# Patient Record
Sex: Female | Born: 1935 | Race: White | Hispanic: No | Marital: Married | Smoking: Never smoker
Health system: Southern US, Community
[De-identification: ages and names within clinical notes are randomized; demographics above are authoritative.]

## PROBLEM LIST (undated history)

## (undated) DIAGNOSIS — G8929 Other chronic pain: Secondary | ICD-10-CM

## (undated) DIAGNOSIS — M810 Age-related osteoporosis without current pathological fracture: Secondary | ICD-10-CM

## (undated) DIAGNOSIS — E785 Hyperlipidemia, unspecified: Secondary | ICD-10-CM

## (undated) DIAGNOSIS — E109 Type 1 diabetes mellitus without complications: Secondary | ICD-10-CM

## (undated) DIAGNOSIS — I6529 Occlusion and stenosis of unspecified carotid artery: Secondary | ICD-10-CM

## (undated) DIAGNOSIS — N183 Chronic kidney disease, stage 3 unspecified: Secondary | ICD-10-CM

## (undated) DIAGNOSIS — I2510 Atherosclerotic heart disease of native coronary artery without angina pectoris: Secondary | ICD-10-CM

## (undated) DIAGNOSIS — E039 Hypothyroidism, unspecified: Secondary | ICD-10-CM

## (undated) DIAGNOSIS — I10 Essential (primary) hypertension: Secondary | ICD-10-CM

## (undated) HISTORY — PX: TRIGGER FINGER RELEASE: SHX641

## (undated) HISTORY — PX: CORONARY ARTERY BYPASS GRAFT: SHX141

---

## 2017-09-16 ENCOUNTER — Ambulatory Visit (INDEPENDENT_AMBULATORY_CARE_PROVIDER_SITE_OTHER): Payer: Medicare Other | Admitting: Cardiovascular Disease

## 2017-09-16 ENCOUNTER — Encounter: Payer: Self-pay | Admitting: Cardiovascular Disease

## 2017-09-16 VITALS — BP 132/54 | HR 54 | Ht 62.5 in | Wt 121.0 lb

## 2017-09-16 DIAGNOSIS — E119 Type 2 diabetes mellitus without complications: Secondary | ICD-10-CM | POA: Diagnosis not present

## 2017-09-16 DIAGNOSIS — E7800 Pure hypercholesterolemia, unspecified: Secondary | ICD-10-CM

## 2017-09-16 DIAGNOSIS — Z794 Long term (current) use of insulin: Secondary | ICD-10-CM

## 2017-09-16 DIAGNOSIS — I2510 Atherosclerotic heart disease of native coronary artery without angina pectoris: Secondary | ICD-10-CM

## 2017-09-16 DIAGNOSIS — E785 Hyperlipidemia, unspecified: Secondary | ICD-10-CM | POA: Diagnosis not present

## 2017-09-16 DIAGNOSIS — I10 Essential (primary) hypertension: Secondary | ICD-10-CM | POA: Diagnosis not present

## 2017-09-16 DIAGNOSIS — E109 Type 1 diabetes mellitus without complications: Secondary | ICD-10-CM | POA: Insufficient documentation

## 2017-09-16 DIAGNOSIS — IMO0001 Reserved for inherently not codable concepts without codable children: Secondary | ICD-10-CM

## 2017-09-16 NOTE — Assessment & Plan Note (Signed)
She has CAD status post bypass grafting x325 years ago in [place] without problems since.  She denies chest pain or shortness of breath.

## 2017-09-16 NOTE — Assessment & Plan Note (Signed)
History of hyperlipidemia on statin therapy. We will recheck a lipid and liver profile 

## 2017-09-16 NOTE — Assessment & Plan Note (Signed)
History of essential hypertension blood pressure measured today 132/54.  Is on losartan, metoprolol and nifedipine.  Continue current meds at current dosing

## 2017-09-16 NOTE — Progress Notes (Signed)
09/16/2017 [name]   07-10-1929  [id number]  Primary Physician Robyne Peers, MD Primary Cardiologist: Lorretta Harp MD Lupe Carney, [place]  HPI:  Nancy Mcguire is a 84 y.o. mildly overweight married Caucasian female mother of 3 daughters, grandmother of 30 grandchildren is accompanied by her husband Nancy Mcguire.  She is self-referred to be established in my practice because of known CAD.  She has a history of treated hypertension, hyperlipidemia and diabetes on insulin pump.  She had bypass grafting x3 in [place] 25 years ago and has had no issues with that since.  She moved from [place] to [place] 5 years ago and now is here to be established.  She is completely asymptomatic.   Current Meds  Medication Sig  . acetaminophen (TYLENOL) 500 MG tablet Take 500 mg by mouth every 6 (six) hours as needed.  . calcium carbonate (CALCIUM 600) 600 MG TABS tablet Take 600 mg by mouth daily with breakfast.  . Cholecalciferol (VITAMIN D3 PO) Take 50 mcg by mouth daily.  . Cyanocobalamin (B-12) 1000 MCG/ML KIT Inject 1,000 Units as directed daily.  Marland Kitchen denosumab (PROLIA) 60 MG/ML SOSY injection Inject 60 mg into the skin every 6 (six) months.  . folic acid (FOLVITE) 644 MCG tablet Take 400 mcg by mouth daily.  Marland Kitchen gabapentin (NEURONTIN) 100 MG capsule Take 100 mg by mouth 2 (two) times daily.  Marland Kitchen levothyroxine (SYNTHROID, LEVOTHROID) 50 MCG tablet Take 50 mcg by mouth daily before breakfast.  . losartan (COZAAR) 100 MG tablet Take 100 mg by mouth daily.  . metoprolol tartrate (LOPRESSOR) 50 MG tablet Take 50 mg by mouth 2 (two) times daily.  Marland Kitchen NIFEdipine (PROCARDIA-XL/ADALAT CC) 60 MG 24 hr tablet Take 60 mg by mouth daily.  . simvastatin (ZOCOR) 20 MG tablet Take 20 mg by mouth daily.  . vitamin E 400 UNIT capsule Take 400 Units by mouth daily.     Allergies  Allergen Reactions  . Neomycin-Bacitracin Zn-Polymyx Other (See Comments)    Eye irritation Eye irritation   .  Hydrocodone-Acetaminophen Nausea And Vomiting    Social History   Socioeconomic History  . Marital status: Married    Spouse name: Not on file  . Number of children: Not on file  . Years of education: Not on file  . Highest education level: Not on file  Occupational History  . Not on file  Social Needs  . Financial resource strain: Not on file  . Food insecurity:    Worry: Not on file    Inability: Not on file  . Transportation needs:    Medical: Not on file    Non-medical: Not on file  Tobacco Use  . Smoking status: Not on file  Substance and Sexual Activity  . Alcohol use: Not on file  . Drug use: Not on file  . Sexual activity: Not on file  Lifestyle  . Physical activity:    Days per week: Not on file    Minutes per session: Not on file  . Stress: Not on file  Relationships  . Social connections:    Talks on phone: Not on file    Gets together: Not on file    Attends religious service: Not on file    Active member of club or organization: Not on file    Attends meetings of clubs or organizations: Not on file    Relationship status: Not on file  . Intimate partner violence:    Fear of  current or ex partner: Not on file    Emotionally abused: Not on file    Physically abused: Not on file    Forced sexual activity: Not on file  Other Topics Concern  . Not on file  Social History Narrative  . Not on file     Review of Systems: General: negative for chills, fever, night sweats or weight changes.  Cardiovascular: negative for chest pain, dyspnea on exertion, edema, orthopnea, palpitations, paroxysmal nocturnal dyspnea or shortness of breath Dermatological: negative for rash Respiratory: negative for cough or wheezing Urologic: negative for hematuria Abdominal: negative for nausea, vomiting, diarrhea, bright red blood per rectum, melena, or hematemesis Neurologic: negative for visual changes, syncope, or dizziness All other systems reviewed and are otherwise  negative except as noted above.    Blood pressure (!) 132/54, pulse (!) 54, height 5' 2.5" (1.588 m), weight 121 lb (54.9 kg).  General appearance: alert and no distress Neck: no adenopathy, no carotid bruit, no JVD, supple, symmetrical, trachea midline and thyroid not enlarged, symmetric, no tenderness/mass/nodules Lungs: clear to auscultation bilaterally Heart: regular rate and rhythm, S1, S2 normal, no murmur, click, rub or gallop Extremities: extremities normal, atraumatic, no cyanosis or edema Pulses: 2+ and symmetric Skin: Skin color, texture, turgor normal. No rashes or lesions Neurologic: Alert and oriented X 3, normal strength and tone. Normal symmetric reflexes. Normal coordination and gait  EKG sinus bradycardia 54 poor R wave progression.  I personally reviewed this EKG.  ASSESSMENT AND PLAN:   Hyperlipidemia History of hyperlipidemia on statin therapy.  We will recheck a lipid and liver profile  Essential hypertension History of essential hypertension blood pressure measured today 132/54.  Is on losartan, metoprolol and nifedipine.  Continue current meds at current dosing  Coronary artery disease She has CAD status post bypass grafting x325 years ago in [place] without problems since.  She denies chest pain or shortness of breath.      Lorretta Harp MD FACP,FACC,FAHA, [facility] 09/16/2017 12:55 PM

## 2017-09-16 NOTE — Patient Instructions (Signed)

## 2017-09-22 ENCOUNTER — Encounter: Payer: Self-pay | Admitting: *Deleted

## 2017-09-22 LAB — HEPATIC FUNCTION PANEL
ALT: 11 IU/L (ref 0–32)
AST: 15 IU/L (ref 0–40)
Albumin: 4.3 g/dL (ref 3.5–4.7)
Alkaline Phosphatase: 75 IU/L (ref 39–117)
Bilirubin Total: 0.4 mg/dL (ref 0.0–1.2)
Bilirubin, Direct: 0.14 mg/dL (ref 0.00–0.40)
Total Protein: 6.5 g/dL (ref 6.0–8.5)

## 2017-09-22 LAB — LIPID PANEL
CHOL/HDL RATIO: 2.1 ratio (ref 0.0–4.4)
CHOLESTEROL TOTAL: 151 mg/dL (ref 100–199)
HDL: 73 mg/dL (ref >39)
LDL Calculated: 67 mg/dL (ref 0–99)
TRIGLYCERIDES: 53 mg/dL (ref 0–149)
VLDL Cholesterol Cal: 11 mg/dL (ref 5–40)

## 2018-08-30 ENCOUNTER — Telehealth: Payer: Self-pay | Admitting: *Deleted

## 2018-08-30 NOTE — Telephone Encounter (Signed)
Mrs. Nancy Mcguire, sheerin call back later.

## 2018-12-06 ENCOUNTER — Telehealth: Payer: Self-pay | Admitting: Cardiovascular Disease

## 2018-12-06 NOTE — Telephone Encounter (Signed)
New Message   Patient's husband Juanda Crumble) calling in to get approval to accompany patient to the appointment on 12/08/18 with Dr Gwenlyn Found. Please give patient a call back to confirm.

## 2018-12-08 ENCOUNTER — Ambulatory Visit: Payer: Medicare Other | Admitting: Cardiovascular Disease

## 2019-01-04 ENCOUNTER — Other Ambulatory Visit: Payer: Self-pay

## 2019-01-04 ENCOUNTER — Encounter: Payer: Self-pay | Admitting: Cardiovascular Disease

## 2019-01-04 ENCOUNTER — Ambulatory Visit (INDEPENDENT_AMBULATORY_CARE_PROVIDER_SITE_OTHER): Payer: Medicare Other | Admitting: Cardiovascular Disease

## 2019-01-04 DIAGNOSIS — I2510 Atherosclerotic heart disease of native coronary artery without angina pectoris: Secondary | ICD-10-CM | POA: Diagnosis not present

## 2019-01-04 DIAGNOSIS — R0989 Other specified symptoms and signs involving the circulatory and respiratory systems: Secondary | ICD-10-CM | POA: Insufficient documentation

## 2019-01-04 DIAGNOSIS — R011 Cardiac murmur, unspecified: Secondary | ICD-10-CM | POA: Insufficient documentation

## 2019-01-04 DIAGNOSIS — I10 Essential (primary) hypertension: Secondary | ICD-10-CM

## 2019-01-04 DIAGNOSIS — E782 Mixed hyperlipidemia: Secondary | ICD-10-CM | POA: Diagnosis not present

## 2019-01-04 NOTE — Patient Instructions (Addendum)
Medication Instructions:  Your physician recommends that you continue on your current medications as directed. Please refer to the Current Medication list given to you today.  If you need a refill on your cardiac medications before your next appointment, please call your pharmacy.   Lab work: Your physician recommends that you return for lab work WITHIN 1 WEEK: FASTING LAB WORK  If you have labs (blood work) drawn today and your tests are completely normal, you will receive your results only by: Marland Kitchen MyChart Message (if you have MyChart) OR . A paper copy in the mail If you have any lab test that is abnormal or we need to change your treatment, we will call you to review the results.  Testing/Procedures: Your physician has requested that you have an echocardiogram. Echocardiography is a painless test that uses sound waves to create images of your heart. It provides your doctor with information about the size and shape of your heart and how well your heart's chambers and valves are working. This procedure takes approximately one hour. There are no restrictions for this procedure. LOCATION: [facility]: [place], [place], [place] [address] TO BE SCHEDULED   Follow-Up: At [facility], you and your health needs are our priority.  As part of our continuing mission to provide you with exceptional heart care, we have created designated Provider Care Teams.  These Care Teams include your primary Cardiologist (physician) and Advanced Practice Providers (APPs -  Physician Assistants and Nurse Practitioners) who all work together to provide you with the care you need, when you need it. You will need a follow up appointment in 12 months with Dr. Quay Burow.  Please call our office 2 months in advance to schedule this/each appointment.

## 2019-01-04 NOTE — Assessment & Plan Note (Signed)
History of CAD status post coronary artery bypass grafting over 25 years ago in [place].  She remains asymptomatic.

## 2019-01-04 NOTE — Assessment & Plan Note (Signed)
Loud outflow tract murmur.  Will check 2D echocardiogram

## 2019-01-04 NOTE — Progress Notes (Signed)
01/04/2019 Alaa Mullally   05/21/1929  [id number]  Primary Physician Robyne Peers, MD Primary Cardiologist: Lorretta Harp MD Lupe Carney, [place]  HPI:  Trenesha Alcaide is a 85 y.o.   mildly overweight married Caucasian female mother of 3 daughters, grandmother of 33 grandchildren is accompanied by her husband Iona Beard who is also a patient of mine. She was self-referred to be established in my practice because of known CAD.  I last saw her in the office 09/16/2017. She has a history of treated hypertension, hyperlipidemia and diabetes on insulin pump.  She had bypass grafting x3 in [place] 25 years ago and has had no issues with that since.  She moved from [place] to [place] 5 years ago and now is here to be established.  She is completely asymptomatic.  Since I saw her a year ago she is remained asymptomatic.  She gets her carotid Dopplers performed at [facility] which have been essentially normal last checked 09/21/2018.  She denies chest pain or shortness of breath.  Better   Current Meds  Medication Sig  . acetaminophen (TYLENOL) 500 MG tablet Take 500 mg by mouth every 6 (six) hours as needed.  . calcium carbonate (CALCIUM 600) 600 MG TABS tablet Take 600 mg by mouth daily with breakfast.  . Cholecalciferol (VITAMIN D3 PO) Take 50 mcg by mouth daily.  . cyanocobalamin (,VITAMIN B-12,) 1000 MCG/ML injection INJECT MONTHLY  . denosumab (PROLIA) 60 MG/ML SOSY injection Inject 60 mg into the skin every 6 (six) months.  . folic acid (FOLVITE) 409 MCG tablet Take 400 mcg by mouth daily.  Marland Kitchen gabapentin (NEURONTIN) 100 MG capsule Take 100 mg by mouth 2 (two) times daily.  . insulin lispro (HUMALOG) 100 UNIT/ML injection USE AS DIRECTED IN INSULIN PUMP. MDD=50 UNITS  . levothyroxine (SYNTHROID, LEVOTHROID) 50 MCG tablet Take 50 mcg by mouth daily before breakfast.  . losartan (COZAAR) 100 MG tablet Take 100 mg by mouth daily.  . metoprolol tartrate (LOPRESSOR) 50  MG tablet Take 50 mg by mouth 2 (two) times daily.  Marland Kitchen NIFEdipine (PROCARDIA-XL/ADALAT CC) 60 MG 24 hr tablet Take 60 mg by mouth daily.  . simvastatin (ZOCOR) 20 MG tablet Take 20 mg by mouth daily.  . vitamin E 400 UNIT capsule Take 400 Units by mouth daily.     Allergies  Allergen Reactions  . Neomycin-Bacitracin Zn-Polymyx Other (See Comments)    Eye irritation Eye irritation   . Hydrocodone-Acetaminophen Nausea And Vomiting    Social History   Socioeconomic History  . Marital status: Married    Spouse name: Not on file  . Number of children: Not on file  . Years of education: Not on file  . Highest education level: Not on file  Occupational History  . Not on file  Social Needs  . Financial resource strain: Not on file  . Food insecurity    Worry: Not on file    Inability: Not on file  . Transportation needs    Medical: Not on file    Non-medical: Not on file  Tobacco Use  . Smoking status: Never Smoker  . Smokeless tobacco: Never Used  Substance and Sexual Activity  . Alcohol use: Yes  . Drug use: Not on file  . Sexual activity: Not on file  Lifestyle  . Physical activity    Days per week: Not on file    Minutes per session: Not on file  . Stress: Not on  file  Relationships  . Social Musician on phone: Not on file    Gets together: Not on file    Attends religious service: Not on file    Active member of club or organization: Not on file    Attends meetings of clubs or organizations: Not on file    Relationship status: Not on file  . Intimate partner violence    Fear of current or ex partner: Not on file    Emotionally abused: Not on file    Physically abused: Not on file    Forced sexual activity: Not on file  Other Topics Concern  . Not on file  Social History Narrative  . Not on file     Review of Systems: General: negative for chills, fever, night sweats or weight changes.  Cardiovascular: negative for chest pain, dyspnea on  exertion, edema, orthopnea, palpitations, paroxysmal nocturnal dyspnea or shortness of breath Dermatological: negative for rash Respiratory: negative for cough or wheezing Urologic: negative for hematuria Abdominal: negative for nausea, vomiting, diarrhea, bright red blood per rectum, melena, or hematemesis Neurologic: negative for visual changes, syncope, or dizziness All other systems reviewed and are otherwise negative except as noted above.    Blood pressure (!) 166/75, pulse 63, height 5' 2.5" (1.588 m), weight 120 lb 9.6 oz (54.7 kg), SpO2 98 %.  General appearance: alert and no distress Neck: no adenopathy, no JVD, supple, symmetrical, trachea midline, thyroid not enlarged, symmetric, no tenderness/mass/nodules and Soft bilateral carotid bruits Lungs: clear to auscultation bilaterally Heart: 3/6 systolic ejection murmur at the base consistent with aortic stenosis and/or sclerosis. Extremities: extremities normal, atraumatic, no cyanosis or edema Pulses: 2+ and symmetric Skin: Skin color, texture, turgor normal. No rashes or lesions Neurologic: Alert and oriented X 3, normal strength and tone. Normal symmetric reflexes. Normal coordination and gait  EKG sinus bradycardia 59 with nonspecific ST and T wave changes.  I personally reviewed this EKG.  ASSESSMENT AND PLAN:   Hyperlipidemia History of hyperlipidemia on statin therapy.  We will recheck a lipid liver profile.  Essential hypertension History of essential hypertension with blood pressure measured today at 166/75 but she says this is "whitecoat hypertension since usually her blood pressure is much better than this.  Repeat readings was 140/68.Marland Kitchen  She is on losartan and metoprolol.  Coronary artery disease History of CAD status post coronary artery bypass grafting over 25 years ago in [place].  She remains asymptomatic.  Cardiac murmur Loud outflow tract murmur.  Will check 2D echocardiogram  Carotid bruit Bilateral  carotid bruits with recent carotid Dopplers performed 09/21/2018 that were essentially normal      Runell Gess MD [facility], [facility] 01/04/2019 11:41 AM

## 2019-01-04 NOTE — Assessment & Plan Note (Signed)
History of hyperlipidemia on statin therapy.  We will recheck a lipid liver profile 

## 2019-01-04 NOTE — Assessment & Plan Note (Addendum)
History of essential hypertension with blood pressure measured today at 166/75 but she says this is "whitecoat hypertension since usually her blood pressure is much better than this.  Repeat readings was 140/68.Marland Kitchen  She is on losartan and metoprolol.

## 2019-01-04 NOTE — Assessment & Plan Note (Signed)
Bilateral carotid bruits with recent carotid Dopplers performed 09/21/2018 that were essentially normal

## 2019-01-06 LAB — HEPATIC FUNCTION PANEL
ALT: 19 IU/L (ref 0–32)
AST: 24 IU/L (ref 0–40)
Albumin: 4.3 g/dL (ref 3.6–4.6)
Alkaline Phosphatase: 110 IU/L (ref 39–117)
Bilirubin Total: 0.5 mg/dL (ref 0.0–1.2)
Bilirubin, Direct: 0.19 mg/dL (ref 0.00–0.40)
Total Protein: 6.8 g/dL (ref 6.0–8.5)

## 2019-01-06 LAB — LIPID PANEL
Chol/HDL Ratio: 1.9 ratio (ref 0.0–4.4)
Cholesterol, Total: 139 mg/dL (ref 100–199)
HDL: 72 mg/dL (ref >39)
LDL Chol Calc (NIH): 54 mg/dL (ref 0–99)
Triglycerides: 62 mg/dL (ref 0–149)
VLDL Cholesterol Cal: 13 mg/dL (ref 5–40)

## 2019-01-07 ENCOUNTER — Encounter: Payer: Self-pay | Admitting: *Deleted

## 2019-01-13 ENCOUNTER — Ambulatory Visit (HOSPITAL_COMMUNITY): Payer: Medicare Other | Attending: Cardiology

## 2019-01-13 ENCOUNTER — Other Ambulatory Visit: Payer: Self-pay

## 2019-01-13 DIAGNOSIS — R011 Cardiac murmur, unspecified: Secondary | ICD-10-CM | POA: Insufficient documentation

## 2019-08-08 ENCOUNTER — Telehealth: Payer: Self-pay | Admitting: Cardiovascular Disease

## 2019-08-08 NOTE — Telephone Encounter (Signed)
Pt c/o Shortness Of Breath: STAT if SOB developed within the last 24 hours or pt is noticeably SOB on the phone  1. Are you currently SOB (can you hear that pt is SOB on the phone)? no  2. How long have you been experiencing SOB? Since Saturday Morning   3. Are you SOB when sitting or when up moving around? Both, but not constant SOB  4. Are you currently experiencing any other symptoms? Irregular HR. Fluctuated between 40-50. Husband did not keep an exact record  Husband called and wanted to know if the patient could be seen Today. Made the wife an appointment for tomorrow

## 2019-08-08 NOTE — Telephone Encounter (Signed)
Spoke with husband and started Saturday morning Shortness of breath off and on, denies chest pain  Advised to take it easy today and keep visit tomorrow, if worse go to ED

## 2019-08-08 NOTE — Progress Notes (Addendum)
Cardiology Clinic Note   Patient Name: Nancy Mcguire Date of Encounter: 08/09/2019  Primary Care Provider:  Robyne Peers, MD Primary Cardiologist:  Quay Burow, MD  Patient Profile    Nancy Mcguire 86 year old female presents today for follow-up evaluation of her essential hypertension, coronary artery disease, diabetes, hyperlipidemia, and cardiac murmur.  Past Medical History    History reviewed. No pertinent past medical history. History reviewed. No pertinent surgical history.  Allergies  Allergies  Allergen Reactions  . Neomycin-Bacitracin Zn-Polymyx Other (See Comments)    Eye irritation Eye irritation   . Hydrocodone-Acetaminophen Nausea And Vomiting    History of Present Illness    Nancy Mcguire has a past medical history of coronary artery disease, HTN, HLD, and diabetes on insulin pump.  She underwent CABG x3 in [place] 26 years ago and has not had recurrent issues since.  She and her husband moved from [place] to [place] 6 years ago.  She was last seen by Dr. Gwenlyn Found on 01/04/2019 and was completely asymptomatic at that time.  She underwent carotid Dopplers not [facility] on 09/21/2018 which were normal.  She denied chest pain and shortness of breath.  Her husband contacted the nurse triage line on 08/08/2019 and indicated that she had episodes of shortness of breath.  She had noticed the intermittent periods since Saturday.  She presents the clinic today with her husband states she has noticed intermittent periods of chest pressure and shortness of breath.  Notices pressure at rest and is relieved by rest.   Started on Saturday morning.  She uses a Rollator for walking and chest pain is reproduced with deep inspiration and palpation.  This seems to be related to chest wall pain.  She denies acute injury or sick contacts.  States she does have a intermittent cough.  I have instructed her to rest the area, take Tylenol for pain, and follow-up with her  PCP if chest wall pain does not improve in the next 7 to 10 days.  Follow-up with Dr. Gwenlyn Found in September.  Today she denies increased shortness of breath, lower extremity edema, fatigue, palpitations, melena, hematuria, hemoptysis, diaphoresis, weakness, presyncope, syncope, orthopnea, and PND.   Home Medications    Prior to Admission medications   Medication Sig Start Date End Date Taking? Authorizing Provider  acetaminophen (TYLENOL) 500 MG tablet Take 500 mg by mouth every 6 (six) hours as needed.    [provider]  calcium carbonate (CALCIUM 600) 600 MG TABS tablet Take 600 mg by mouth daily with breakfast.    [provider]  Cholecalciferol (VITAMIN D3 PO) Take 50 mcg by mouth daily.    [provider]  cyanocobalamin (,VITAMIN B-12,) 1000 MCG/ML injection INJECT MONTHLY 09/30/18   [provider]  denosumab (PROLIA) 60 MG/ML SOSY injection Inject 60 mg into the skin every 6 (six) months.    [provider]  folic acid (FOLVITE) 009 MCG tablet Take 400 mcg by mouth daily.    [provider]  gabapentin (NEURONTIN) 100 MG capsule Take 100 mg by mouth 2 (two) times daily.    [provider]  insulin lispro (HUMALOG) 100 UNIT/ML injection USE AS DIRECTED IN INSULIN PUMP. MDD=50 UNITS 05/19/18   [provider]  levothyroxine (SYNTHROID, LEVOTHROID) 50 MCG tablet Take 50 mcg by mouth daily before breakfast.    [provider]  losartan (COZAAR) 100 MG tablet Take 100 mg by mouth daily.    [provider]  metoprolol  tartrate (LOPRESSOR) 50 MG tablet Take 50 mg by mouth 2 (two) times daily.    [provider]  NIFEdipine (PROCARDIA-XL/ADALAT CC) 60 MG 24 hr tablet Take 60 mg by mouth daily.    [provider]  simvastatin (ZOCOR) 20 MG tablet Take 20 mg by mouth daily.    [provider]  vitamin E 400 UNIT capsule Take 400 Units by mouth daily.    [provider]     Family History    History reviewed. No pertinent family history. has no family status information on file.   Social History    Social History   Socioeconomic History  . Marital status: Married    Spouse name: Not on file  . Number of children: Not on file  . Years of education: Not on file  . Highest education level: Not on file  Occupational History  . Not on file  Tobacco Use  . Smoking status: Never Smoker  . Smokeless tobacco: Never Used  Substance and Sexual Activity  . Alcohol use: Yes  . Drug use: Not on file  . Sexual activity: Not on file  Other Topics Concern  . Not on file  Social History Narrative  . Not on file   Social Determinants of Health   Financial Resource Strain:   . Difficulty of Paying Living Expenses:   Food Insecurity:   . Worried About Programme researcher, broadcasting/film/video in the Last Year:   . Barista in the Last Year:   Transportation Needs:   . Freight forwarder (Medical):   Marland Kitchen Lack of Transportation (Non-Medical):   Physical Activity:   . Days of Exercise per Week:   . Minutes of Exercise per Session:   Stress:   . Feeling of Stress :   Social Connections:   . Frequency of Communication with Friends and Family:   . Frequency of Social Gatherings with Friends and Family:   . Attends Religious Services:   . Active Member of Clubs or Organizations:   . Attends Banker Meetings:   Marland Kitchen Marital Status:   Intimate Partner Violence:   . Fear of Current or Ex-Partner:   . Emotionally Abused:   Marland Kitchen Physically Abused:   . Sexually Abused:      Review of Systems    General:  No chills, fever, night sweats or weight changes.  Cardiovascular:  No chest pain, dyspnea on exertion, edema, orthopnea, palpitations, paroxysmal nocturnal dyspnea. Dermatological: No rash, lesions/masses Respiratory: No cough, dyspnea Urologic: No hematuria, dysuria Abdominal:   No nausea, vomiting, diarrhea, bright red blood per rectum, melena, or  hematemesis Neurologic:  No visual changes, wkns, changes in mental status. All other systems reviewed and are otherwise negative except as noted above.  Physical Exam    VS:  BP (!) 150/66 (BP Location: Left Arm, Patient Position: Sitting, Cuff Size: Normal)   Pulse (!) 55   Ht 5\' 2"  (1.575 m)   Wt 120 lb 12.8 oz (54.8 kg)   SpO2 97%   BMI 22.09 kg/m  , BMI Body mass index is 22.09 kg/m. GEN: Well nourished, well developed, in no acute distress. HEENT: normal. Neck: Supple, no JVD, carotid bruits, or masses. Cardiac: RRR, no murmurs, rubs, or gallops. No clubbing, cyanosis, edema.  Radials/DP/PT 2+ and equal bilaterally.  Respiratory:  Respirations regular and unlabored, clear to auscultation bilaterally. GI: Soft, nontender, nondistended, BS + x 4. MS: no deformity or atrophy.  Tenderness with deep  palpation over left pectoral/axillary region. Skin: warm and dry, no rash. Neuro:  Strength and sensation are intact. Psych: Normal affect.  Accessory Clinical Findings    ECG personally reviewed by me today-sinus bradycardia  inferior infarct undetermined age, anterior infarct undetermined age 67 bpm- No acute changes  EKG 01/04/2019 Sinus bradycardia 59 bpm nonspecific ST and T wave changes  Echocardiogram 01/13/2019  IMPRESSIONS    1. Left ventricular ejection fraction, by visual estimation, is 60 to  65%. The left ventricle has normal function. Normal left ventricular size.  There is no left ventricular hypertrophy.  2. Elevated left atrial and left ventricular end-diastolic pressures.  3. Left ventricular diastolic Doppler parameters are consistent with  pseudonormalization pattern of LV diastolic filling.  4. Global right ventricle has normal systolic function.The right  ventricular size is normal. No increase in right ventricular wall  thickness.  5. Left atrial size was mild-moderately dilated.  6. Right atrial size was severely dilated.  7. Moderate mitral  annular calcification.  8. Moderate thickening of the mitral valve leaflet(s).  9. The mitral valve is degenerative. Mild mitral valve regurgitation. No  evidence of mitral stenosis.  10. The tricuspid valve is normal in structure. Tricuspid valve  regurgitation mild-moderate.  11. The aortic valve is tricuspid Aortic valve regurgitation is trivial by  color flow Doppler. Severe aortic valve stenosis.  12. The pulmonic valve was normal in structure. Pulmonic valve  regurgitation is trivial by color flow Doppler.  13. Moderately to severlyelevated pulmonary artery systolic pressure.  14. The inferior vena cava is dilated in size with >50% respiratory  variability, suggesting right atrial pressure of 8 mmHg.  Assessment & Plan   1.  Chest wall pain-pain reproduced with deep inspiration and deep palpation.  Pain along the left chest.  Shortness of breath-increased work of breathing started intermittently on Saturday.  Was relieved by rest.  Echocardiogram 01/2019 showed normal LV function, biatrial enlargement, and moderate pulmonary hypertension.  Denies cough, fever, sick contacts.  This is secondary to chest wall discomfort. Instructed to rest the area Cannot walk with Rollator on uneven surfaces Continue to monitor Follow-up with PCP if not better in 7 to 10 days.  Essential hypertension-BP today 150/66. White coat syndrome. Well controlled at home.  Continue losartan 100 mg daily Continue metoprolol tartrate 50 mg twice daily Continue nifedipine 60 mg daily Heart healthy low-sodium diet-salty 6 given Increase physical activity as tolerated Blood pressure log given  Hyperlipidemia-LDL 54 01/06/2019 Continue statin Heart healthy low-sodium high-fiber diet Increase physical activity as tolerated  Cardiac murmur-systolic murmur heard along left sternal border.  Echocardiogram 02/01/2019 showed mild mitral valve regurgitation, trivial aortic valve regurgitation Continue to  monitor  Disposition: Follow-up with Dr. Allyson Sabal in September.  Thomasene Ripple. Marvelle Span NP-C    08/09/2019, 11:30 AM [facility] [address] Office [phone] Fax [phone]

## 2019-08-09 ENCOUNTER — Ambulatory Visit (INDEPENDENT_AMBULATORY_CARE_PROVIDER_SITE_OTHER): Payer: Medicare Other | Admitting: General Practice

## 2019-08-09 ENCOUNTER — Encounter: Payer: Self-pay | Admitting: General Practice

## 2019-08-09 ENCOUNTER — Other Ambulatory Visit: Payer: Self-pay

## 2019-08-09 VITALS — BP 150/66 | HR 55 | Ht 62.0 in | Wt 120.8 lb

## 2019-08-09 DIAGNOSIS — E782 Mixed hyperlipidemia: Secondary | ICD-10-CM

## 2019-08-09 DIAGNOSIS — E785 Hyperlipidemia, unspecified: Secondary | ICD-10-CM | POA: Diagnosis not present

## 2019-08-09 DIAGNOSIS — R0789 Other chest pain: Secondary | ICD-10-CM

## 2019-08-09 DIAGNOSIS — I10 Essential (primary) hypertension: Secondary | ICD-10-CM

## 2019-08-09 DIAGNOSIS — R011 Cardiac murmur, unspecified: Secondary | ICD-10-CM | POA: Diagnosis not present

## 2019-08-09 DIAGNOSIS — R0602 Shortness of breath: Secondary | ICD-10-CM

## 2019-08-09 NOTE — Patient Instructions (Addendum)
Medication Instructions:  The current medical regimen is effective;  continue present plan and medications as directed. Please refer to the Current Medication list given to you today. *If you need a refill on your cardiac medications before your next appointment, please call your pharmacy*   Special Instructions TAKE AND LOG YOUR BLOOD PRESSURE DAILY-AT DIFFERENT TIMES AT LEAST 1 HOUR AFTER TAKING MEDICATIONS  PLEASE READ AND FOLLOW SALTY 6-ATTACHED  YOU MAY TAKE TYLENOL FOR YOUR PAIN IN YOUR CHEST-REST THE AREA FOR 7-10 DAYS. IF YOU ARE STILL HAVE THIS PAIN IN YOUR CHEST IN 2 WEEKS CALL YOUR PRIMARY DOCTOR TO DISCUSS.  Follow-Up: Your next appointment:  5 month(s) Please call our office 2 months in advance(JULY) to schedule this(SEPTEMBER) appointment In Person with Nanetta Batty, MD  At [facility], you and your health needs are our priority.  As part of our continuing mission to provide you with exceptional heart care, we have created designated Provider Care Teams.  These Care Teams include your primary Cardiologist (physician) and Advanced Practice Providers (APPs -  Physician Assistants and Nurse Practitioners) who all work together to provide you with the care you need, when you need it.  We recommend signing up for the patient portal called "MyChart".  Sign up information is provided on this After Visit Summary.  MyChart is used to connect with patients for Virtual Visits (Telemedicine).  Patients are able to view lab/test results, encounter notes, upcoming appointments, etc.  Non-urgent messages can be sent to your provider as well.   To learn more about what you can do with MyChart, go to [email or web address].

## 2019-08-29 ENCOUNTER — Encounter (HOSPITAL_COMMUNITY): Payer: Self-pay

## 2019-08-29 ENCOUNTER — Other Ambulatory Visit: Payer: Self-pay

## 2019-08-29 ENCOUNTER — Emergency Department (HOSPITAL_COMMUNITY): Payer: Medicare Other

## 2019-08-29 ENCOUNTER — Inpatient Hospital Stay (HOSPITAL_COMMUNITY)
Admission: EM | Admit: 2019-08-29 | Discharge: 2019-10-13 | DRG: 319 | Disposition: E | Payer: Medicare Other | Attending: Internal Medicine | Admitting: Internal Medicine

## 2019-08-29 DIAGNOSIS — E039 Hypothyroidism, unspecified: Secondary | ICD-10-CM | POA: Diagnosis present

## 2019-08-29 DIAGNOSIS — R0600 Dyspnea, unspecified: Secondary | ICD-10-CM | POA: Diagnosis present

## 2019-08-29 DIAGNOSIS — K5900 Constipation, unspecified: Secondary | ICD-10-CM | POA: Diagnosis present

## 2019-08-29 DIAGNOSIS — I700 Atherosclerosis of aorta: Secondary | ICD-10-CM | POA: Diagnosis present

## 2019-08-29 DIAGNOSIS — E871 Hypo-osmolality and hyponatremia: Secondary | ICD-10-CM | POA: Diagnosis not present

## 2019-08-29 DIAGNOSIS — E10649 Type 1 diabetes mellitus with hypoglycemia without coma: Secondary | ICD-10-CM | POA: Diagnosis not present

## 2019-08-29 DIAGNOSIS — E782 Mixed hyperlipidemia: Secondary | ICD-10-CM | POA: Diagnosis not present

## 2019-08-29 DIAGNOSIS — E1065 Type 1 diabetes mellitus with hyperglycemia: Secondary | ICD-10-CM | POA: Diagnosis present

## 2019-08-29 DIAGNOSIS — D631 Anemia in chronic kidney disease: Secondary | ICD-10-CM | POA: Diagnosis present

## 2019-08-29 DIAGNOSIS — I5033 Acute on chronic diastolic (congestive) heart failure: Secondary | ICD-10-CM | POA: Diagnosis not present

## 2019-08-29 DIAGNOSIS — E872 Acidosis: Secondary | ICD-10-CM | POA: Diagnosis not present

## 2019-08-29 DIAGNOSIS — R0603 Acute respiratory distress: Secondary | ICD-10-CM

## 2019-08-29 DIAGNOSIS — E785 Hyperlipidemia, unspecified: Secondary | ICD-10-CM | POA: Diagnosis present

## 2019-08-29 DIAGNOSIS — I10 Essential (primary) hypertension: Secondary | ICD-10-CM | POA: Diagnosis present

## 2019-08-29 DIAGNOSIS — Z20822 Contact with and (suspected) exposure to covid-19: Secondary | ICD-10-CM | POA: Diagnosis present

## 2019-08-29 DIAGNOSIS — J810 Acute pulmonary edema: Secondary | ICD-10-CM | POA: Diagnosis not present

## 2019-08-29 DIAGNOSIS — E8779 Other fluid overload: Secondary | ICD-10-CM

## 2019-08-29 DIAGNOSIS — I25119 Atherosclerotic heart disease of native coronary artery with unspecified angina pectoris: Secondary | ICD-10-CM | POA: Diagnosis present

## 2019-08-29 DIAGNOSIS — J9621 Acute and chronic respiratory failure with hypoxia: Secondary | ICD-10-CM | POA: Diagnosis not present

## 2019-08-29 DIAGNOSIS — I130 Hypertensive heart and chronic kidney disease with heart failure and stage 1 through stage 4 chronic kidney disease, or unspecified chronic kidney disease: Secondary | ICD-10-CM | POA: Diagnosis not present

## 2019-08-29 DIAGNOSIS — R011 Cardiac murmur, unspecified: Secondary | ICD-10-CM | POA: Diagnosis not present

## 2019-08-29 DIAGNOSIS — E876 Hypokalemia: Secondary | ICD-10-CM | POA: Diagnosis present

## 2019-08-29 DIAGNOSIS — J9601 Acute respiratory failure with hypoxia: Secondary | ICD-10-CM | POA: Diagnosis not present

## 2019-08-29 DIAGNOSIS — E1022 Type 1 diabetes mellitus with diabetic chronic kidney disease: Secondary | ICD-10-CM | POA: Diagnosis present

## 2019-08-29 DIAGNOSIS — Z883 Allergy status to other anti-infective agents status: Secondary | ICD-10-CM

## 2019-08-29 DIAGNOSIS — B9620 Unspecified Escherichia coli [E. coli] as the cause of diseases classified elsewhere: Secondary | ICD-10-CM | POA: Diagnosis present

## 2019-08-29 DIAGNOSIS — J9811 Atelectasis: Secondary | ICD-10-CM | POA: Diagnosis not present

## 2019-08-29 DIAGNOSIS — Z01810 Encounter for preprocedural cardiovascular examination: Secondary | ICD-10-CM | POA: Diagnosis not present

## 2019-08-29 DIAGNOSIS — N183 Chronic kidney disease, stage 3 unspecified: Secondary | ICD-10-CM

## 2019-08-29 DIAGNOSIS — R000 Tachycardia, unspecified: Secondary | ICD-10-CM | POA: Diagnosis not present

## 2019-08-29 DIAGNOSIS — M810 Age-related osteoporosis without current pathological fracture: Secondary | ICD-10-CM | POA: Diagnosis present

## 2019-08-29 DIAGNOSIS — R0609 Other forms of dyspnea: Secondary | ICD-10-CM | POA: Insufficient documentation

## 2019-08-29 DIAGNOSIS — Z79890 Hormone replacement therapy (postmenopausal): Secondary | ICD-10-CM

## 2019-08-29 DIAGNOSIS — N1831 Chronic kidney disease, stage 3a: Secondary | ICD-10-CM | POA: Diagnosis present

## 2019-08-29 DIAGNOSIS — Z951 Presence of aortocoronary bypass graft: Secondary | ICD-10-CM

## 2019-08-29 DIAGNOSIS — R079 Chest pain, unspecified: Secondary | ICD-10-CM | POA: Diagnosis not present

## 2019-08-29 DIAGNOSIS — Z66 Do not resuscitate: Secondary | ICD-10-CM | POA: Diagnosis present

## 2019-08-29 DIAGNOSIS — Z006 Encounter for examination for normal comparison and control in clinical research program: Secondary | ICD-10-CM | POA: Diagnosis not present

## 2019-08-29 DIAGNOSIS — I272 Pulmonary hypertension, unspecified: Secondary | ICD-10-CM | POA: Diagnosis not present

## 2019-08-29 DIAGNOSIS — I350 Nonrheumatic aortic (valve) stenosis: Secondary | ICD-10-CM | POA: Diagnosis not present

## 2019-08-29 DIAGNOSIS — R001 Bradycardia, unspecified: Secondary | ICD-10-CM | POA: Diagnosis present

## 2019-08-29 DIAGNOSIS — N390 Urinary tract infection, site not specified: Secondary | ICD-10-CM | POA: Diagnosis not present

## 2019-08-29 DIAGNOSIS — R54 Age-related physical debility: Secondary | ICD-10-CM | POA: Diagnosis present

## 2019-08-29 DIAGNOSIS — R7989 Other specified abnormal findings of blood chemistry: Secondary | ICD-10-CM | POA: Diagnosis present

## 2019-08-29 DIAGNOSIS — N179 Acute kidney failure, unspecified: Secondary | ICD-10-CM | POA: Diagnosis not present

## 2019-08-29 DIAGNOSIS — R0902 Hypoxemia: Secondary | ICD-10-CM

## 2019-08-29 DIAGNOSIS — I25118 Atherosclerotic heart disease of native coronary artery with other forms of angina pectoris: Secondary | ICD-10-CM | POA: Diagnosis not present

## 2019-08-29 DIAGNOSIS — Z952 Presence of prosthetic heart valve: Secondary | ICD-10-CM | POA: Diagnosis not present

## 2019-08-29 DIAGNOSIS — I959 Hypotension, unspecified: Secondary | ICD-10-CM | POA: Diagnosis not present

## 2019-08-29 DIAGNOSIS — T508X5A Adverse effect of diagnostic agents, initial encounter: Secondary | ICD-10-CM | POA: Diagnosis not present

## 2019-08-29 DIAGNOSIS — Z79899 Other long term (current) drug therapy: Secondary | ICD-10-CM

## 2019-08-29 DIAGNOSIS — E8770 Fluid overload, unspecified: Secondary | ICD-10-CM | POA: Diagnosis present

## 2019-08-29 DIAGNOSIS — Z794 Long term (current) use of insulin: Secondary | ICD-10-CM

## 2019-08-29 DIAGNOSIS — Z7289 Other problems related to lifestyle: Secondary | ICD-10-CM

## 2019-08-29 DIAGNOSIS — I25810 Atherosclerosis of coronary artery bypass graft(s) without angina pectoris: Secondary | ICD-10-CM | POA: Diagnosis not present

## 2019-08-29 DIAGNOSIS — I5031 Acute diastolic (congestive) heart failure: Secondary | ICD-10-CM | POA: Diagnosis present

## 2019-08-29 DIAGNOSIS — Z01818 Encounter for other preprocedural examination: Secondary | ICD-10-CM

## 2019-08-29 DIAGNOSIS — G8929 Other chronic pain: Secondary | ICD-10-CM | POA: Diagnosis present

## 2019-08-29 DIAGNOSIS — R34 Anuria and oliguria: Secondary | ICD-10-CM | POA: Diagnosis not present

## 2019-08-29 DIAGNOSIS — Z885 Allergy status to narcotic agent status: Secondary | ICD-10-CM

## 2019-08-29 DIAGNOSIS — Z9641 Presence of insulin pump (external) (internal): Secondary | ICD-10-CM | POA: Diagnosis present

## 2019-08-29 DIAGNOSIS — I340 Nonrheumatic mitral (valve) insufficiency: Secondary | ICD-10-CM | POA: Diagnosis not present

## 2019-08-29 DIAGNOSIS — I2729 Other secondary pulmonary hypertension: Secondary | ICD-10-CM | POA: Diagnosis present

## 2019-08-29 DIAGNOSIS — E109 Type 1 diabetes mellitus without complications: Secondary | ICD-10-CM | POA: Diagnosis present

## 2019-08-29 DIAGNOSIS — N141 Nephropathy induced by other drugs, medicaments and biological substances: Secondary | ICD-10-CM | POA: Diagnosis not present

## 2019-08-29 DIAGNOSIS — I2510 Atherosclerotic heart disease of native coronary artery without angina pectoris: Secondary | ICD-10-CM | POA: Diagnosis present

## 2019-08-29 HISTORY — DX: Essential (primary) hypertension: I10

## 2019-08-29 HISTORY — DX: Other chronic pain: G89.29

## 2019-08-29 HISTORY — DX: Chronic kidney disease, stage 3 unspecified: N18.30

## 2019-08-29 HISTORY — DX: Type 1 diabetes mellitus without complications: E10.9

## 2019-08-29 HISTORY — DX: Age-related osteoporosis without current pathological fracture: M81.0

## 2019-08-29 HISTORY — DX: Hyperlipidemia, unspecified: E78.5

## 2019-08-29 HISTORY — DX: Atherosclerotic heart disease of native coronary artery without angina pectoris: I25.10

## 2019-08-29 HISTORY — DX: Hypothyroidism, unspecified: E03.9

## 2019-08-29 HISTORY — DX: Occlusion and stenosis of unspecified carotid artery: I65.29

## 2019-08-29 LAB — SARS CORONAVIRUS 2 BY RT PCR (HOSPITAL ORDER, PERFORMED IN ~~LOC~~ HOSPITAL LAB): SARS Coronavirus 2: NEGATIVE

## 2019-08-29 LAB — CBC
HCT: 35.4 % — ABNORMAL LOW (ref 36.0–46.0)
Hemoglobin: 10.9 g/dL — ABNORMAL LOW (ref 12.0–15.0)
MCH: 29.9 pg (ref 26.0–34.0)
MCHC: 30.8 g/dL (ref 30.0–36.0)
MCV: 97 fL (ref 80.0–100.0)
Platelets: 219 10*3/uL (ref 150–400)
RBC: 3.65 MIL/uL — ABNORMAL LOW (ref 3.87–5.11)
RDW: 16.4 % — ABNORMAL HIGH (ref 11.5–15.5)
WBC: 5.1 10*3/uL (ref 4.0–10.5)
nRBC: 0 % (ref 0.0–0.2)

## 2019-08-29 LAB — BASIC METABOLIC PANEL
Anion gap: 9 (ref 5–15)
BUN: 25 mg/dL — ABNORMAL HIGH (ref 8–23)
CO2: 22 mmol/L (ref 22–32)
Calcium: 8.5 mg/dL — ABNORMAL LOW (ref 8.9–10.3)
Chloride: 104 mmol/L (ref 98–111)
Creatinine, Ser: 0.99 mg/dL (ref 0.44–1.00)
GFR calc Af Amer: 59 mL/min — ABNORMAL LOW (ref >60)
GFR calc non Af Amer: 51 mL/min — ABNORMAL LOW (ref >60)
Glucose, Bld: 262 mg/dL — ABNORMAL HIGH (ref 70–99)
Potassium: 4.9 mmol/L (ref 3.5–5.1)
Sodium: 135 mmol/L (ref 135–145)

## 2019-08-29 LAB — TROPONIN I (HIGH SENSITIVITY)
Troponin I (High Sensitivity): 21 ng/L — ABNORMAL HIGH (ref <18)
Troponin I (High Sensitivity): 26 ng/L — ABNORMAL HIGH (ref <18)

## 2019-08-29 LAB — BRAIN NATRIURETIC PEPTIDE: B Natriuretic Peptide: 1024 pg/mL — ABNORMAL HIGH (ref 0.0–100.0)

## 2019-08-29 MED ORDER — LEVOTHYROXINE SODIUM 50 MCG PO TABS
50.0000 ug | ORAL_TABLET | Freq: Every day | ORAL | Status: DC
  Administered 2019-08-30 – 2019-09-12 (×14): 50 ug via ORAL
  Filled 2019-08-29 (×14): qty 1

## 2019-08-29 MED ORDER — LOSARTAN POTASSIUM 50 MG PO TABS
100.0000 mg | ORAL_TABLET | Freq: Every day | ORAL | Status: DC
  Administered 2019-08-30 – 2019-09-02 (×4): 100 mg via ORAL
  Filled 2019-08-29 (×5): qty 2

## 2019-08-29 MED ORDER — METOPROLOL TARTRATE 50 MG PO TABS
50.0000 mg | ORAL_TABLET | Freq: Two times a day (BID) | ORAL | Status: DC
  Filled 2019-08-29: qty 2

## 2019-08-29 MED ORDER — CALCIUM CARBONATE 1250 (500 CA) MG PO TABS
1250.0000 mg | ORAL_TABLET | Freq: Every day | ORAL | Status: DC
  Administered 2019-08-30 – 2019-08-31 (×2): 1250 mg via ORAL
  Filled 2019-08-29 (×2): qty 1

## 2019-08-29 MED ORDER — FUROSEMIDE 10 MG/ML IJ SOLN
20.0000 mg | Freq: Once | INTRAMUSCULAR | Status: AC
  Administered 2019-08-29: 20 mg via INTRAVENOUS
  Filled 2019-08-29: qty 2

## 2019-08-29 MED ORDER — SIMVASTATIN 20 MG PO TABS
20.0000 mg | ORAL_TABLET | Freq: Every day | ORAL | Status: DC
  Filled 2019-08-29: qty 1

## 2019-08-29 MED ORDER — FOLIC ACID 1 MG PO TABS
500.0000 ug | ORAL_TABLET | Freq: Every day | ORAL | Status: DC
  Administered 2019-08-30 – 2019-09-01 (×3): 0.5 mg via ORAL
  Filled 2019-08-29 (×3): qty 1

## 2019-08-29 MED ORDER — GABAPENTIN 100 MG PO CAPS: 100.0000 mg | ORAL_CAPSULE | Freq: Two times a day (BID) | ORAL | Status: DC

## 2019-08-29 MED ORDER — IOHEXOL 350 MG/ML SOLN
80.0000 mL | Freq: Once | INTRAVENOUS | Status: AC | PRN
  Administered 2019-08-29: 80 mL via INTRAVENOUS

## 2019-08-29 MED ORDER — ACETAMINOPHEN 500 MG PO TABS
500.0000 mg | ORAL_TABLET | Freq: Four times a day (QID) | ORAL | Status: DC | PRN
  Administered 2019-08-31: 500 mg via ORAL
  Filled 2019-08-29: qty 1

## 2019-08-29 MED ORDER — NIFEDIPINE ER OSMOTIC RELEASE 60 MG PO TB24
60.0000 mg | ORAL_TABLET | Freq: Every day | ORAL | Status: DC
  Administered 2019-08-30 – 2019-09-04 (×6): 60 mg via ORAL
  Filled 2019-08-29 (×6): qty 1

## 2019-08-29 MED ORDER — SODIUM CHLORIDE 0.9% FLUSH
3.0000 mL | Freq: Two times a day (BID) | INTRAVENOUS | Status: DC
  Administered 2019-08-30 – 2019-08-31 (×4): 3 mL via INTRAVENOUS

## 2019-08-29 MED ORDER — VITAMIN E 180 MG (400 UNIT) PO CAPS
400.0000 [IU] | ORAL_CAPSULE | Freq: Every day | ORAL | Status: DC
  Administered 2019-08-30: 400 [IU] via ORAL
  Filled 2019-08-29: qty 1

## 2019-08-29 MED ORDER — INSULIN PUMP
Freq: Three times a day (TID) | SUBCUTANEOUS | Status: DC
  Administered 2019-08-30: 0.2 via SUBCUTANEOUS
  Administered 2019-08-30: 0.5 via SUBCUTANEOUS
  Administered 2019-08-31: 0.2 via SUBCUTANEOUS
  Administered 2019-09-02: 0.5 via SUBCUTANEOUS
  Administered 2019-09-03: 1.8 via SUBCUTANEOUS
  Administered 2019-09-03: 1.5 via SUBCUTANEOUS
  Administered 2019-09-04: 5 via SUBCUTANEOUS
  Administered 2019-09-04: 5.5 via SUBCUTANEOUS
  Filled 2019-08-29: qty 1

## 2019-08-29 MED ORDER — ENOXAPARIN SODIUM 40 MG/0.4ML ~~LOC~~ SOLN: 40.0000 mg | SUBCUTANEOUS | Status: DC

## 2019-08-29 MED ORDER — VITAMIN D 25 MCG (1000 UNIT) PO TABS
2000.0000 [IU] | ORAL_TABLET | Freq: Every day | ORAL | Status: DC
  Administered 2019-08-30 – 2019-09-01 (×3): 2000 [IU] via ORAL
  Filled 2019-08-29 (×3): qty 2

## 2019-08-29 NOTE — ED Provider Notes (Signed)
86 yo F with a chief complaints of shortness of breath and chest discomfort on exertion.  She had had this balance a month or so ago and it had resolved.  She started having symptoms again this past week.  Gotten to the point where she is only taking a few stepps and would have to stop and take a break.  She has noticed that her legs have gotten a little bit more swollen than normal.  No orthopnea or PND.  I see the patient in signout from Dr. Charm Barges, plan was for a CT angiogram of the chest and discussion with cardiology.  Her troponins are positive but just barely so.  No significant change from the first and second.  BNP is elevated at 2000.  CT angiogram of the chest returned with no pulmonary embolism the patient does have bilateral pleural effusions.  We will give a dose of Lasix.  I discussed with Dr. Anne Fu, he felt this was appropriate for medical admission.  Continue diuresis.  He felt the patient likely would improve significantly with some diuresis.   Melene Plan, DO 09/03/2019 [id number]

## 2019-08-29 NOTE — ED Notes (Signed)
At shift changed report PT was found without nasal canula Her O2 sat reads 77% on room air- Pt is placed on 2 lit [place]

## 2019-08-29 NOTE — ED Triage Notes (Signed)
Pt from home with ems for continued chest pain for the past month, pt seen here when it started but cannot get in to see her PCP until the end of June. Pt still having pain on exertion. Denies pain at this time, 324 ASA given en route. VSS, 20G LAC.

## 2019-08-29 NOTE — H&P (Signed)
History and Physical    Nancy Mcguire UYQ:[id number] DOB: May 01, 1929 DOA: 09-07-19  PCP: Angelica Chessman, MD  Patient coming from: Home   Chief Complaint: SOB, DOE  HPI: Nancy Mcguire is a 86 y.o. female with medical history significant of CKD 3, DM1, HTN, hypothyroidism, CAD, carotid stenosis, osteoporosis, chronic back pain, HLD who presents for progressive SOB, DOE and chest pain.  She notes that the SOB and DOE have worsened to the point over the past week, that she cannot move around her house without stopping to rest. She has chest pain which is left sided and axillary which is a dull ache, worse when she gets short winded and when bad, will feel like pressure.  She has a dry cough.  She denies orthopnea, PND or weight gain.  She has not had nausea, vomiting, fever, chills, change in PO intake, change in urination.  She did see her cardiology team on 08/09/19 for the chest pain and it was felt to be MSK in nature.  EDP discussed with on call cardiology, Dr. Anne Fu, who suggested a trial of diuresis given new pleural effusions on CT scan of the chest.    ED Course: In the ED, patient was found to have an elevated glucose, elevated BNP to > 1000, TnI of 21--> 26, H/H mildly low at 10/35.  She had a CTA of the chest for concern for PE.  No PE found, but was found to have small to moderate bilateral pleural effusions.  She was given a dose of IV lasix 20mg .  She is not on diuretics at home.    Review of Systems: As per HPI otherwise all other systems reviewed and are negative.   Past Medical History:  Diagnosis Date  . CAD (coronary artery disease)   . Carotid stenosis   . Chronic back pain   . CKD (chronic kidney disease) stage 3, GFR 30-59 ml/min   . Diabetes mellitus type 1 (HCC)   . Hyperlipidemia   . Hypertension   . Hypothyroidism   . Osteoporosis     Past Surgical History:  Procedure Laterality Date  . CESAREAN SECTION    . CORONARY ARTERY BYPASS GRAFT     X 3  .  TRIGGER FINGER RELEASE      Social History  reports that she has never smoked. She has never used smokeless tobacco. She reports current alcohol use. She reports that she does not use drugs.  Allergies  Allergen Reactions  . Neomycin-Bacitracin Zn-Polymyx Other (See Comments)    Eye irritation Eye irritation   . Hydrocodone-Acetaminophen Nausea And Vomiting   She does not know her father's medical history Family History  Problem Relation Age of Onset  . Healthy Mother        passed at 30, unsure of any health problems    Prior to Admission medications   Medication Sig Start Date End Date Taking? Authorizing Provider  acetaminophen (TYLENOL) 500 MG tablet Take 500 mg by mouth every 6 (six) hours as needed.    [provider]  calcium carbonate (CALCIUM 600) 600 MG TABS tablet Take 600 mg by mouth daily with breakfast.    [provider]  Cholecalciferol (VITAMIN D3 PO) Take 50 mcg by mouth daily.    [provider]  cyanocobalamin (,VITAMIN B-12,) 1000 MCG/ML injection INJECT MONTHLY 09/30/18   [provider]  denosumab (PROLIA) 60 MG/ML SOSY injection Inject 60 mg into the skin every 6 (six) months.    [provider]  folic acid (FOLVITE) 466 MCG tablet Take 400 mcg by mouth daily.    [provider]  gabapentin (NEURONTIN) 100 MG capsule Take 100 mg by mouth 2 (two) times daily.    [provider]  insulin lispro (HUMALOG) 100 UNIT/ML injection USE AS DIRECTED IN INSULIN PUMP. MDD=50 UNITS 05/19/18   [provider]  levothyroxine (SYNTHROID, LEVOTHROID) 50 MCG tablet Take 50 mcg by mouth daily before breakfast.    [provider]  losartan (COZAAR) 100 MG tablet Take 100 mg by mouth daily.    [provider]  metoprolol tartrate (LOPRESSOR) 50 MG tablet Take 50 mg by mouth 2 (two) times daily.    [provider]  NIFEdipine (PROCARDIA-XL/ADALAT CC) 60 MG 24 hr tablet Take 60 mg by  mouth daily.    [provider]  simvastatin (ZOCOR) 20 MG tablet Take 20 mg by mouth daily.    [provider]  vitamin E 400 UNIT capsule Take 400 Units by mouth daily.    [provider]    Physical Exam: Vitals:   2019-09-28 1800 September 28, 2019 1830 2019/09/28 1845 2019/09/28 1900  BP:  (!) 153/68  (!) 167/76  Pulse: 63  69   Resp: (!) 22 (!) 27 18   Temp:      TempSrc:      SpO2: 95%  90%     Constitutional: NAD, calm,  Eyes:  lids and conjunctivae normal, anicteric sclerae ENMT: MMM, NCAT Neck: normal, supple Respiratory: Increased RR, Crackles at bases, decreased breath sounds at bases, worse on the left, no rales or wheezing Cardiovascular: RR, NR, + murmur 4/6.  She has JVD to the ear.  She has 1+ pitting edema to mid shins on bilateral LE.  She has varicose veins present Abdomen: +BS, NT, ND.  Insulin pump in place Musculoskeletal: Normal muscle tone, no deformities Skin: no rashes, lesions, ulcers on exposed skin Neurologic: Grossly intact, normal speech Psychiatric: Normal judgment and insight. Alert and oriented x 3. Normal mood.    Labs on Admission: I have personally reviewed following labs and imaging studies  CBC: Recent Labs  Lab 09-28-2019 1220  WBC 5.1  HGB 10.9*  HCT 35.4*  MCV 97.0  PLT 599    Basic Metabolic Panel: Recent Labs  Lab 09/28/2019 1220  NA 135  K 4.9  CL 104  CO2 22  GLUCOSE 262*  BUN 25*  CREATININE 0.99  CALCIUM 8.5*    GFR: CrCl cannot be calculated (Unknown ideal weight.).  Liver Function Tests: No results for input(s): AST, ALT, ALKPHOS, BILITOT, PROT, ALBUMIN in the last 168 hours.  Urine analysis: No results found for: COLORURINE, APPEARANCEUR, LABSPEC, PHURINE, GLUCOSEU, HGBUR, BILIRUBINUR, KETONESUR, PROTEINUR, UROBILINOGEN, NITRITE, LEUKOCYTESUR  Radiological Exams on Admission: DG Chest 2 View  Result Date: 09-28-2019 CLINICAL DATA:  Chest pain, shortness of breath EXAM: CHEST - 2 VIEW  COMPARISON:  None. FINDINGS: Cardiomegaly status post median sternotomy. Elevation of the left hemidiaphragm. Probable small bilateral pleural effusions and/or pleural thickening. Disc degenerative disease of the thoracic spine. IMPRESSION: 1. Cardiomegaly. 2. Elevation of the left hemidiaphragm. 3. Probable small bilateral pleural effusions and/or pleural thickening. Electronically Signed   By: Eddie Candle M.D.   On: 09-28-2019 13:07   CT Angio Chest PE W/Cm &/Or Wo Cm  Result Date: 2019/09/28 CLINICAL DATA:  Shortness of breath and chest pain for past month, pain with exertion EXAM: CT ANGIOGRAPHY CHEST WITH CONTRAST TECHNIQUE: Multidetector CT imaging of  the chest was performed using the standard protocol during bolus administration of intravenous contrast. Multiplanar CT image reconstructions and MIPs were obtained to evaluate the vascular anatomy. CONTRAST:  5mL OMNIPAQUE IOHEXOL 350 MG/ML SOLN IV COMPARISON:  None FINDINGS: Cardiovascular: Atherosclerotic calcifications aorta, proximal great vessels, coronary arteries. Postsurgical changes of CABG. Enlargement of cardiac chambers. No pericardial effusion. Aorta normal caliber. Pulmonary arteries adequately opacified and patent. No evidence of pulmonary embolism. Mediastinum/Nodes: Base of cervical region normal appearance. No thoracic adenopathy. Elevation of LEFT diaphragm. Esophagus unremarkable. Lungs/Pleura: BILATERAL small to moderate pleural effusions with compressive atelectasis of the posterior lower lobes. Calcified granulomata RIGHT upper lobe. No definite infiltrate or pneumothorax. Upper Abdomen: Reflux of contrast into IVC and hepatic veins question elevated RIGHT heart pressures. Remaining visualized upper abdomen unremarkable. Musculoskeletal: No acute osseous findings. Degenerative disc disease changes midthoracic spine. Review of the MIP images confirms the above findings. IMPRESSION: No evidence of pulmonary embolism. Extensive  atherosclerotic disease including coronary arteries with postsurgical changes of CABG. Small to moderate BILATERAL pleural effusions with compressive atelectasis of lower lobes. Suspected elevated RIGHT heart pressures. Aortic Atherosclerosis (ICD10-I70.0). Electronically Signed   By: Ulyses Southward M.D.   On: 08/25/2019 17:38    EKG: Independently reviewed. Sinus bradycardia, TWI in anterior leads.  (last 2 EKGs with wandering baseline, difficult to compare.  Not present on EKG from 2019)  Assessment/Plan Volume overload Dyspnea on exertion - Unclear cause, possibly related to a new heart failure diagnosis - Last TTE from 2020 showed normal EF, but with moderate pulmonary HTN which could be the cause of her dyspnea - Trial of lasix - Repeat TTE - Cardiology consult in the morning - Telemetry - Oxygen as needed to keep O2 saturation > 92%  Coronary artery disease CABG X 3 - Currently with mild chest pain - EKG with TWI, but no definitive sign of ischemia - Troponin elevated mildly, but flat - Re-trend troponin if develops worsening chest pain - EKG in the AM - Telemetry    Hyperlipidemia - Continue statin    Essential hypertension - Continue home losartan, metoprolol and nifedipine    Diabetes type 1, controlled - May use home insulin pump, orders placed - Monitor for high and low sugars - Last A1C 7.2    Cardiac murmur - TTE as noted above  CKD 3 Normocytic anemia - Creatine appears at baseline currently (0.9 - 1.07) - Monitor - No signs/symptoms of blood loss - Could not find a baseline for H/H in care everywhere labs.      DVT prophylaxis: Lovenox  Code Status:   DNR - discussed at length with patient at bedside.  She does not feel that chest compressions and intubation fit with her life goals at this age.  Further discussion as needed  Family Communication:  Husband, Nancy Mcguire, at bedside Disposition Plan:   Patient is from:  Home  Anticipated DC  to:  Home  Anticipated DC date:  2019/09/18  Anticipated DC barriers: None  Consults called:  Will need cardiology in the AM Admission status:  INP, telemetry   Severity of Illness: The appropriate patient status for this patient is INPATIENT. Inpatient status is judged to be reasonable and necessary in order to provide the required intensity of service to ensure the patient's safety. The patient's presenting symptoms, physical exam findings, and initial radiographic and laboratory data in the context of their chronic comorbidities is felt to place them at high risk for further clinical deterioration. Furthermore, it is  not anticipated that the patient will be medically stable for discharge from the hospital within 2 midnights of admission. The following factors support the patient status of inpatient.   " The patient's presenting symptoms include SOB, DOE. " The worrisome physical exam findings include Crackles, volume overload. " The initial radiographic and laboratory data are worrisome because of pleural effusions, elevated BNP. " The chronic co-morbidities include DM1, HTN.   * I certify that at the point of admission it is my clinical judgment that the patient will require inpatient hospital care spanning beyond 2 midnights from the point of admission due to high intensity of service, high risk for further deterioration and high frequency of surveillance required.Debe Coder MD [facility]  How to contact the [facility] Attending or Consulting provider 7A - 7P or covering provider during after hours 7P -7A, for this patient?   1. Check the care team in [facility] and look for a) attending/consulting [facility] provider listed and b) the [facility] team listed 2. Log into [email or web address] and use [place]'s universal password to access. If you do not have the password, please contact the hospital operator. 3. Locate the [facility] provider you are looking for under [facility] and page to a number  that you can be directly reached. 4. If you still have difficulty reaching the provider, please page the [facility] (Director on Call) for the Hospitalists listed on amion for assistance.  08/24/2019, 7:34 PM

## 2019-08-29 NOTE — ED Notes (Signed)
Pt ambulatory, independent without assistive device

## 2019-08-29 NOTE — ED Notes (Signed)
PT's O2 on room air dropped to 80s  She is placed on 2lit [place]

## 2019-08-29 NOTE — ED Notes (Signed)
Transported to CT 

## 2019-08-29 NOTE — ED Provider Notes (Signed)
[facility] EMERGENCY DEPARTMENT Provider Note   CSN: [id number] Arrival date & time: 09-18-19  1144     History Chief Complaint  Patient presents with  . Chest Pain    Nancy Mcguire is a 86 y.o. female.  She is a history of diabetes cardiac disease cardiac murmur.  She follows with Dr. Alvester Chou from cardiology.  She has had over a month worth of shortness of breath, dyspnea on exertion, and some discomfort in her left chest.  She saw cardiology and they told her they did not think it was her heart and that she should follow-up with her primary care doctor.  She was unable to get an appointment with them and so presented here.  She has a dry cough.  No fevers.  No recent falls.  No recent medication changes.  The history is provided by the patient.  Chest Pain Pain location:  Substernal area Pain quality: throbbing   Pain radiates to:  Does not radiate Pain severity:  Moderate Onset quality:  Gradual Duration:  1 month Timing:  Intermittent Progression:  Unchanged Chronicity:  New Relieved by:  Nothing Worsened by:  Exertion Ineffective treatments:  None tried Associated symptoms: cough and shortness of breath   Associated symptoms: no abdominal pain, no fever and no headache   Risk factors: coronary artery disease, diabetes mellitus, high cholesterol and hypertension        History reviewed. No pertinent past medical history.  Patient Active Problem List   Diagnosis Date Noted  . Cardiac murmur 01/04/2019  . Carotid bruit 01/04/2019  . Hyperlipidemia 09/16/2017  . Essential hypertension 09/16/2017  . Coronary artery disease 09/16/2017  . Insulin dependent diabetes mellitus 09/16/2017    History reviewed. No pertinent surgical history.   OB History   No obstetric history on file.     No family history on file.  Social History   Tobacco Use  . Smoking status: Never Smoker  . Smokeless tobacco: Never Used  Substance Use Topics  . Alcohol  use: Yes  . Drug use: Not on file    Home Medications Prior to Admission medications   Medication Sig Start Date End Date Taking? Authorizing Provider  acetaminophen (TYLENOL) 500 MG tablet Take 500 mg by mouth every 6 (six) hours as needed.    [provider]  calcium carbonate (CALCIUM 600) 600 MG TABS tablet Take 600 mg by mouth daily with breakfast.    [provider]  Cholecalciferol (VITAMIN D3 PO) Take 50 mcg by mouth daily.    [provider]  cyanocobalamin (,VITAMIN B-12,) 1000 MCG/ML injection INJECT MONTHLY 09/30/18   [provider]  denosumab (PROLIA) 60 MG/ML SOSY injection Inject 60 mg into the skin every 6 (six) months.    [provider]  folic acid (FOLVITE) 784 MCG tablet Take 400 mcg by mouth daily.    [provider]  gabapentin (NEURONTIN) 100 MG capsule Take 100 mg by mouth 2 (two) times daily.    [provider]  insulin lispro (HUMALOG) 100 UNIT/ML injection USE AS DIRECTED IN INSULIN PUMP. MDD=50 UNITS 05/19/18   [provider]  levothyroxine (SYNTHROID, LEVOTHROID) 50 MCG tablet Take 50 mcg by mouth daily before breakfast.    [provider]  losartan (COZAAR) 100 MG tablet Take 100 mg by mouth daily.    [provider]  metoprolol tartrate (LOPRESSOR) 50 MG tablet Take 50 mg by mouth 2 (two) times daily.    [provider]  NIFEdipine (PROCARDIA-XL/ADALAT CC) 60 MG 24 hr tablet Take 60 mg by mouth daily.    [provider]  simvastatin (ZOCOR) 20 MG tablet Take 20 mg by mouth daily.    [provider]  vitamin E 400 UNIT capsule Take 400 Units by mouth daily.    [provider]    Allergies    Neomycin-bacitracin zn-polymyx and Hydrocodone-acetaminophen  Review of Systems   Review of Systems  Constitutional: Negative for fever.  HENT: Negative for sore throat.   Eyes: Negative for visual disturbance.  Respiratory: Positive for cough  and shortness of breath.   Cardiovascular: Positive for chest pain.  Gastrointestinal: Negative for abdominal pain.  Genitourinary: Negative for dysuria.  Musculoskeletal: Negative for neck pain.  Skin: Negative for rash.  Neurological: Negative for headaches.    Physical Exam Updated Vital Signs BP (!) 174/64   Pulse (!) 57   Temp 97.8 F (36.6 C) (Oral)   Resp 16   SpO2 100%   Physical Exam Vitals and nursing note reviewed.  Constitutional:      General: She is not in acute distress.    Appearance: Normal appearance. She is well-developed.  HENT:     Head: Normocephalic and atraumatic.  Eyes:     Conjunctiva/sclera: Conjunctivae normal.  Cardiovascular:     Rate and Rhythm: Normal rate and regular rhythm.     Pulses: Normal pulses.     Heart sounds: Murmur present.  Pulmonary:     Effort: Pulmonary effort is normal. No respiratory distress.     Breath sounds: Normal breath sounds.  Abdominal:     Palpations: Abdomen is soft.     Tenderness: There is no abdominal tenderness.  Musculoskeletal:        General: No deformity or signs of injury. Normal range of motion.     Cervical back: Neck supple.  Skin:    General: Skin is warm and dry.  Neurological:     General: No focal deficit present.     Mental Status: She is alert.     ED Results / Procedures / Treatments   Labs (all labs ordered are listed, but only abnormal results are displayed) Labs Reviewed  BASIC METABOLIC PANEL - Abnormal; Notable for the following components:      Result Value   Glucose, Bld 262 (*)    BUN 25 (*)    Calcium 8.5 (*)    GFR calc non Af Amer 51 (*)    GFR calc Af Amer 59 (*)    All other components within normal limits  CBC - Abnormal; Notable for the following components:   RBC 3.65 (*)    Hemoglobin 10.9 (*)    HCT 35.4 (*)    RDW 16.4 (*)    All other components within normal limits  BRAIN NATRIURETIC PEPTIDE - Abnormal; Notable for the following components:   B  Natriuretic Peptide 1,024.0 (*)    All other components within normal limits  TROPONIN I (HIGH SENSITIVITY) - Abnormal; Notable for the following components:   Troponin I (High Sensitivity) 21 (*)    All other components within normal limits  TROPONIN I (HIGH SENSITIVITY) - Abnormal; Notable for the following components:   Troponin I (High Sensitivity) 26 (*)    All other components within normal limits    EKG EKG Interpretation  Date/Time:  Monday Aug 29 2019 12:03:04 EDT Ventricular Rate:  58 PR Interval:  124 QRS Duration: 94 QT Interval:  464 QTC Calculation: 455 R Axis:   -25 Text Interpretation: Sinus bradycardia Anterior infarct , age undetermined ST & T wave abnormality, consider lateral ischemia Abnormal ECG No old tracing to compare Confirmed by Meridee Score [phone]) on 08/16/2019 3:23:42 PM   Radiology DG Chest 2 View  Result Date: 08/28/2019 CLINICAL DATA:  Chest pain, shortness of breath EXAM: CHEST - 2 VIEW COMPARISON:  None. FINDINGS: Cardiomegaly status post median sternotomy. Elevation of the left hemidiaphragm. Probable small bilateral pleural effusions and/or pleural thickening. Disc degenerative disease of the thoracic spine. IMPRESSION: 1. Cardiomegaly. 2. Elevation of the left hemidiaphragm. 3. Probable small bilateral pleural effusions and/or pleural thickening. Electronically Signed   By: Lauralyn Primes M.D.   On: 08/30/2019 13:07    Procedures Procedures (including critical care time)  Medications Ordered in ED Medications  iohexol (OMNIPAQUE) 350 MG/ML injection 80 mL (80 mLs Intravenous Contrast Given 08/31/2019 1720)    ED Course  I have reviewed the triage vital signs and the nursing notes.  Pertinent labs & imaging results that were available during my care of the patient were reviewed by me and considered in my medical decision making (see chart for details).    MDM Rules/Calculators/A&P                       This patient complains of shortness  of breath dyspnea on exertion chest discomfort; this involves an extensive number of treatment Options and is a complaint that carries with it a high risk of complications and Morbidity. The differential includes ACS, arrhythmia, pneumonia, PE, pneumothorax, vascular, anemia, CHF  I ordered, reviewed and interpreted labs, which included CBC showing stable hemoglobin, chemistries with elevated glucose, troponin elevated in the 20s will need a delta, BNP pending. I ordered imaging studies which included  and I independently    visualized and interpreted imaging which showed cardiomegaly and pleural effusion  Previous records obtained and reviewed in epic including last cardiology note by Dr. Allyson Sabal After the interventions stated above, I reevaluated the patient and found patient to be hemodynamically stable.  She is pending a CT PE and delta troponin, BNP.  Due to her multiple cardiac risk factors will likely need at least cardiology consult and potential admission.  Signed out to Dr. Adela Lank.  Final Clinical Impression(s) / ED Diagnoses Final diagnoses:  DOE (dyspnea on exertion)  Nonspecific chest pain    Rx / DC Orders ED Discharge Orders    None       Terrilee Files, MD 09/09/2019 1734

## 2019-08-30 ENCOUNTER — Inpatient Hospital Stay (HOSPITAL_COMMUNITY): Payer: Medicare Other

## 2019-08-30 DIAGNOSIS — I340 Nonrheumatic mitral (valve) insufficiency: Secondary | ICD-10-CM

## 2019-08-30 DIAGNOSIS — I350 Nonrheumatic aortic (valve) stenosis: Secondary | ICD-10-CM

## 2019-08-30 DIAGNOSIS — I272 Pulmonary hypertension, unspecified: Secondary | ICD-10-CM

## 2019-08-30 DIAGNOSIS — E1065 Type 1 diabetes mellitus with hyperglycemia: Secondary | ICD-10-CM

## 2019-08-30 DIAGNOSIS — I5033 Acute on chronic diastolic (congestive) heart failure: Secondary | ICD-10-CM

## 2019-08-30 DIAGNOSIS — I25118 Atherosclerotic heart disease of native coronary artery with other forms of angina pectoris: Secondary | ICD-10-CM

## 2019-08-30 LAB — BASIC METABOLIC PANEL
Anion gap: 7 (ref 5–15)
BUN: 16 mg/dL (ref 8–23)
CO2: 26 mmol/L (ref 22–32)
Calcium: 8.5 mg/dL — ABNORMAL LOW (ref 8.9–10.3)
Chloride: 103 mmol/L (ref 98–111)
Creatinine, Ser: 0.84 mg/dL (ref 0.44–1.00)
GFR calc Af Amer: >60 mL/min (ref >60)
GFR calc non Af Amer: >60 mL/min (ref >60)
Glucose, Bld: 215 mg/dL — ABNORMAL HIGH (ref 70–99)
Potassium: 4 mmol/L (ref 3.5–5.1)
Sodium: 136 mmol/L (ref 135–145)

## 2019-08-30 LAB — TSH: TSH: 3.765 u[IU]/mL (ref 0.350–4.500)

## 2019-08-30 LAB — ECHOCARDIOGRAM COMPLETE
Height: 62 in
Weight: 1920 oz

## 2019-08-30 LAB — CBC
HCT: 33.2 % — ABNORMAL LOW (ref 36.0–46.0)
Hemoglobin: 10.5 g/dL — ABNORMAL LOW (ref 12.0–15.0)
MCH: 29.9 pg (ref 26.0–34.0)
MCHC: 31.6 g/dL (ref 30.0–36.0)
MCV: 94.6 fL (ref 80.0–100.0)
Platelets: 200 10*3/uL (ref 150–400)
RBC: 3.51 MIL/uL — ABNORMAL LOW (ref 3.87–5.11)
RDW: 16.3 % — ABNORMAL HIGH (ref 11.5–15.5)
WBC: 4.9 10*3/uL (ref 4.0–10.5)
nRBC: 0 % (ref 0.0–0.2)

## 2019-08-30 LAB — GLUCOSE, CAPILLARY
Glucose-Capillary: 179 mg/dL — ABNORMAL HIGH (ref 70–99)
Glucose-Capillary: 202 mg/dL — ABNORMAL HIGH (ref 70–99)
Glucose-Capillary: 211 mg/dL — ABNORMAL HIGH (ref 70–99)
Glucose-Capillary: 228 mg/dL — ABNORMAL HIGH (ref 70–99)
Glucose-Capillary: 236 mg/dL — ABNORMAL HIGH (ref 70–99)

## 2019-08-30 MED ORDER — GABAPENTIN 100 MG PO CAPS
100.0000 mg | ORAL_CAPSULE | Freq: Two times a day (BID) | ORAL | Status: DC
  Administered 2019-08-30 – 2019-09-01 (×6): 100 mg via ORAL
  Filled 2019-08-30 (×6): qty 1

## 2019-08-30 MED ORDER — SIMVASTATIN 20 MG PO TABS
20.0000 mg | ORAL_TABLET | Freq: Every day | ORAL | Status: DC
  Administered 2019-08-30 – 2019-09-12 (×14): 20 mg via ORAL
  Filled 2019-08-30 (×15): qty 1

## 2019-08-30 MED ORDER — ENOXAPARIN SODIUM 40 MG/0.4ML ~~LOC~~ SOLN
40.0000 mg | SUBCUTANEOUS | Status: DC
  Administered 2019-08-30 – 2019-08-31 (×2): 40 mg via SUBCUTANEOUS
  Filled 2019-08-30 (×2): qty 0.4

## 2019-08-30 MED ORDER — METOPROLOL TARTRATE 50 MG PO TABS
50.0000 mg | ORAL_TABLET | Freq: Two times a day (BID) | ORAL | Status: DC
  Administered 2019-08-30 – 2019-09-04 (×11): 50 mg via ORAL
  Filled 2019-08-30 (×12): qty 1

## 2019-08-30 MED ORDER — FUROSEMIDE 10 MG/ML IJ SOLN
40.0000 mg | Freq: Two times a day (BID) | INTRAMUSCULAR | Status: DC
  Administered 2019-08-30 – 2019-08-31 (×2): 40 mg via INTRAVENOUS
  Filled 2019-08-30 (×2): qty 4

## 2019-08-30 MED ORDER — FUROSEMIDE 10 MG/ML IJ SOLN
40.0000 mg | Freq: Once | INTRAMUSCULAR | Status: AC
  Administered 2019-08-30: 40 mg via INTRAVENOUS
  Filled 2019-08-30: qty 4

## 2019-08-30 NOTE — Progress Notes (Signed)
  Echocardiogram 2D Echocardiogram has been performed.  Nancy Mcguire 08/30/2019, 8:51 AM

## 2019-08-30 NOTE — Progress Notes (Addendum)
Inpatient Diabetes Program Recommendations  AACE/ADA: New Consensus Statement on Inpatient Glycemic Control (2015)  Target Ranges:  Prepandial:   less than 140 mg/dL      Peak postprandial:   less than 180 mg/dL (1-2 hours)      Critically ill patients:  140 - 180 mg/dL   Lab Results  Component Value Date   GLUCAP 236 (H) 08/30/2019    Review of Glycemic Control  Results for Nancy Mcguire, Nancy Mcguire (MRN [id number]) as of 08/30/2019 15:24  Ref. Range 08/30/2019 00:22 08/30/2019 03:55 08/30/2019 11:19  Glucose-Capillary Latest Ref Range: 70 - 99 mg/dL 093 (H) 112 (H) 162 (H)    Diabetes history:  DM1  Outpatient Diabetes medications:  Insulin Pump-[employer]tr[employer]  Current orders for Inpatient glycemic control:  Insulin Pump  Note:  Spoke with patient and husband at bedside.  She is current with Dr. Katrinka Blazing with [facility] for endocrinology.  Her last A1C this week was 7.2%.  She wears a [employer].  Current pump settings,  12a-4a-0.2 4a-8a-0.225 8a-330p-0.625 330p-8p-0.925 8p-12a-0.5  Basal-12.55/day Target-110-120  She did not bolus for carbs this am.  Will continue to follow and watch trends.  Explained we would like to see CBG's <200 mg/dl and bolus for meals.  Pump site was changed today.    Thank you, Dulce Sellar, RN, BSN Diabetes Coordinator Inpatient Diabetes Program [phone] (team pager from 8a-5p)

## 2019-08-30 NOTE — Progress Notes (Signed)
PROGRESS NOTE  Joni FearsJoan Lobosco UJW:[id number] DOB: February 08, 1930   PCP: Angelica ChessmanAguiar, Rafaela M, MD  Patient is from: Home.  Independent for most part except for occasional use of cane  DOA: November 18, 2019 LOS: 1  Brief Narrative / Interim history: 86 year old female with history of diastolic CHF not on diuretics, moderate to severe pHTN, severe AS,  heart murmur, CAD, carotid artery stenosis, CKD-3a,DM-1 on insulin pump, hypothyroidism, osteoporosis, chronic back pain and HLD presenting with chest pain, shortness of breath, dyspnea on exertion and dry cough and admitted for possible acute CHF.   In the ED, hypertensive to 170s/60s.  HR in upper 50s.  RR 16-26.  100% on RA.  No significant finding on BMP or CBC.  BNP 1024. HS trop 21> 26.  EKG sinus bradycardia at 58 with nonspecific T wave changes.  CXR with cardiomegaly and small bilateral pleural effusions.  CTA chest negative for PE but a small to moderate pleural effusion.  Admitted for acute CHF and a started on IV Lasix.   Subjective: Seen and examined earlier this morning.  No major events overnight or this morning.  No complaints.  She denies chest pain and dyspnea at rest but with exertion.  Denies GI or UTI symptoms.  Objective: Vitals:   08/30/19 0013 08/30/19 0358 08/30/19 0730 08/30/19 1054  BP: (!) 167/77 (!) 155/61 (!) 174/77 (!) 172/86  Pulse: 99 73 75 77  Resp: 20 18 16 20   Temp: 97.9 F (36.6 C) 98.6 F (37 C) 98.7 F (37.1 C) 97.8 F (36.6 C)  TempSrc: Oral Oral Oral Oral  SpO2: 97% 94% 95% 96%  Weight: 54.4 kg     Height: 5\' 2"  (1.575 m)       Intake/Output Summary (Last 24 hours) at 08/30/2019 1201 Last data filed at 08/30/2019 1031 Gross per 24 hour  Intake 680 ml  Output 1900 ml  Net -1220 ml   Filed Weights   08/30/19 0013  Weight: 54.4 kg    Examination:  GENERAL: No apparent distress.  Nontoxic. HEENT: MMM.  Vision and hearing grossly intact.  NECK: Supple.  No apparent JVD.  RESP:  No IWOB.  Fair  aeration bilaterally. CVS:  RRR.  2/6 SEM over RUSB and LUSB.  ABD/GI/GU: BS+. Abd soft, NTND.  MSK/EXT:  Moves extremities. No apparent deformity. No edema.  SKIN: no apparent skin lesion or wound NEURO: Awake, alert and oriented appropriately.  No apparent focal neuro deficit. PSYCH: Calm. Normal affect.  Procedures:  None  Microbiology summarized: COVID-19 PCR negative.  Assessment & Plan: Acute on chronic diastolic CHF/mod-severe PHTN/Severe AS: cardinal symptoms of CHF exacerbation.  Echo with EF of 60 to 65%, and severe aortic valve stenosis (not new), G2-DD and mild to moderate MVR and TVR.  Not on diuretics at home.  Fair response to IV Lasix.  She received 20 mg.  UOP 700 cc/overnight.  BP still elevated.  Renal function better -Increase IV Lasix to 40 mg twice daily -Cardiology consulted -Closely monitor fluid status, renal function and electrolytes -Sodium and fluid restrictions  History of CAD: Reports exertional chest pain but believes this could be due to the above done ACS. HS trop 26> 21.  EKG with sinus bradycardia to 58 and nonspecific T wave changes. -Manage CHF as above -Continue home metoprolol, losartan and a statin. -Discontinue vitamin E  Controlled DM-1 with hypoglycemia: She is on insulin pump at home. Recent Labs    08/30/19 0022 08/30/19 0355 08/30/19 1119  GLUCAP 211* 202*  236*  -Continue insulin pump -Consult diabetic coordinator for assistance -Check hemoglobin A1c  CKD-3A: Stable. -Closely monitor while on diuretics  Essential hypertension: BP remains elevated but improved -Increased IV Lasix as above -Continue home losartan, metoprolol and nifedipine  Hypothyroidism: TSH within normal -Continue home Synthroid  Osteoporosis:Elevated troponin: Likely demand ischemia from a flutter -Outpatient follow-up  Goal of care/DNR/DNI          DVT prophylaxis: Start subcu Lovenox Code Status: DNR/DNI Family Communication: Updated patient's  husband over the phone Status is: Inpatient  Remains inpatient appropriate because:IV treatments appropriate due to intensity of illness or inability to take PO and Inpatient level of care appropriate due to severity of illness   Dispo: The patient is from: Home              Anticipated d/c is to: Home              Anticipated d/c date is: 3 days              Patient currently is not medically stable to d/c.        Consultants:  Cardiology   Sch Meds:  Scheduled Meds: . calcium carbonate  1,250 mg Oral Q breakfast  . cholecalciferol  2,000 Units Oral Daily  . folic acid  500 mcg Oral Daily  . furosemide  40 mg Intravenous Q12H  . gabapentin  100 mg Oral BID  . insulin pump   Subcutaneous TID WC, HS, 0200  . levothyroxine  50 mcg Oral QAC breakfast  . losartan  100 mg Oral Daily  . metoprolol tartrate  50 mg Oral BID  . NIFEdipine  60 mg Oral Daily  . simvastatin  20 mg Oral q1800  . sodium chloride flush  3 mL Intravenous Q12H  . vitamin E  400 Units Oral Daily   Continuous Infusions: PRN Meds:.acetaminophen  Antimicrobials: Anti-infectives (From admission, onward)   None       I have personally reviewed the following labs and images: CBC: Recent Labs  Lab 09/07/2019 1220 08/30/19 0442  WBC 5.1 4.9  HGB 10.9* 10.5*  HCT 35.4* 33.2*  MCV 97.0 94.6  PLT 219 200   BMP &GFR Recent Labs  Lab 09/10/2019 1220 08/30/19 0442  NA 135 136  K 4.9 4.0  CL 104 103  CO2 22 26  GLUCOSE 262* 215*  BUN 25* 16  CREATININE 0.99 0.84  CALCIUM 8.5* 8.5*   Estimated Creatinine Clearance: 37.3 mL/min (by C-G formula based on SCr of 0.84 mg/dL). Liver & Pancreas: No results for input(s): AST, ALT, ALKPHOS, BILITOT, PROT, ALBUMIN in the last 168 hours. No results for input(s): LIPASE, AMYLASE in the last 168 hours. No results for input(s): AMMONIA in the last 168 hours. Diabetic: No results for input(s): HGBA1C in the last 72 hours. Recent Labs  Lab 08/30/19 0022  08/30/19 0355 08/30/19 1119  GLUCAP 211* 202* 236*   Cardiac Enzymes: No results for input(s): CKTOTAL, CKMB, CKMBINDEX, TROPONINI in the last 168 hours. No results for input(s): PROBNP in the last 8760 hours. Coagulation Profile: No results for input(s): INR, PROTIME in the last 168 hours. Thyroid Function Tests: Recent Labs    08/30/19 0442  TSH 3.765   Lipid Profile: No results for input(s): CHOL, HDL, LDLCALC, TRIG, CHOLHDL, LDLDIRECT in the last 72 hours. Anemia Panel: No results for input(s): VITAMINB12, FOLATE, FERRITIN, TIBC, IRON, RETICCTPCT in the last 72 hours. Urine analysis: No results found for: COLORURINE, APPEARANCEUR, LABSPEC,  PHURINE, GLUCOSEU, HGBUR, BILIRUBINUR, KETONESUR, PROTEINUR, UROBILINOGEN, NITRITE, LEUKOCYTESUR Sepsis Labs: Invalid input(s): PROCALCITONIN, [place]  Microbiology: Recent Results (from the past 240 hour(s))  SARS Coronavirus 2 by RT PCR (hospital order, performed in [facility] hospital lab) Nasopharyngeal Nasopharyngeal Swab     Status: None   Collection Time: 08/21/2019  7:56 PM   Specimen: Nasopharyngeal Swab  Result Value Ref Range Status   SARS Coronavirus 2 NEGATIVE NEGATIVE Final    Comment: (NOTE) SARS-CoV-2 target nucleic acids are NOT DETECTED. The SARS-CoV-2 RNA is generally detectable in upper and lower respiratory specimens during the acute phase of infection. The lowest concentration of SARS-CoV-2 viral copies this assay can detect is 250 copies / mL. A negative result does not preclude SARS-CoV-2 infection and should not be used as the sole basis for treatment or other patient management decisions.  A negative result may occur with improper specimen collection / handling, submission of specimen other than nasopharyngeal swab, presence of viral mutation(s) within the areas targeted by this assay, and inadequate number of viral copies (<250 copies / mL). A negative result must be combined with clinical observations,  patient history, and epidemiological information. Fact Sheet for Patients:   StrictlyIdeas.no Fact Sheet for Healthcare Providers: BankingDealers.co.za This test is not yet approved or cleared  by the [place] FDA and has been authorized for detection and/or diagnosis of SARS-CoV-2 by FDA under an Emergency Use Authorization (EUA).  This EUA will remain in effect (meaning this test can be used) for the duration of the COVID-19 declaration under Section 564(b)(1) of the Act, 21 U.S.C. section 360bbb-3(b)(1), unless the authorization is terminated or revoked sooner. Performed at [facility], [place] [address] [place], [place] [address]     Radiology Studies: DG Chest 2 View  Result Date: 09/04/2019 CLINICAL DATA:  Chest pain, shortness of breath EXAM: CHEST - 2 VIEW COMPARISON:  None. FINDINGS: Cardiomegaly status post median sternotomy. Elevation of the left hemidiaphragm. Probable small bilateral pleural effusions and/or pleural thickening. Disc degenerative disease of the thoracic spine. IMPRESSION: 1. Cardiomegaly. 2. Elevation of the left hemidiaphragm. 3. Probable small bilateral pleural effusions and/or pleural thickening. Electronically Signed   By: Eddie Candle M.D.   On: 08/16/2019 13:07   CT Angio Chest PE W/Cm &/Or Wo Cm  Result Date: 09/12/2019 CLINICAL DATA:  Shortness of breath and chest pain for past month, pain with exertion EXAM: CT ANGIOGRAPHY CHEST WITH CONTRAST TECHNIQUE: Multidetector CT imaging of the chest was performed using the standard protocol during bolus administration of intravenous contrast. Multiplanar CT image reconstructions and MIPs were obtained to evaluate the vascular anatomy. CONTRAST:  55mL OMNIPAQUE IOHEXOL 350 MG/ML SOLN IV COMPARISON:  None FINDINGS: Cardiovascular: Atherosclerotic calcifications aorta, proximal great vessels, coronary arteries. Postsurgical changes of CABG. Enlargement of  cardiac chambers. No pericardial effusion. Aorta normal caliber. Pulmonary arteries adequately opacified and patent. No evidence of pulmonary embolism. Mediastinum/Nodes: Base of cervical region normal appearance. No thoracic adenopathy. Elevation of LEFT diaphragm. Esophagus unremarkable. Lungs/Pleura: BILATERAL small to moderate pleural effusions with compressive atelectasis of the posterior lower lobes. Calcified granulomata RIGHT upper lobe. No definite infiltrate or pneumothorax. Upper Abdomen: Reflux of contrast into IVC and hepatic veins question elevated RIGHT heart pressures. Remaining visualized upper abdomen unremarkable. Musculoskeletal: No acute osseous findings. Degenerative disc disease changes midthoracic spine. Review of the MIP images confirms the above findings. IMPRESSION: No evidence of pulmonary embolism. Extensive atherosclerotic disease including coronary arteries with postsurgical changes of CABG. Small to moderate  BILATERAL pleural effusions with compressive atelectasis of lower lobes. Suspected elevated RIGHT heart pressures. Aortic Atherosclerosis (ICD10-I70.0). Electronically Signed   By: Ulyses Southward M.D.   On: 09/17/2019 17:38   ECHOCARDIOGRAM COMPLETE  Result Date: 08/30/2019    ECHOCARDIOGRAM REPORT   Patient Name:   Nancy Mcguire Date of Exam: 08/30/2019 Medical Rec #:  [id number]     Height:       62.0 in Accession #:    [id number]    Weight:       120.0 lb Date of Birth:  07-09-1929     BSA:          1.539 m Patient Age:    86 years      BP:           174/77 mmHg Patient Gender: F             HR:           75 bpm. Exam Location:  Inpatient Procedure: 2D Echo, Cardiac Doppler and Color Doppler Indications:    Dyspnea  History:        Patient has prior history of Echocardiogram examinations, most                 recent 01/13/2019. CAD, Prior CABG, Carotid Disease, Aortic Valve                 Disease, Signs/Symptoms:Shortness of Breath and Murmur; Risk                  Factors:Hypertension, Dyslipidemia and Diabetes.  Sonographer:    Lavenia Atlas Referring Phys: [id number] EMILY B MULLEN IMPRESSIONS  1. Left ventricular ejection fraction, by estimation, is 60 to 65%. The left ventricle has normal function. The left ventricle has no regional wall motion abnormalities. There is mild left ventricular hypertrophy. Left ventricular diastolic parameters are consistent with Grade II diastolic dysfunction (pseudonormalization). Elevated left ventricular end-diastolic pressure.  2. Right ventricular systolic function is mildly reduced. The right ventricular size is normal. There is severely elevated pulmonary artery systolic pressure. The estimated right ventricular systolic pressure is 82.2 mmHg.  3. Left atrial size was mildly dilated.  4. Right atrial size was mildly dilated.  5. The mitral valve is abnormal. Mild to moderate mitral valve regurgitation.  6. Tricuspid valve regurgitation is mild to moderate.  7. The aortic valve is tricuspid. Aortic valve regurgitation is trivial. Severe aortic valve stenosis. Aortic valve area, by VTI measures 0.36 cm. Aortic valve mean gradient measures 51.0 mmHg. Aortic valve Vmax measures 4.68 m/s.  8. The inferior vena cava is dilated in size with <50% respiratory variability, suggesting right atrial pressure of 15 mmHg. Comparison(s): Changes from prior study are noted. 01/13/2019: LVEF 60-65%, severe AS - mean gradient 43 mmHg. FINDINGS  Left Ventricle: Left ventricular ejection fraction, by estimation, is 60 to 65%. The left ventricle has normal function. The left ventricle has no regional wall motion abnormalities. The left ventricular internal cavity size was normal in size. There is  mild left ventricular hypertrophy. Left ventricular diastolic parameters are consistent with Grade II diastolic dysfunction (pseudonormalization). Elevated left ventricular end-diastolic pressure. Right Ventricle: The right ventricular size is normal. No increase  in right ventricular wall thickness. Right ventricular systolic function is mildly reduced. There is severely elevated pulmonary artery systolic pressure. The tricuspid regurgitant velocity is 4.10 m/s, and with an assumed right atrial pressure of 15 mmHg, the estimated right ventricular systolic pressure is 82.2 mmHg. Left Atrium:  Left atrial size was mildly dilated. Right Atrium: Right atrial size was mildly dilated. Pericardium: There is no evidence of pericardial effusion. Mitral Valve: The mitral valve is abnormal. There is moderate thickening of the mitral valve leaflet(s). There is moderate calcification of the posterior mitral valve leaflet(s). Moderate mitral annular calcification. Mild to moderate mitral valve regurgitation. Tricuspid Valve: The tricuspid valve is grossly normal. Tricuspid valve regurgitation is mild to moderate. Aortic Valve: The aortic valve is tricuspid. Aortic valve regurgitation is trivial. Severe aortic stenosis is present. Aortic valve mean gradient measures 51.0 mmHg. Aortic valve peak gradient measures 87.6 mmHg. Aortic valve area, by VTI measures 0.36 cm. Pulmonic Valve: The pulmonic valve was normal in structure. Pulmonic valve regurgitation is not visualized. Aorta: The aortic root and ascending aorta are structurally normal, with no evidence of dilitation. Venous: The inferior vena cava is dilated in size with less than 50% respiratory variability, suggesting right atrial pressure of 15 mmHg. IAS/Shunts: No atrial level shunt detected by color flow Doppler.  LEFT VENTRICLE PLAX 2D LVIDd:         4.20 cm  Diastology LVIDs:         3.10 cm  LV e' lateral:   6.76 cm/s LV PW:         1.10 cm  LV E/e' lateral: 18.9 LV IVS:        1.10 cm  LV e' medial:    4.04 cm/s LVOT diam:     1.80 cm  LV E/e' medial:  31.7 LV SV:         41 LV SV Index:   27 LVOT Area:     2.54 cm  RIGHT VENTRICLE RV Basal diam:  2.80 cm RV S prime:     6.10 cm/s TAPSE (M-mode): 2.3 cm LEFT ATRIUM              Index       RIGHT ATRIUM           Index LA diam:        4.50 cm 2.92 cm/m  RA Area:     18.80 cm LA Vol (A2C):   52.2 ml 33.93 ml/m RA Volume:   54.70 ml  35.55 ml/m LA Vol (A4C):   45.5 ml 29.57 ml/m LA Biplane Vol: 48.4 ml 31.46 ml/m  AORTIC VALVE AV Area (Vmax):    0.39 cm AV Area (Vmean):   0.34 cm AV Area (VTI):     0.36 cm AV Vmax:           468.00 cm/s AV Vmean:          317.333 cm/s AV VTI:            1.147 m AV Peak Grad:      87.6 mmHg AV Mean Grad:      51.0 mmHg LVOT Vmax:         71.70 cm/s LVOT Vmean:        42.800 cm/s LVOT VTI:          0.163 m LVOT/AV VTI ratio: 0.14  AORTA Ao Root diam: 2.60 cm MITRAL VALVE                TRICUSPID VALVE MV Area (PHT): 5.20 cm     TR Peak grad:   67.2 mmHg MV Decel Time: 146 msec     TR Vmax:        410.00 cm/s MV E velocity: 128.00 cm/s MV A velocity: 111.00  cm/s  SHUNTS MV E/A ratio:  1.15         Systemic VTI:  0.16 m                             Systemic Diam: 1.80 cm Zoila Shutter MD Electronically signed by Zoila Shutter MD Signature Date/Time: 08/30/2019/9:49:14 AM    Final      45 minutes with more than 50% spent in reviewing records, counseling patient/family and coordinating care.   Patrece Tallie T. Gregorio Worley [facility]  If 7PM-7AM, please contact night-coverage [email or web address] Password [facility] 08/30/2019, 12:01 PM

## 2019-08-31 DIAGNOSIS — I5031 Acute diastolic (congestive) heart failure: Secondary | ICD-10-CM | POA: Diagnosis present

## 2019-08-31 DIAGNOSIS — I700 Atherosclerosis of aorta: Secondary | ICD-10-CM | POA: Diagnosis present

## 2019-08-31 DIAGNOSIS — I350 Nonrheumatic aortic (valve) stenosis: Principal | ICD-10-CM

## 2019-08-31 DIAGNOSIS — I2510 Atherosclerotic heart disease of native coronary artery without angina pectoris: Secondary | ICD-10-CM

## 2019-08-31 LAB — RENAL FUNCTION PANEL
Albumin: 3 g/dL — ABNORMAL LOW (ref 3.5–5.0)
Anion gap: 10 (ref 5–15)
BUN: 20 mg/dL (ref 8–23)
CO2: 27 mmol/L (ref 22–32)
Calcium: 8.8 mg/dL — ABNORMAL LOW (ref 8.9–10.3)
Chloride: 100 mmol/L (ref 98–111)
Creatinine, Ser: 0.97 mg/dL (ref 0.44–1.00)
GFR calc Af Amer: >60 mL/min (ref >60)
GFR calc non Af Amer: 53 mL/min — ABNORMAL LOW (ref >60)
Glucose, Bld: 100 mg/dL — ABNORMAL HIGH (ref 70–99)
Phosphorus: 3.9 mg/dL (ref 2.5–4.6)
Potassium: 3.2 mmol/L — ABNORMAL LOW (ref 3.5–5.1)
Sodium: 137 mmol/L (ref 135–145)

## 2019-08-31 LAB — CBC
HCT: 34.1 % — ABNORMAL LOW (ref 36.0–46.0)
Hemoglobin: 10.8 g/dL — ABNORMAL LOW (ref 12.0–15.0)
MCH: 29.8 pg (ref 26.0–34.0)
MCHC: 31.7 g/dL (ref 30.0–36.0)
MCV: 94.2 fL (ref 80.0–100.0)
Platelets: 222 10*3/uL (ref 150–400)
RBC: 3.62 MIL/uL — ABNORMAL LOW (ref 3.87–5.11)
RDW: 16.2 % — ABNORMAL HIGH (ref 11.5–15.5)
WBC: 5.3 10*3/uL (ref 4.0–10.5)
nRBC: 0 % (ref 0.0–0.2)

## 2019-08-31 LAB — GLUCOSE, CAPILLARY
Glucose-Capillary: 89 mg/dL (ref 70–99)
Glucose-Capillary: 99 mg/dL (ref 70–99)
Glucose-Capillary: 112 mg/dL — ABNORMAL HIGH (ref 70–99)
Glucose-Capillary: 100 mg/dL — ABNORMAL HIGH (ref 70–99)
Glucose-Capillary: 186 mg/dL — ABNORMAL HIGH (ref 70–99)

## 2019-08-31 LAB — MAGNESIUM: Magnesium: 1.5 mg/dL — ABNORMAL LOW (ref 1.7–2.4)

## 2019-08-31 MED ORDER — FUROSEMIDE 40 MG PO TABS
40.0000 mg | ORAL_TABLET | Freq: Every day | ORAL | Status: DC
  Administered 2019-09-01 – 2019-09-02 (×2): 40 mg via ORAL
  Filled 2019-08-31 (×2): qty 1

## 2019-08-31 MED ORDER — POTASSIUM CHLORIDE CRYS ER 20 MEQ PO TBCR
40.0000 meq | EXTENDED_RELEASE_TABLET | Freq: Once | ORAL | Status: AC
  Administered 2019-08-31: 40 meq via ORAL
  Filled 2019-08-31: qty 2

## 2019-08-31 NOTE — Progress Notes (Signed)
PROGRESS NOTE    Nancy Mcguire  GNF:[id number] DOB: 10-17-1929 DOA: 08/17/2019 PCP: Angelica Chessman, MD   Brief Narrative: 86 year old with past medical history significant for diastolic heart failure not on diuretics, moderate to severe pulmonary hypertension, severe aortic stenosis, heart murmur, CAD, carotid artery stenosis, CKD stage III AAA, diabetes type 2 on insulin pump, hypothyroidism, chronic back pain who presented with worsening shortness of breath and dry cough. Patient was admitted with acute on chronic diastolic heart failure exacerbation, chest x-ray showed cardiomegaly and small bilateral pleural effusion, CTA negative for PE, BNP 1024.   Assessment & Plan:   Active Problems:   Hyperlipidemia   Essential hypertension   Coronary artery disease   Diabetes type 1, controlled (HCC)   Cardiac murmur   Volume overload   Severe aortic stenosis   Acute diastolic heart failure (HCC)   Aortic atherosclerosis (HCC)  1-chronic diastolic heart failure exacerbation, severe pulmonary hypertension: Echo with ejection fraction 60 to 65%, severe aortic valve stenosis Receive IV Lasix 40 mg IV twice daily. Weight down 7 pounds. IV Lasix to oral 40 mg daily Cardiology consulted  2-aortic valve severe stenosis: Cardiology consulted Cardiology has consulted TAVR team for further evaluation.  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 Denies chest pain  Controlled diabetes type 1 with hyperglycemia: Continue insulin pump  CKD stage III a; Monitor on diuretics Essential hypertension: Continue with losartan and metoprolol Hypothyroidism continue with Synthroid Pulmonary HTN; cardiology consulted.  Hypomagnesemia: Replete Estimated body mass index is 20.69 kg/m as calculated from the following:   Height as of this encounter: 5\' 2"  (1.575 m).   Weight as of this encounter: 51.3 kg.   DVT prophylaxis: Lovenox Code Status: DNR DNI Family  Communication: Daughter and patient husband at bedside Disposition Plan:  Status is: Inpatient  AWaiting cardiology evaluation for aortic valvular stenosis, transition from IV to oral Lasix  Dispo: The patient is from: Home              Anticipated d/c is to: Home               Anticipated d/c date is: 2 or 3 days              Patient currently; not medical stable for discharge        Consultants:   Cardiology  Procedures:   Echo  Antimicrobials:  None  Subjective: , Dyspnea has improved.  Objective: Vitals:   08/30/19 1610 08/30/19 1922 08/31/19 0351 08/31/19 1136  BP: (!) 138/59 126/79 (!) 150/77 133/62  Pulse: 66 74 70 69  Resp: 16 18 18 16   Temp: 97.6 F (36.4 C) 97.8 F (36.6 C) 98.1 F (36.7 C) (!) 97.5 F (36.4 C)  TempSrc: Oral Oral Oral Oral  SpO2: 95% 93% 90% 93%  Weight:   51.3 kg   Height:        Intake/Output Summary (Last 24 hours) at 08/31/2019 1743 Last data filed at 08/31/2019 1300 Gross per 24 hour  Intake 723 ml  Output 1400 ml  Net -677 ml   Filed Weights   08/30/19 0013 08/31/19 0351  Weight: 54.4 kg 51.3 kg    Examination:  General exam: Appears calm and comfortable  Respiratory system: Clear to auscultation. Respiratory effort normal. Cardiovascular system: S1 & S2 heard, RRR. Systolic murmur Gastrointestinal system: Abdomen is nondistended, soft and nontender. No organomegaly or masses felt. Normal bowel sounds heard. Central nervous system: Alert and  oriented. No focal neurological deficits. Extremities: Symmetric 5 x 5 power. Skin: No rashes, lesions or ulcers    Data Reviewed: I have personally reviewed following labs and imaging studies  CBC: Recent Labs  Lab 08/16/2019 1220 08/30/19 0442 08/31/19 0440  WBC 5.1 4.9 5.3  HGB 10.9* 10.5* 10.8*  HCT 35.4* 33.2* 34.1*  MCV 97.0 94.6 94.2  PLT 219 200 222   Basic Metabolic Panel: Recent Labs  Lab 09/11/2019 1220 08/30/19 0442 08/31/19 0440  NA 135 136 137  K  4.9 4.0 3.2*  CL 104 103 100  CO2 22 26 27   GLUCOSE 262* 215* 100*  BUN 25* 16 20  CREATININE 0.99 0.84 0.97  CALCIUM 8.5* 8.5* 8.8*  MG  --   --  1.5*  PHOS  --   --  3.9   GFR: Estimated Creatinine Clearance: 32.3 mL/min (by C-G formula based on SCr of 0.97 mg/dL). Liver Function Tests: Recent Labs  Lab 08/31/19 0440  ALBUMIN 3.0*   No results for input(s): LIPASE, AMYLASE in the last 168 hours. No results for input(s): AMMONIA in the last 168 hours. Coagulation Profile: No results for input(s): INR, PROTIME in the last 168 hours. Cardiac Enzymes: No results for input(s): CKTOTAL, CKMB, CKMBINDEX, TROPONINI in the last 168 hours. BNP (last 3 results) No results for input(s): PROBNP in the last 8760 hours. HbA1C: No results for input(s): HGBA1C in the last 72 hours. CBG: Recent Labs  Lab 08/30/19 2107 08/31/19 0356 08/31/19 0621 08/31/19 1154 08/31/19 1556  GLUCAP 179* 89 99 112* 100*   Lipid Profile: No results for input(s): CHOL, HDL, LDLCALC, TRIG, CHOLHDL, LDLDIRECT in the last 72 hours. Thyroid Function Tests: Recent Labs    08/30/19 0442  TSH 3.765   Anemia Panel: No results for input(s): VITAMINB12, FOLATE, FERRITIN, TIBC, IRON, RETICCTPCT in the last 72 hours. Sepsis Labs: No results for input(s): PROCALCITON, LATICACIDVEN in the last 168 hours.  Recent Results (from the past 240 hour(s))  SARS Coronavirus 2 by RT PCR (hospital order, performed in [facility] hospital lab) Nasopharyngeal Nasopharyngeal Swab     Status: None   Collection Time: 08/16/2019  7:56 PM   Specimen: Nasopharyngeal Swab  Result Value Ref Range Status   SARS Coronavirus 2 NEGATIVE NEGATIVE Final    Comment: (NOTE) SARS-CoV-2 target nucleic acids are NOT DETECTED. The SARS-CoV-2 RNA is generally detectable in upper and lower respiratory specimens during the acute phase of infection. The lowest concentration of SARS-CoV-2 viral copies this assay can detect is 250 copies / mL. A  negative result does not preclude SARS-CoV-2 infection and should not be used as the sole basis for treatment or other patient management decisions.  A negative result may occur with improper specimen collection / handling, submission of specimen other than nasopharyngeal swab, presence of viral mutation(s) within the areas targeted by this assay, and inadequate number of viral copies (<250 copies / mL). A negative result must be combined with clinical observations, patient history, and epidemiological information. Fact Sheet for Patients:   08/31/19 Fact Sheet for Healthcare Providers: BoilerBrush.com.cy This test is not yet approved or cleared  by the https://pope.com/ FDA and has been authorized for detection and/or diagnosis of SARS-CoV-2 by FDA under an Emergency Use Authorization (EUA).  This EUA will remain in effect (meaning this test can be used) for the duration of the COVID-19 declaration under Section 564(b)(1) of the Act, 21 U.S.C. section 360bbb-3(b)(1), unless the authorization is terminated or revoked sooner.  Performed at [facility], [place] [address] [place], [place] [address]          Radiology Studies: ECHOCARDIOGRAM COMPLETE  Result Date: 08/30/2019    ECHOCARDIOGRAM REPORT   Patient Name:   Nancy Mcguire Date of Exam: 08/30/2019 Medical Rec #:  [id number]     Height:       62.0 in Accession #:    [id number]    Weight:       120.0 lb Date of Birth:  1930/02/05     BSA:          1.539 m Patient Age:    89 years      BP:           174/77 mmHg Patient Gender: F             HR:           75 bpm. Exam Location:  Inpatient Procedure: 2D Echo, Cardiac Doppler and Color Doppler Indications:    Dyspnea  History:        Patient has prior history of Echocardiogram examinations, most                 recent 01/13/2019. CAD, Prior CABG, Carotid Disease, Aortic Valve                 Disease, Signs/Symptoms:Shortness of Breath  and Murmur; Risk                 Factors:Hypertension, Dyslipidemia and Diabetes.  Sonographer:    Dustin Flock Referring Phys: [place]  1. Left ventricular ejection fraction, by estimation, is 60 to 65%. The left ventricle has normal function. The left ventricle has no regional wall motion abnormalities. There is mild left ventricular hypertrophy. Left ventricular diastolic parameters are consistent with Grade II diastolic dysfunction (pseudonormalization). Elevated left ventricular end-diastolic pressure.  2. Right ventricular systolic function is mildly reduced. The right ventricular size is normal. There is severely elevated pulmonary artery systolic pressure. The estimated right ventricular systolic pressure is 93.2 mmHg.  3. Left atrial size was mildly dilated.  4. Right atrial size was mildly dilated.  5. The mitral valve is abnormal. Mild to moderate mitral valve regurgitation.  6. Tricuspid valve regurgitation is mild to moderate.  7. The aortic valve is tricuspid. Aortic valve regurgitation is trivial. Severe aortic valve stenosis. Aortic valve area, by VTI measures 0.36 cm. Aortic valve mean gradient measures 51.0 mmHg. Aortic valve Vmax measures 4.68 m/s.  8. The inferior vena cava is dilated in size with <50% respiratory variability, suggesting right atrial pressure of 15 mmHg. Comparison(s): Changes from prior study are noted. 01/13/2019: LVEF 60-65%, severe AS - mean gradient 43 mmHg. FINDINGS  Left Ventricle: Left ventricular ejection fraction, by estimation, is 60 to 65%. The left ventricle has normal function. The left ventricle has no regional wall motion abnormalities. The left ventricular internal cavity size was normal in size. There is  mild left ventricular hypertrophy. Left ventricular diastolic parameters are consistent with Grade II diastolic dysfunction (pseudonormalization). Elevated left ventricular end-diastolic pressure. Right Ventricle: The right  ventricular size is normal. No increase in right ventricular wall thickness. Right ventricular systolic function is mildly reduced. There is severely elevated pulmonary artery systolic pressure. The tricuspid regurgitant velocity is 4.10 m/s, and with an assumed right atrial pressure of 15 mmHg, the estimated right ventricular systolic pressure is 35.5 mmHg. Left Atrium: Left atrial size was mildly dilated. Right Atrium: Right  atrial size was mildly dilated. Pericardium: There is no evidence of pericardial effusion. Mitral Valve: The mitral valve is abnormal. There is moderate thickening of the mitral valve leaflet(s). There is moderate calcification of the posterior mitral valve leaflet(s). Moderate mitral annular calcification. Mild to moderate mitral valve regurgitation. Tricuspid Valve: The tricuspid valve is grossly normal. Tricuspid valve regurgitation is mild to moderate. Aortic Valve: The aortic valve is tricuspid. Aortic valve regurgitation is trivial. Severe aortic stenosis is present. Aortic valve mean gradient measures 51.0 mmHg. Aortic valve peak gradient measures 87.6 mmHg. Aortic valve area, by VTI measures 0.36 cm. Pulmonic Valve: The pulmonic valve was normal in structure. Pulmonic valve regurgitation is not visualized. Aorta: The aortic root and ascending aorta are structurally normal, with no evidence of dilitation. Venous: The inferior vena cava is dilated in size with less than 50% respiratory variability, suggesting right atrial pressure of 15 mmHg. IAS/Shunts: No atrial level shunt detected by color flow Doppler.  LEFT VENTRICLE PLAX 2D LVIDd:         4.20 cm  Diastology LVIDs:         3.10 cm  LV e' lateral:   6.76 cm/s LV PW:         1.10 cm  LV E/e' lateral: 18.9 LV IVS:        1.10 cm  LV e' medial:    4.04 cm/s LVOT diam:     1.80 cm  LV E/e' medial:  31.7 LV SV:         41 LV SV Index:   27 LVOT Area:     2.54 cm  RIGHT VENTRICLE RV Basal diam:  2.80 cm RV S prime:     6.10 cm/s TAPSE  (M-mode): 2.3 cm LEFT ATRIUM             Index       RIGHT ATRIUM           Index LA diam:        4.50 cm 2.92 cm/m  RA Area:     18.80 cm LA Vol (A2C):   52.2 ml 33.93 ml/m RA Volume:   54.70 ml  35.55 ml/m LA Vol (A4C):   45.5 ml 29.57 ml/m LA Biplane Vol: 48.4 ml 31.46 ml/m  AORTIC VALVE AV Area (Vmax):    0.39 cm AV Area (Vmean):   0.34 cm AV Area (VTI):     0.36 cm AV Vmax:           468.00 cm/s AV Vmean:          317.333 cm/s AV VTI:            1.147 m AV Peak Grad:      87.6 mmHg AV Mean Grad:      51.0 mmHg LVOT Vmax:         71.70 cm/s LVOT Vmean:        42.800 cm/s LVOT VTI:          0.163 m LVOT/AV VTI ratio: 0.14  AORTA Ao Root diam: 2.60 cm MITRAL VALVE                TRICUSPID VALVE MV Area (PHT): 5.20 cm     TR Peak grad:   67.2 mmHg MV Decel Time: 146 msec     TR Vmax:        410.00 cm/s MV E velocity: 128.00 cm/s MV A velocity: 111.00 cm/s  SHUNTS MV E/A ratio:  1.15  Systemic VTI:  0.16 m                             Systemic Diam: 1.80 cm Zoila Shutter MD Electronically signed by Zoila Shutter MD Signature Date/Time: 08/30/2019/9:49:14 AM    Final         Scheduled Meds:  calcium carbonate  1,250 mg Oral Q breakfast   cholecalciferol  2,000 Units Oral Daily   enoxaparin (LOVENOX) injection  40 mg Subcutaneous Q24H   folic acid  500 mcg Oral Daily   [START ON 08/26/2019] furosemide  40 mg Oral Daily   gabapentin  100 mg Oral BID   insulin pump   Subcutaneous TID WC, HS, 0200   levothyroxine  50 mcg Oral QAC breakfast   losartan  100 mg Oral Daily   metoprolol tartrate  50 mg Oral BID   NIFEdipine  60 mg Oral Daily   simvastatin  20 mg Oral q1800   sodium chloride flush  3 mL Intravenous Q12H   Continuous Infusions:   LOS: 2 days    Time spent: 35 minutes    Ilee Randleman A Caitriona Sundquist, MD [facility]   If 7PM-7AM, please contact night-coverage [email or web address]  08/31/2019, 5:43 PM

## 2019-08-31 NOTE — Consult Note (Addendum)
[facility]   MULTIDISCIPLINARY HEART VALVE TEAM  Inpatient TAVR Consultation:   Patient ID: Nancy Mcguire; [id number]; 04/05/1930   Admit date: 08/14/2019 Date of Consult: 08/31/2019  Primary Care Provider: Angelica Chessman, MD Primary Cardiologist: Dr. Allyson Sabal  Patient Profile:   Nancy Mcguire is a 86 y.o. female with a hx of HTN, HLD, CAD s/p remote CABG x3, CKD stage IIIA, DMT1 on an insulin pump (diagnosed in her 30s), pulmonary HTN and severe aortic stenosis who is being seen today for the evaluation of severe AS at the request of Dr. Anne Fu.  History of Present Illness:   [name] lives with her husband in [place], [place]. She has three daughters, one who lives locally. She functions independently and stays very busy.  She no longer drives, but goes to the grocery store and takes care of all of her own ADLs.  She used to use her Rollator to walk around the neighborhood for exercise but has not been able to do that for several months given worsening shortness of breath and fatigue.  She has also limited by back pain.  Of note, she regularly sees the dentist and has had no current dental issues.  She underwent CABG x3 in [place] 26 years ago and has not had recurrent issues since.  She and her husband moved from [place] to [place] 6 years ago.  She was last seen by Dr. Allyson Sabal on 01/04/2019 and was completely asymptomatic at that time.  She underwent carotid Dopplers at [facility] on 09/21/2018 which were normal.   Echo 01/13/19 showed EF 60-65%, mild MR, mild-mod TR, severe AS with a mean gradient of 43 mm hg and moderate pulmonary HTN. It appears her severe AS was missed at this time. She was seen in our clinic by Edd Fabian NP on 08/09/19 for evaluation of shortness of breath and chest pressure which was felt to be most consistent with chest wall pain. She was noted to have a systolic murmur at that time. Continued monitoring was recommended.   She presented  to [facility] on 08/28/2019 with worsening shortness of breath, DOE and chest pain. In the ER, CXR showed cardiomegaly, elevation of the left hemidiaphragm, small biilateral pleural effusions and or pleural thickening. CTA of chest negative for PE. There was extensive atherosclerotic disease including coronary arteries and small to moderate bilateral pleural effusions with compressive atelectasis of lower lobes. HS Troponin 21; 26, BNP 1024, HGb 10.9, WBC 5.1 plts 219, Na 135, K+ 4.9, glucose 262, BUN 25 and Cr 0.99 and COVID neg. Repeat echo 08/30/19 showed EF 60-65%, mild LVH, G2DD, elevated LVEDP, mild RV dysfunction, severely elevated pulmonary artery pressure (82.2 mm Hg), mild biatrial enlargement, mild-mod MR, severe AS with a mean gradient of 51 mm Hg and trivial AI. She has been diuresed with IV lasix (net neg 8lbs) and breathing better.   The patient is seen today sitting up in bed.  Her daughter and husband are present.  She reports breathing much better after IV diuresis.  She reports lower extremity edema which has now resolved.  She has had significantly worsening shortness of breath to the point where very little activity prompted dyspnea.  She has not had any recent chest pain.  She does not have any dizziness or syncope.  She denies PND.  She has had worsening fatigue and has slowed down considerably over the past several months.   Past Medical History:  Diagnosis Date  . CAD (coronary artery disease)   .  Carotid stenosis   . Chronic back pain   . CKD (chronic kidney disease) stage 3, GFR 30-59 ml/min   . Diabetes mellitus type 1 (HCC)   . Hyperlipidemia   . Hypertension   . Hypothyroidism   . Osteoporosis     Past Surgical History:  Procedure Laterality Date  . CESAREAN SECTION    . CORONARY ARTERY BYPASS GRAFT     X 3  . TRIGGER FINGER RELEASE       Inpatient Medications: Scheduled Meds: . calcium carbonate  1,250 mg Oral Q breakfast  . cholecalciferol  2,000 Units Oral Daily  .  enoxaparin (LOVENOX) injection  40 mg Subcutaneous Q24H  . folic acid  500 mcg Oral Daily  . [START ON 08/27/2019] furosemide  40 mg Oral Daily  . gabapentin  100 mg Oral BID  . insulin pump   Subcutaneous TID WC, HS, 0200  . levothyroxine  50 mcg Oral QAC breakfast  . losartan  100 mg Oral Daily  . metoprolol tartrate  50 mg Oral BID  . NIFEdipine  60 mg Oral Daily  . simvastatin  20 mg Oral q1800  . sodium chloride flush  3 mL Intravenous Q12H   Continuous Infusions:  PRN Meds: acetaminophen  Allergies:    Allergies  Allergen Reactions  . Neomycin-Bacitracin Zn-Polymyx Other (See Comments)    Eyes irritation   . Hydrocodone-Acetaminophen Nausea And Vomiting    Social History:   Social History   Socioeconomic History  . Marital status: Married    Spouse name: Not on file  . Number of children: Not on file  . Years of education: Not on file  . Highest education level: Not on file  Occupational History  . Not on file  Tobacco Use  . Smoking status: Never Smoker  . Smokeless tobacco: Never Used  Substance and Sexual Activity  . Alcohol use: Yes  . Drug use: Never  . Sexual activity: Not on file  Other Topics Concern  . Not on file  Social History Narrative  . Not on file   Social Determinants of Health   Financial Resource Strain:   . Difficulty of Paying Living Expenses:   Food Insecurity:   . Worried About Programme researcher, broadcasting/film/video in the Last Year:   . Barista in the Last Year:   Transportation Needs:   . Freight forwarder (Medical):   Marland Kitchen Lack of Transportation (Non-Medical):   Physical Activity:   . Days of Exercise per Week:   . Minutes of Exercise per Session:   Stress:   . Feeling of Stress :   Social Connections:   . Frequency of Communication with Friends and Family:   . Frequency of Social Gatherings with Friends and Family:   . Attends Religious Services:   . Active Member of Clubs or Organizations:   . Attends Banker  Meetings:   Marland Kitchen Marital Status:   Intimate Partner Violence:   . Fear of Current or Ex-Partner:   . Emotionally Abused:   Marland Kitchen Physically Abused:   . Sexually Abused:     Family History:   The patient's family history includes Healthy in her mother.  ROS:  Please see the history of present illness.  ROS  All other ROS reviewed and negative.     Physical Exam/Data:   Vitals:   08/30/19 1610 08/30/19 1922 08/31/19 0351 08/31/19 1136  BP: (!) 138/59 126/79 (!) 150/77 133/62  Pulse: 66 74  70 69  Resp: 16 18 18 16   Temp: 97.6 F (36.4 C) 97.8 F (36.6 C) 98.1 F (36.7 C) (!) 97.5 F (36.4 C)  TempSrc: Oral Oral Oral Oral  SpO2: 95% 93% 90% 93%  Weight:   51.3 kg   Height:        Intake/Output Summary (Last 24 hours) at 08/31/2019 1339 Last data filed at 08/31/2019 0909 Gross per 24 hour  Intake 483 ml  Output 2000 ml  Net -1517 ml   Filed Weights   08/30/19 0013 08/31/19 0351  Weight: 54.4 kg 51.3 kg   Body mass index is 20.69 kg/m.  General:  Well nourished, well developed, in no acute distress, thin HEENT: normal Lymph: no adenopathy Neck: no JVD Endocrine:  No thryoid Cardiac:  normal S1, S2; RRR; 3 out of 6 harsh systolic ejection murmur heard best at LUSB Lungs:  clear to auscultation bilaterally, no wheezing, rhonchi or rales  Abd: soft, nontender, no hepatomegaly  Ext: no edema Musculoskeletal:  No deformities, BUE and BLE strength normal and equal Skin: warm and dry  Neuro:  CNs 2-12 intact, no focal abnormalities noted Psych:  Normal affect   EKG:  The EKG was personally reviewed and demonstrates: Sinus bradycardia heart rate 56 Telemetry:  Telemetry was personally reviewed and demonstrates: Sinus  Relevant CV Studies IMPRESSIONS  1. Left ventricular ejection fraction, by estimation, is 60 to 65%. The  left ventricle has normal function. The left ventricle has no regional  wall motion abnormalities. There is mild left ventricular hypertrophy.  Left  ventricular diastolic parameters  are consistent with Grade II diastolic dysfunction (pseudonormalization).  Elevated left ventricular end-diastolic pressure.  2. Right ventricular systolic function is mildly reduced. The right  ventricular size is normal. There is severely elevated pulmonary artery  systolic pressure. The estimated right ventricular systolic pressure is  82.2 mmHg.  3. Left atrial size was mildly dilated.  4. Right atrial size was mildly dilated.  5. The mitral valve is abnormal. Mild to moderate mitral valve  regurgitation.  6. Tricuspid valve regurgitation is mild to moderate.  7. The aortic valve is tricuspid. Aortic valve regurgitation is trivial.  Severe aortic valve stenosis. Aortic valve area, by VTI measures 0.36 cm.  Aortic valve mean gradient measures 51.0 mmHg. Aortic valve Vmax measures  4.68 m/s.  8. The inferior vena cava is dilated in size with <50% respiratory  variability, suggesting right atrial pressure of 15 mmHg.   Comparison(s): Changes from prior study are noted. 01/13/2019: LVEF 60-65%,  severe AS - mean gradient 43 mmHg.   FINDINGS  Left Ventricle: Left ventricular ejection fraction, by estimation, is 60  to 65%. The left ventricle has normal function. The left ventricle has no  regional wall motion abnormalities. The left ventricular internal cavity  size was normal in size. There is  mild left ventricular hypertrophy. Left ventricular diastolic parameters  are consistent with Grade II diastolic dysfunction (pseudonormalization).  Elevated left ventricular end-diastolic pressure.   Right Ventricle: The right ventricular size is normal. No increase in  right ventricular wall thickness. Right ventricular systolic function is  mildly reduced. There is severely elevated pulmonary artery systolic  pressure. The tricuspid regurgitant  velocity is 4.10 m/s, and with an assumed right atrial pressure of 15  mmHg, the estimated right  ventricular systolic pressure is 82.2 mmHg.   Left Atrium: Left atrial size was mildly dilated.   Right Atrium: Right atrial size was mildly dilated.   Pericardium:  There is no evidence of pericardial effusion.   Mitral Valve: The mitral valve is abnormal. There is moderate thickening  of the mitral valve leaflet(s). There is moderate calcification of the  posterior mitral valve leaflet(s). Moderate mitral annular calcification.  Mild to moderate mitral valve  regurgitation.   Tricuspid Valve: The tricuspid valve is grossly normal. Tricuspid valve  regurgitation is mild to moderate.   Aortic Valve: The aortic valve is tricuspid. Aortic valve regurgitation is  trivial. Severe aortic stenosis is present. Aortic valve mean gradient  measures 51.0 mmHg. Aortic valve peak gradient measures 87.6 mmHg. Aortic  valve area, by VTI measures 0.36  cm.   Pulmonic Valve: The pulmonic valve was normal in structure. Pulmonic valve  regurgitation is not visualized.   Aorta: The aortic root and ascending aorta are structurally normal, with  no evidence of dilitation.   Venous: The inferior vena cava is dilated in size with less than 50%  respiratory variability, suggesting right atrial pressure of 15 mmHg.   IAS/Shunts: No atrial level shunt detected by color flow Doppler.     LEFT VENTRICLE  PLAX 2D  LVIDd:     4.20 cm Diastology  LVIDs:     3.10 cm LV e' lateral:  6.76 cm/s  LV PW:     1.10 cm LV E/e' lateral: 18.9  LV IVS:    1.10 cm LV e' medial:  4.04 cm/s  LVOT diam:   1.80 cm LV E/e' medial: 31.7  LV SV:     41  LV SV Index:  27  LVOT Area:   2.54 cm     RIGHT VENTRICLE  RV Basal diam: 2.80 cm  RV S prime:   6.10 cm/s  TAPSE (M-mode): 2.3 cm   LEFT ATRIUM       Index    RIGHT ATRIUM      Index  LA diam:    4.50 cm 2.92 cm/m RA Area:   18.80 cm  LA Vol (A2C):  52.2 ml 33.93 ml/m RA Volume:  54.70 ml 35.55  ml/m  LA Vol (A4C):  45.5 ml 29.57 ml/m  LA Biplane Vol: 48.4 ml 31.46 ml/m  AORTIC VALVE  AV Area (Vmax):  0.39 cm  AV Area (Vmean):  0.34 cm  AV Area (VTI):   0.36 cm  AV Vmax:      468.00 cm/s  AV Vmean:     317.333 cm/s  AV VTI:      1.147 m  AV Peak Grad:   87.6 mmHg  AV Mean Grad:   51.0 mmHg  LVOT Vmax:     71.70 cm/s  LVOT Vmean:    42.800 cm/s  LVOT VTI:     0.163 m  LVOT/AV VTI ratio: 0.14    AORTA  Ao Root diam: 2.60 cm   MITRAL VALVE        TRICUSPID VALVE  MV Area (PHT): 5.20 cm   TR Peak grad:  67.2 mmHg  MV Decel Time: 146 msec   TR Vmax:    410.00 cm/s  MV E velocity: 128.00 cm/s  MV A velocity: 111.00 cm/s SHUNTS  MV E/A ratio: 1.15     Systemic VTI: 0.16 m  Systemic Diam: 1.80 cm    __________________  Carotid dopplers 09/2018 at [facility]   Carotid duplex scan shows a 21 to 39% stenosis of the bilateral internal carotid arteries. Vertebrals are antegrade.   Laboratory Data:  Chemistry Recent Labs  Lab 08/21/2019 1220 08/30/19  0442 08/31/19 0440  NA 135 136 137  K 4.9 4.0 3.2*  CL 104 103 100  CO2 22 26 27   GLUCOSE 262* 215* 100*  BUN 25* 16 20  CREATININE 0.99 0.84 0.97  CALCIUM 8.5* 8.5* 8.8*  GFRNONAA 51* >60 53*  GFRAA 59* >60 >60  ANIONGAP 9 7 10     Recent Labs  Lab 08/31/19 0440  ALBUMIN 3.0*   Hematology Recent Labs  Lab 09/11/2019 1220 08/30/19 0442 08/31/19 0440  WBC 5.1 4.9 5.3  RBC 3.65* 3.51* 3.62*  HGB 10.9* 10.5* 10.8*  HCT 35.4* 33.2* 34.1*  MCV 97.0 94.6 94.2  MCH 29.9 29.9 29.8  MCHC 30.8 31.6 31.7  RDW 16.4* 16.3* 16.2*  PLT 219 200 222   Cardiac EnzymesNo results for input(s): TROPONINI in the last 168 hours. No results for input(s): TROPIPOC in the last 168 hours.  BNP Recent Labs  Lab 08/27/2019 1539  BNP 1,024.0*    DDimer No results for input(s): DDIMER in the last 168 hours.  Radiology/Studies:  DG Chest 2 View  Result Date:  08/28/2019 CLINICAL DATA:  Chest pain, shortness of breath EXAM: CHEST - 2 VIEW COMPARISON:  None. FINDINGS: Cardiomegaly status post median sternotomy. Elevation of the left hemidiaphragm. Probable small bilateral pleural effusions and/or pleural thickening. Disc degenerative disease of the thoracic spine. IMPRESSION: 1. Cardiomegaly. 2. Elevation of the left hemidiaphragm. 3. Probable small bilateral pleural effusions and/or pleural thickening. Electronically Signed   By: Lauralyn PrimesAlex  Bibbey M.D.   On: 09/07/2019 13:07   CT Angio Chest PE W/Cm &/Or Wo Cm  Result Date: 08/23/2019 CLINICAL DATA:  Shortness of breath and chest pain for past month, pain with exertion EXAM: CT ANGIOGRAPHY CHEST WITH CONTRAST TECHNIQUE: Multidetector CT imaging of the chest was performed using the standard protocol during bolus administration of intravenous contrast. Multiplanar CT image reconstructions and MIPs were obtained to evaluate the vascular anatomy. CONTRAST:  80mL OMNIPAQUE IOHEXOL 350 MG/ML SOLN IV COMPARISON:  None FINDINGS: Cardiovascular: Atherosclerotic calcifications aorta, proximal great vessels, coronary arteries. Postsurgical changes of CABG. Enlargement of cardiac chambers. No pericardial effusion. Aorta normal caliber. Pulmonary arteries adequately opacified and patent. No evidence of pulmonary embolism. Mediastinum/Nodes: Base of cervical region normal appearance. No thoracic adenopathy. Elevation of LEFT diaphragm. Esophagus unremarkable. Lungs/Pleura: BILATERAL small to moderate pleural effusions with compressive atelectasis of the posterior lower lobes. Calcified granulomata RIGHT upper lobe. No definite infiltrate or pneumothorax. Upper Abdomen: Reflux of contrast into IVC and hepatic veins question elevated RIGHT heart pressures. Remaining visualized upper abdomen unremarkable. Musculoskeletal: No acute osseous findings. Degenerative disc disease changes midthoracic spine. Review of the MIP images confirms the  above findings. IMPRESSION: No evidence of pulmonary embolism. Extensive atherosclerotic disease including coronary arteries with postsurgical changes of CABG. Small to moderate BILATERAL pleural effusions with compressive atelectasis of lower lobes. Suspected elevated RIGHT heart pressures. Aortic Atherosclerosis (ICD10-I70.0). Electronically Signed   By: Ulyses SouthwardMark  Boles M.D.   On: 09/11/2019 17:38   ECHOCARDIOGRAM COMPLETE  Result Date: 08/30/2019    ECHOCARDIOGRAM REPORT   Patient Name:   Nancy FearsJOAN Chilcott Date of Exam: 08/30/2019 Medical Rec #:  [id number]     Height:       62.0 in Accession #:    [id number]    Weight:       120.0 lb Date of Birth:  1929-12-26     BSA:          1.539 m Patient Age:    89 years  BP:           174/77 mmHg Patient Gender: F             HR:           75 bpm. Exam Location:  Inpatient Procedure: 2D Echo, Cardiac Doppler and Color Doppler Indications:    Dyspnea  History:        Patient has prior history of Echocardiogram examinations, most                 recent 01/13/2019. CAD, Prior CABG, Carotid Disease, Aortic Valve                 Disease, Signs/Symptoms:Shortness of Breath and Murmur; Risk                 Factors:Hypertension, Dyslipidemia and Diabetes.  Sonographer:    Lavenia Atlas Referring Phys: [id number] EMILY B MULLEN IMPRESSIONS  1. Left ventricular ejection fraction, by estimation, is 60 to 65%. The left ventricle has normal function. The left ventricle has no regional wall motion abnormalities. There is mild left ventricular hypertrophy. Left ventricular diastolic parameters are consistent with Grade II diastolic dysfunction (pseudonormalization). Elevated left ventricular end-diastolic pressure.  2. Right ventricular systolic function is mildly reduced. The right ventricular size is normal. There is severely elevated pulmonary artery systolic pressure. The estimated right ventricular systolic pressure is 82.2 mmHg.  3. Left atrial size was mildly dilated.  4. Right atrial  size was mildly dilated.  5. The mitral valve is abnormal. Mild to moderate mitral valve regurgitation.  6. Tricuspid valve regurgitation is mild to moderate.  7. The aortic valve is tricuspid. Aortic valve regurgitation is trivial. Severe aortic valve stenosis. Aortic valve area, by VTI measures 0.36 cm. Aortic valve mean gradient measures 51.0 mmHg. Aortic valve Vmax measures 4.68 m/s.  8. The inferior vena cava is dilated in size with <50% respiratory variability, suggesting right atrial pressure of 15 mmHg. Comparison(s): Changes from prior study are noted. 01/13/2019: LVEF 60-65%, severe AS - mean gradient 43 mmHg. FINDINGS  Left Ventricle: Left ventricular ejection fraction, by estimation, is 60 to 65%. The left ventricle has normal function. The left ventricle has no regional wall motion abnormalities. The left ventricular internal cavity size was normal in size. There is  mild left ventricular hypertrophy. Left ventricular diastolic parameters are consistent with Grade II diastolic dysfunction (pseudonormalization). Elevated left ventricular end-diastolic pressure. Right Ventricle: The right ventricular size is normal. No increase in right ventricular wall thickness. Right ventricular systolic function is mildly reduced. There is severely elevated pulmonary artery systolic pressure. The tricuspid regurgitant velocity is 4.10 m/s, and with an assumed right atrial pressure of 15 mmHg, the estimated right ventricular systolic pressure is 82.2 mmHg. Left Atrium: Left atrial size was mildly dilated. Right Atrium: Right atrial size was mildly dilated. Pericardium: There is no evidence of pericardial effusion. Mitral Valve: The mitral valve is abnormal. There is moderate thickening of the mitral valve leaflet(s). There is moderate calcification of the posterior mitral valve leaflet(s). Moderate mitral annular calcification. Mild to moderate mitral valve regurgitation. Tricuspid Valve: The tricuspid valve is grossly  normal. Tricuspid valve regurgitation is mild to moderate. Aortic Valve: The aortic valve is tricuspid. Aortic valve regurgitation is trivial. Severe aortic stenosis is present. Aortic valve mean gradient measures 51.0 mmHg. Aortic valve peak gradient measures 87.6 mmHg. Aortic valve area, by VTI measures 0.36 cm. Pulmonic Valve: The pulmonic valve was normal in structure. Pulmonic valve regurgitation is  not visualized. Aorta: The aortic root and ascending aorta are structurally normal, with no evidence of dilitation. Venous: The inferior vena cava is dilated in size with less than 50% respiratory variability, suggesting right atrial pressure of 15 mmHg. IAS/Shunts: No atrial level shunt detected by color flow Doppler.  LEFT VENTRICLE PLAX 2D LVIDd:         4.20 cm  Diastology LVIDs:         3.10 cm  LV e' lateral:   6.76 cm/s LV PW:         1.10 cm  LV E/e' lateral: 18.9 LV IVS:        1.10 cm  LV e' medial:    4.04 cm/s LVOT diam:     1.80 cm  LV E/e' medial:  31.7 LV SV:         41 LV SV Index:   27 LVOT Area:     2.54 cm  RIGHT VENTRICLE RV Basal diam:  2.80 cm RV S prime:     6.10 cm/s TAPSE (M-mode): 2.3 cm LEFT ATRIUM             Index       RIGHT ATRIUM           Index LA diam:        4.50 cm 2.92 cm/m  RA Area:     18.80 cm LA Vol (A2C):   52.2 ml 33.93 ml/m RA Volume:   54.70 ml  35.55 ml/m LA Vol (A4C):   45.5 ml 29.57 ml/m LA Biplane Vol: 48.4 ml 31.46 ml/m  AORTIC VALVE AV Area (Vmax):    0.39 cm AV Area (Vmean):   0.34 cm AV Area (VTI):     0.36 cm AV Vmax:           468.00 cm/s AV Vmean:          317.333 cm/s AV VTI:            1.147 m AV Peak Grad:      87.6 mmHg AV Mean Grad:      51.0 mmHg LVOT Vmax:         71.70 cm/s LVOT Vmean:        42.800 cm/s LVOT VTI:          0.163 m LVOT/AV VTI ratio: 0.14  AORTA Ao Root diam: 2.60 cm MITRAL VALVE                TRICUSPID VALVE MV Area (PHT): 5.20 cm     TR Peak grad:   67.2 mmHg MV Decel Time: 146 msec     TR Vmax:        410.00 cm/s MV E  velocity: 128.00 cm/s MV A velocity: 111.00 cm/s  SHUNTS MV E/A ratio:  1.15         Systemic VTI:  0.16 m                             Systemic Diam: 1.80 cm Nancy Shutter MD Electronically signed by Nancy Shutter MD Signature Date/Time: 08/30/2019/9:49:14 AM    Final     [facility] Cardiomyopathy Questionnaire  KCCQ-12 08/22/2019  1 a. Ability to shower/bathe Not at all limited  1 b. Ability to walk 1 block Extremely limited  1 c. Ability to hurry/jog Extremely limited  2. Edema feet/ankles/legs Every morning  3. Limited by fatigue All of the time  4. Limited by dyspnea All of the time  5. Sitting up / on 3+ pillows Every night  6. Limited enjoyment of life Extremely limited  7. Rest of life w/ symptoms Not at all satisfied  8 a. Participation in hobbies Severely limited  8 b. Participation in chores Severely limited  8 c. Visiting family/friends Severely limited    _________________   STS Risk Calculator: Procedure: AV Replacement (isolated AVR)  Risk of Mortality: 8.906% Renal Failure: 4.665% Permanent Stroke: 5.407% Prolonged Ventilation: 16.793% DSW Infection: 0.125% Reoperation: 4.212% Morbidity or Mortality: 22.712% Short Length of Stay: 16.316% Long Length of Stay: 13.058%    Assessment and Plan:   Nancy Mcguire is a 86 y.o. female with symptoms of severe, stage D1 aortic stenosis with NYHA Class IV symptoms who is currently admitted with acute heart failure requiring IV diuresis.  I have reviewed the patient's recent echocardiogram which is notable for preserved LV systolic function and severe aortic stenosis with peak gradient of 87.6 mm Hg and mean transvalvular gradient of 51.0 mmHG. The patient's dimensionless index is 0.14 and calculated aortic valve area is 0.39 cm.    I have reviewed the natural history of aortic stenosis with the patient. We have discussed the limitations of medical therapy and the poor prognosis associated with symptomatic aortic  stenosis. We have reviewed potential treatment options, including palliative medical therapy, conventional surgical aortic valve replacement, and transcatheter aortic valve replacement. We discussed treatment options in the context of this patient's specific comorbid medical conditions.    The patient's predicted risk of mortality with conventional aortic valve replacement is 8.906% primarily based on age, CAD with remote bypass surgery, acute heart failure, type 1 diabetes on an insulin pump, hypertension. Other significant comorbid conditions include severe pulmonary hypertension. TAVR seems like a reasonable treatment option for this patient pending formal cardiac surgical consultation. We discussed typical evaluation which will require a gated cardiac CTA and a CTA of the chest/abdomen/pelvis to evaluate both his cardiac anatomy and peripheral vasculature and left and right heart catheterization, which is planned for later this afternoon.  If renal function remains stable, will plan for CTs tomorrow.  Will order a physical therapy consult.  She had a carotid study in 09/2018 at [facility] which was normal; no need to repeat.  I have reviewed the risks, indications, and alternatives to cardiac catheterization and possible angioplasty/stenting with the patient. Risks include but are not limited to bleeding, infection, vascular injury, stroke, myocardial infection, arrhythmia, kidney injury, radiation-related injury in the case of prolonged fluoroscopy use, emergency cardiac surgery, and death. The patient understands the risks of serious complication is low (<1%).   Dr. Clifton James to follow.     Signed, Cline Crock, PA-C  08/31/2019 1:39 PM   I have personally seen and examined this patient. I agree with the assessment and plan as outlined above. She has been followed for aortic stenosis by Dr. Allyson Sabal. Now admitted with acute CHF. She has diuresed well with IV Lasix and is feeling much  better. Repeat echo 08/30/19 showed EF 60-65%, mild LVH, G2DD, elevated LVEDP, mild RV dysfunction, severely elevated pulmonary artery pressure (82.2 mm Hg), mild biatrial enlargement, mild-mod MR, severe AS with a mean gradient of 51 mm Hg and trivial AI.   I have personally reviewed the echo images. She has severe, stage D aortic valve stenosis. The aortic valve is thickened, calcified with limited leaflet mobility. I think she would benefit from AVR. Given advanced age, she  is not a good candidate for conventional AVR by surgical approach. I think she may be a good candidate for TAVR.   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 The vein graft to the Diagonal is occluded. The RCA is small and non-dominant.   My exam:  General: Well developed, well nourished, NAD  HEENT: OP clear, mucus membranes moist  SKIN: warm, dry. No rashes. Neuro: No focal deficits  Musculoskeletal: Muscle strength 5/5 all ext  Psychiatric: Mood and affect normal  Neck: No JVD, no carotid bruits, no thyromegaly, no lymphadenopathy.  Lungs:Clear bilaterally, no wheezes, rhonci, crackles Cardiovascular: Regular rate and rhythm. Loud, harsh systolic murmur.  Abdomen:Soft. Bowel sounds present. Non-tender.  Extremities: No lower extremity edema. Pulses are 2 + in the bilateral DP/PT.  I have reviewed the natural history of aortic stenosis with the patient and their family members  who are present today. We have discussed the limitations of medical therapy and the poor prognosis associated with symptomatic aortic stenosis. We have reviewed potential treatment options, including palliative medical therapy, conventional surgical aortic valve replacement, and transcatheter aortic valve replacement. We discussed treatment options in the context of the patient's specific comorbid medical conditions.   She would like to proceed  with planning for TAVR. We will plan her CT scans tomorrow if renal function is stable post cath. Will arrange a PT consult. Normal carotid artery dopplers in 2020. Risks and benefits of the valve procedure reviewed with the patient. We will have one of the CT surgeons on our TAVR team see her.   Nancy Mcguire 09/18/2019 5:46 PM

## 2019-08-31 NOTE — Consult Note (Addendum)
Cardiology Consultation:   Patient ID: Nancy Mcguire MRN: [id number]; DOB: 06-17-1929  Admit date: 09-04-19 Date of Consult: 08/31/2019  Primary Care Provider: Angelica Chessman, MD Primary Cardiologist: Nanetta Batty, MD  Primary Electrophysiologist:  None    Patient Profile:   Nancy Mcguire is a 86 y.o. female with a hx of HTN, CAD (CABG X3 26 yrs ago), DM-1 (on insulin pump) and AS with echo 01/2019 with AVarea by VTI of 0.51 who is being seen today for the evaluation of SOB and severe AS at the request of Dr. Sunnie Nielsen.  History of Present Illness:   Ms. Signer with above hx presented to ER with chest pain and SOB.  Her pain began about a month ago and thought to be muscular skeletal pain. Pain mostly with exertion.  Her DOE had increased over last month she cannot walk in house without being SOB.  Along with dyspnea she had chest pressure.    CXR with cardiomegaly, elevation of the left hemidiaphragm, prob sm. Bilateral pl effusions and or pleural thickening.  CTA of chest with no PE, extensive atherosclerotic disease including coronary arteries.  Small to moderate BILATERAL pleural effusions with compressive atelectasis of lower lobes.  Troponin hs 21; 26 BNP 1024 HGb 10.9 hxt 35.4, WBC 5.1 plts 219 Na 135, K+ 4.9, CL 104, glucose 262, BUN 25 and Cr 0.99  COVID neg  EKG:  The EKG was personally reviewed and demonstrates:  SB at 58 Q wave in III, flipped T wave new in V1-2.   Telemetry:  Telemetry was personally reviewed and demonstrates:  SR to SB  Echo with EF    Mild to moderate MVR,  Mild to mod TR,  aortic valve Aortic Valve: The aortic valve is tricuspid. Aortic valve regurgitation is trivial. Severe aortic stenosis is present. Aortic valve mean gradient  measures 51.0 mmHg. Aortic valve peak gradient measures 87.6 mmHg. Aortic valve area, by VTI measures 0.36 cm.   She has rec'd IV lasix and is neg 2497 since admit and wt is down from 54.4 Kg to 51.3 Kg  (119.68  lbs to 112.86 lbs)   She denies any syncope or lightheadedness.  Her breathing feels better and she can walk in room without SOB like it was.   Past Medical History:  Diagnosis Date  . CAD (coronary artery disease)   . Carotid stenosis   . Chronic back pain   . CKD (chronic kidney disease) stage 3, GFR 30-59 ml/min   . Diabetes mellitus type 1 (HCC)   . Hyperlipidemia   . Hypertension   . Hypothyroidism   . Osteoporosis     Past Surgical History:  Procedure Laterality Date  . CESAREAN SECTION    . CORONARY ARTERY BYPASS GRAFT     X 3  . TRIGGER FINGER RELEASE       Home Medications:  Prior to Admission medications   Medication Sig Start Date End Date Taking? Authorizing Provider  acetaminophen (TYLENOL) 500 MG tablet Take 1,000 mg by mouth in the morning, at noon, and at bedtime.    Yes [provider]  calcium carbonate (CALCIUM 600) 600 MG TABS tablet Take 600 mg by mouth daily with breakfast.   Yes [provider]  Cholecalciferol (VITAMIN D3 PO) Take 50 mcg by mouth daily.   Yes [provider]  cyanocobalamin (,VITAMIN B-12,) 1000 MCG/ML injection Inject 1,000 mcg into the muscle every 30 (thirty) days.  09/30/18  Yes [provider]  denosumab (PROLIA)  60 MG/ML SOSY injection Inject 60 mg into the skin every 6 (six) months.   Yes [provider]  folic acid (FOLVITE) 867 MCG tablet Take 400 mcg by mouth daily.   Yes [provider]  gabapentin (NEURONTIN) 100 MG capsule Take 100 mg by mouth 2 (two) times daily.   Yes [provider]  insulin lispro (HUMALOG) 100 UNIT/ML injection Insulin pump 05/19/18  Yes [provider]  levothyroxine (SYNTHROID, LEVOTHROID) 50 MCG tablet Take 50 mcg by mouth daily before breakfast.   Yes [provider]  losartan (COZAAR) 100 MG tablet Take 100 mg by mouth daily.   Yes [provider]  metoprolol tartrate (LOPRESSOR) 50 MG tablet Take 50 mg by mouth 2  (two) times daily.   Yes [provider]  NIFEdipine (PROCARDIA-XL/ADALAT CC) 60 MG 24 hr tablet Take 60 mg by mouth daily.   Yes [provider]  Polyethyl Glyc-Propyl Glyc PF (SYSTANE HYDRATION PF) 0.4-0.3 % SOLN Apply 1-2 drops to eye 4 (four) times daily as needed (dry eyes).   Yes [provider]  simvastatin (ZOCOR) 20 MG tablet Take 20 mg by mouth daily.   Yes [provider]  vitamin E 400 UNIT capsule Take 400 Units by mouth daily.   Yes [provider]    Inpatient Medications: Scheduled Meds: . calcium carbonate  1,250 mg Oral Q breakfast  . cholecalciferol  2,000 Units Oral Daily  . enoxaparin (LOVENOX) injection  40 mg Subcutaneous Q24H  . folic acid  672 mcg Oral Daily  . [START ON 08/22/2019] furosemide  40 mg Oral Daily  . gabapentin  100 mg Oral BID  . insulin pump   Subcutaneous TID WC, HS, 0200  . levothyroxine  50 mcg Oral QAC breakfast  . losartan  100 mg Oral Daily  . metoprolol tartrate  50 mg Oral BID  . NIFEdipine  60 mg Oral Daily  . simvastatin  20 mg Oral q1800  . sodium chloride flush  3 mL Intravenous Q12H   Continuous Infusions:  PRN Meds: acetaminophen  Allergies:    Allergies  Allergen Reactions  . Neomycin-Bacitracin Zn-Polymyx Other (See Comments)    Eyes irritation   . Hydrocodone-Acetaminophen Nausea And Vomiting    Social History:   Social History   Socioeconomic History  . Marital status: Married    Spouse name: Not on file  . Number of children: Not on file  . Years of education: Not on file  . Highest education level: Not on file  Occupational History  . Not on file  Tobacco Use  . Smoking status: Never Smoker  . Smokeless tobacco: Never Used  Substance and Sexual Activity  . Alcohol use: Yes  . Drug use: Never  . Sexual activity: Not on file  Other Topics Concern  . Not on file  Social History Narrative  . Not on file   Social Determinants of Health   Financial Resource  Strain:   . Difficulty of Paying Living Expenses:   Food Insecurity:   . Worried About Charity fundraiser in the Last Year:   . Arboriculturist in the Last Year:   Transportation Needs:   . Film/video editor (Medical):   Marland Kitchen Lack of Transportation (Non-Medical):   Physical Activity:   . Days of Exercise per Week:   . Minutes of Exercise per Session:   Stress:   . Feeling of Stress :   Social Connections:   .  Frequency of Communication with Friends and Family:   . Frequency of Social Gatherings with Friends and Family:   . Attends Religious Services:   . Active Member of Clubs or Organizations:   . Attends Banker Meetings:   Marland Kitchen Marital Status:   Intimate Partner Violence:   . Fear of Current or Ex-Partner:   . Emotionally Abused:   Marland Kitchen Physically Abused:   . Sexually Abused:     Family History:    Family History  Problem Relation Age of Onset  . Healthy Mother        passed at 23, unsure of any health problems     ROS:  Please see the history of present illness.  General:no colds or fevers, no weight changes Skin:no rashes or ulcers HEENT:no blurred vision, no congestion CV:see HPI PUL:see HPI GI:no diarrhea constipation or melena, no indigestion GU:no hematuria, no dysuria MS:no joint pain, no claudication Neuro:no syncope, no lightheadedness Endo:+ diabetes, + thyroid disease  All other ROS reviewed and negative.     Physical Exam/Data:   Vitals:   08/30/19 1610 08/30/19 1922 08/31/19 0351 08/31/19 1136  BP: (!) 138/59 126/79 (!) 150/77 133/62  Pulse: 66 74 70 69  Resp: 16 18 18 16   Temp: 97.6 F (36.4 C) 97.8 F (36.6 C) 98.1 F (36.7 C) (!) 97.5 F (36.4 C)  TempSrc: Oral Oral Oral Oral  SpO2: 95% 93% 90% 93%  Weight:   51.3 kg   Height:        Intake/Output Summary (Last 24 hours) at 08/31/2019 1315 Last data filed at 08/31/2019 0909 Gross per 24 hour  Intake 483 ml  Output 2000 ml  Net -1517 ml   Last 3 Weights 08/31/2019  08/30/2019 08/09/2019  Weight (lbs) 113 lb 1.6 oz 120 lb 120 lb 12.8 oz  Weight (kg) 51.302 kg 54.432 kg 54.795 kg     Body mass index is 20.69 kg/m.  General:  Well nourished, well developed, in no acute distress, eating lunch HEENT: normal Lymph: no adenopathy Neck: no JVD Endocrine:  No thryomegaly Vascular: No carotid bruits; pedal pulses 1+ bilaterally  Cardiac:  normal S1, S2; RRR; 3/6 systolic murmur could hear to back,  Lungs:  clear to auscultation bilaterally, no wheezing, rhonchi or rales  Abd: soft, nontender, no hepatomegaly  Ext: no edema today resolved Musculoskeletal:  No deformities, BUE and BLE strength normal and equal Skin: warm and dry  Neuro:  Alert and oriented X 3, no focal abnormalities noted Psych:  Normal affect   Relevant CV Studies: TTE 08/30/19 IMPRESSIONS    1. Left ventricular ejection fraction, by estimation, is 60 to 65%. The  left ventricle has normal function. The left ventricle has no regional  wall motion abnormalities. There is mild left ventricular hypertrophy.  Left ventricular diastolic parameters  are consistent with Grade II diastolic dysfunction (pseudonormalization).  Elevated left ventricular end-diastolic pressure.  2. Right ventricular systolic function is mildly reduced. The right  ventricular size is normal. There is severely elevated pulmonary artery  systolic pressure. The estimated right ventricular systolic pressure is  82.2 mmHg.  3. Left atrial size was mildly dilated.  4. Right atrial size was mildly dilated.  5. The mitral valve is abnormal. Mild to moderate mitral valve  regurgitation.  6. Tricuspid valve regurgitation is mild to moderate.  7. The aortic valve is tricuspid. Aortic valve regurgitation is trivial.  Severe aortic valve stenosis. Aortic valve area, by VTI measures 0.36 cm.  Aortic valve mean gradient measures 51.0 mmHg. Aortic valve Vmax measures  4.68 m/s.  8. The inferior vena cava is dilated  in size with <50% respiratory  variability, suggesting right atrial pressure of 15 mmHg.   Comparison(s): Changes from prior study are noted. 01/13/2019: LVEF 60-65%,  severe AS - mean gradient 43 mmHg.   FINDINGS  Left Ventricle: Left ventricular ejection fraction, by estimation, is 60  to 65%. The left ventricle has normal function. The left ventricle has no  regional wall motion abnormalities. The left ventricular internal cavity  size was normal in size. There is  mild left ventricular hypertrophy. Left ventricular diastolic parameters  are consistent with Grade II diastolic dysfunction (pseudonormalization).  Elevated left ventricular end-diastolic pressure.   Right Ventricle: The right ventricular size is normal. No increase in  right ventricular wall thickness. Right ventricular systolic function is  mildly reduced. There is severely elevated pulmonary artery systolic  pressure. The tricuspid regurgitant  velocity is 4.10 m/s, and with an assumed right atrial pressure of 15  mmHg, the estimated right ventricular systolic pressure is 82.2 mmHg.   Left Atrium: Left atrial size was mildly dilated.   Right Atrium: Right atrial size was mildly dilated.   Pericardium: There is no evidence of pericardial effusion.   Mitral Valve: The mitral valve is abnormal. There is moderate thickening  of the mitral valve leaflet(s). There is moderate calcification of the  posterior mitral valve leaflet(s). Moderate mitral annular calcification.  Mild to moderate mitral valve  regurgitation.   Tricuspid Valve: The tricuspid valve is grossly normal. Tricuspid valve  regurgitation is mild to moderate.   Aortic Valve: The aortic valve is tricuspid. Aortic valve regurgitation is  trivial. Severe aortic stenosis is present. Aortic valve mean gradient  measures 51.0 mmHg. Aortic valve peak gradient measures 87.6 mmHg. Aortic  valve area, by VTI measures 0.36  cm.   Pulmonic Valve: The  pulmonic valve was normal in structure. Pulmonic valve  regurgitation is not visualized.   Aorta: The aortic root and ascending aorta are structurally normal, with  no evidence of dilitation.   Venous: The inferior vena cava is dilated in size with less than 50%  respiratory variability, suggesting right atrial pressure of 15 mmHg.   IAS/Shunts: No atrial level shunt detected by color flow Doppler.     LEFT VENTRICLE  PLAX 2D  LVIDd:     4.20 cm Diastology  LVIDs:     3.10 cm LV e' lateral:  6.76 cm/s  LV PW:     1.10 cm LV E/e' lateral: 18.9  LV IVS:    1.10 cm LV e' medial:  4.04 cm/s  LVOT diam:   1.80 cm LV E/e' medial: 31.7  LV SV:     41  LV SV Index:  27  LVOT Area:   2.54 cm     RIGHT VENTRICLE  RV Basal diam: 2.80 cm  RV S prime:   6.10 cm/s  TAPSE (M-mode): 2.3 cm   LEFT ATRIUM       Index    RIGHT ATRIUM      Index  LA diam:    4.50 cm 2.92 cm/m RA Area:   18.80 cm  LA Vol (A2C):  52.2 ml 33.93 ml/m RA Volume:  54.70 ml 35.55 ml/m  LA Vol (A4C):  45.5 ml 29.57 ml/m  LA Biplane Vol: 48.4 ml 31.46 ml/m  AORTIC VALVE  AV Area (Vmax):  0.39 cm  AV Area (Vmean):  0.34 cm  AV Area (VTI):   0.36 cm  AV Vmax:      468.00 cm/s  AV Vmean:     317.333 cm/s  AV VTI:      1.147 m  AV Peak Grad:   87.6 mmHg  AV Mean Grad:   51.0 mmHg  LVOT Vmax:     71.70 cm/s  LVOT Vmean:    42.800 cm/s  LVOT VTI:     0.163 m  LVOT/AV VTI ratio: 0.14    AORTA  Ao Root diam: 2.60 cm   MITRAL VALVE        TRICUSPID VALVE  MV Area (PHT): 5.20 cm   TR Peak grad:  67.2 mmHg  MV Decel Time: 146 msec   TR Vmax:    410.00 cm/s  MV E velocity: 128.00 cm/s  MV A velocity: 111.00 cm/s SHUNTS  MV E/A ratio: 1.15     Systemic VTI: 0.16 m               Systemic Diam: 1.80 cm   Laboratory Data:  High Sensitivity Troponin:   Recent Labs   Lab 08-Sep-2019 1220 2019-09-08 1539  TROPONINIHS 21* 26*     Chemistry Recent Labs  Lab 08-Sep-2019 1220 08/30/19 0442 08/31/19 0440  NA 135 136 137  K 4.9 4.0 3.2*  CL 104 103 100  CO2 22 26 27   GLUCOSE 262* 215* 100*  BUN 25* 16 20  CREATININE 0.99 0.84 0.97  CALCIUM 8.5* 8.5* 8.8*  GFRNONAA 51* >60 53*  GFRAA 59* >60 >60  ANIONGAP 9 7 10     Recent Labs  Lab 08/31/19 0440  ALBUMIN 3.0*   Hematology Recent Labs  Lab Sep 08, 2019 1220 08/30/19 0442 08/31/19 0440  WBC 5.1 4.9 5.3  RBC 3.65* 3.51* 3.62*  HGB 10.9* 10.5* 10.8*  HCT 35.4* 33.2* 34.1*  MCV 97.0 94.6 94.2  MCH 29.9 29.9 29.8  MCHC 30.8 31.6 31.7  RDW 16.4* 16.3* 16.2*  PLT 219 200 222   BNP Recent Labs  Lab 09/08/19 1539  BNP 1,024.0*    DDimer No results for input(s): DDIMER in the last 168 hours.   Radiology/Studies:  DG Chest 2 View  Result Date: 09/08/2019 CLINICAL DATA:  Chest pain, shortness of breath EXAM: CHEST - 2 VIEW COMPARISON:  None. FINDINGS: Cardiomegaly status post median sternotomy. Elevation of the left hemidiaphragm. Probable small bilateral pleural effusions and/or pleural thickening. Disc degenerative disease of the thoracic spine. IMPRESSION: 1. Cardiomegaly. 2. Elevation of the left hemidiaphragm. 3. Probable small bilateral pleural effusions and/or pleural thickening. Electronically Signed   By: 08/31/19 M.D.   On: Sep 08, 2019 13:07   CT Angio Chest PE W/Cm &/Or Wo Cm  Result Date: 2019/09/08 CLINICAL DATA:  Shortness of breath and chest pain for past month, pain with exertion EXAM: CT ANGIOGRAPHY CHEST WITH CONTRAST TECHNIQUE: Multidetector CT imaging of the chest was performed using the standard protocol during bolus administration of intravenous contrast. Multiplanar CT image reconstructions and MIPs were obtained to evaluate the vascular anatomy. CONTRAST:  66mL OMNIPAQUE IOHEXOL 350 MG/ML SOLN IV COMPARISON:  None FINDINGS: Cardiovascular: Atherosclerotic calcifications  aorta, proximal great vessels, coronary arteries. Postsurgical changes of CABG. Enlargement of cardiac chambers. No pericardial effusion. Aorta normal caliber. Pulmonary arteries adequately opacified and patent. No evidence of pulmonary embolism. Mediastinum/Nodes: Base of cervical region normal appearance. No thoracic adenopathy. Elevation of LEFT diaphragm. Esophagus unremarkable. Lungs/Pleura: BILATERAL small to moderate pleural effusions with compressive atelectasis of the  posterior lower lobes. Calcified granulomata RIGHT upper lobe. No definite infiltrate or pneumothorax. Upper Abdomen: Reflux of contrast into IVC and hepatic veins question elevated RIGHT heart pressures. Remaining visualized upper abdomen unremarkable. Musculoskeletal: No acute osseous findings. Degenerative disc disease changes midthoracic spine. Review of the MIP images confirms the above findings. IMPRESSION: No evidence of pulmonary embolism. Extensive atherosclerotic disease including coronary arteries with postsurgical changes of CABG. Small to moderate BILATERAL pleural effusions with compressive atelectasis of lower lobes. Suspected elevated RIGHT heart pressures. Aortic Atherosclerosis (ICD10-I70.0). Electronically Signed   By: Ulyses Southward M.D.   On: 08/28/2019 17:38   ECHOCARDIOGRAM COMPLETE  Result Date: 08/30/2019    ECHOCARDIOGRAM REPORT   Patient Name:   Nancy Mcguire Date of Exam: 08/30/2019 Medical Rec #:  [id number]     Height:       62.0 in Accession #:    [id number]    Weight:       120.0 lb Date of Birth:  November 01, 1929     BSA:          1.539 m Patient Age:    89 years      BP:           174/77 mmHg Patient Gender: F             HR:           75 bpm. Exam Location:  Inpatient Procedure: 2D Echo, Cardiac Doppler and Color Doppler Indications:    Dyspnea  History:        Patient has prior history of Echocardiogram examinations, most                 recent 01/13/2019. CAD, Prior CABG, Carotid Disease, Aortic Valve                  Disease, Signs/Symptoms:Shortness of Breath and Murmur; Risk                 Factors:Hypertension, Dyslipidemia and Diabetes.  Sonographer:    Lavenia Atlas Referring Phys: [id number] EMILY B MULLEN IMPRESSIONS  1. Left ventricular ejection fraction, by estimation, is 60 to 65%. The left ventricle has normal function. The left ventricle has no regional wall motion abnormalities. There is mild left ventricular hypertrophy. Left ventricular diastolic parameters are consistent with Grade II diastolic dysfunction (pseudonormalization). Elevated left ventricular end-diastolic pressure.  2. Right ventricular systolic function is mildly reduced. The right ventricular size is normal. There is severely elevated pulmonary artery systolic pressure. The estimated right ventricular systolic pressure is 82.2 mmHg.  3. Left atrial size was mildly dilated.  4. Right atrial size was mildly dilated.  5. The mitral valve is abnormal. Mild to moderate mitral valve regurgitation.  6. Tricuspid valve regurgitation is mild to moderate.  7. The aortic valve is tricuspid. Aortic valve regurgitation is trivial. Severe aortic valve stenosis. Aortic valve area, by VTI measures 0.36 cm. Aortic valve mean gradient measures 51.0 mmHg. Aortic valve Vmax measures 4.68 m/s.  8. The inferior vena cava is dilated in size with <50% respiratory variability, suggesting right atrial pressure of 15 mmHg. Comparison(s): Changes from prior study are noted. 01/13/2019: LVEF 60-65%, severe AS - mean gradient 43 mmHg. FINDINGS  Left Ventricle: Left ventricular ejection fraction, by estimation, is 60 to 65%. The left ventricle has normal function. The left ventricle has no regional wall motion abnormalities. The left ventricular internal cavity size was normal in size. There is  mild left ventricular hypertrophy. Left ventricular diastolic  parameters are consistent with Grade II diastolic dysfunction (pseudonormalization). Elevated left ventricular  end-diastolic pressure. Right Ventricle: The right ventricular size is normal. No increase in right ventricular wall thickness. Right ventricular systolic function is mildly reduced. There is severely elevated pulmonary artery systolic pressure. The tricuspid regurgitant velocity is 4.10 m/s, and with an assumed right atrial pressure of 15 mmHg, the estimated right ventricular systolic pressure is 82.2 mmHg. Left Atrium: Left atrial size was mildly dilated. Right Atrium: Right atrial size was mildly dilated. Pericardium: There is no evidence of pericardial effusion. Mitral Valve: The mitral valve is abnormal. There is moderate thickening of the mitral valve leaflet(s). There is moderate calcification of the posterior mitral valve leaflet(s). Moderate mitral annular calcification. Mild to moderate mitral valve regurgitation. Tricuspid Valve: The tricuspid valve is grossly normal. Tricuspid valve regurgitation is mild to moderate. Aortic Valve: The aortic valve is tricuspid. Aortic valve regurgitation is trivial. Severe aortic stenosis is present. Aortic valve mean gradient measures 51.0 mmHg. Aortic valve peak gradient measures 87.6 mmHg. Aortic valve area, by VTI measures 0.36 cm. Pulmonic Valve: The pulmonic valve was normal in structure. Pulmonic valve regurgitation is not visualized. Aorta: The aortic root and ascending aorta are structurally normal, with no evidence of dilitation. Venous: The inferior vena cava is dilated in size with less than 50% respiratory variability, suggesting right atrial pressure of 15 mmHg. IAS/Shunts: No atrial level shunt detected by color flow Doppler.  LEFT VENTRICLE PLAX 2D LVIDd:         4.20 cm  Diastology LVIDs:         3.10 cm  LV e' lateral:   6.76 cm/s LV PW:         1.10 cm  LV E/e' lateral: 18.9 LV IVS:        1.10 cm  LV e' medial:    4.04 cm/s LVOT diam:     1.80 cm  LV E/e' medial:  31.7 LV SV:         41 LV SV Index:   27 LVOT Area:     2.54 cm  RIGHT VENTRICLE RV  Basal diam:  2.80 cm RV S prime:     6.10 cm/s TAPSE (M-mode): 2.3 cm LEFT ATRIUM             Index       RIGHT ATRIUM           Index LA diam:        4.50 cm 2.92 cm/m  RA Area:     18.80 cm LA Vol (A2C):   52.2 ml 33.93 ml/m RA Volume:   54.70 ml  35.55 ml/m LA Vol (A4C):   45.5 ml 29.57 ml/m LA Biplane Vol: 48.4 ml 31.46 ml/m  AORTIC VALVE AV Area (Vmax):    0.39 cm AV Area (Vmean):   0.34 cm AV Area (VTI):     0.36 cm AV Vmax:           468.00 cm/s AV Vmean:          317.333 cm/s AV VTI:            1.147 m AV Peak Grad:      87.6 mmHg AV Mean Grad:      51.0 mmHg LVOT Vmax:         71.70 cm/s LVOT Vmean:        42.800 cm/s LVOT VTI:          0.163 m LVOT/AV VTI  ratio: 0.14  AORTA Ao Root diam: 2.60 cm MITRAL VALVE                TRICUSPID VALVE MV Area (PHT): 5.20 cm     TR Peak grad:   67.2 mmHg MV Decel Time: 146 msec     TR Vmax:        410.00 cm/s MV E velocity: 128.00 cm/s MV A velocity: 111.00 cm/s  SHUNTS MV E/A ratio:  1.15         Systemic VTI:  0.16 m                             Systemic Diam: 1.80 cm Zoila Shutter MD Electronically signed by Zoila Shutter MD Signature Date/Time: 08/30/2019/9:49:14 AM    Final        HEAR Score (for undifferentiated chest pain):     6  Assessment and Plan:   1. Angina associated with SOB, DOE, with hx of CABG 28 years ago, and severe AS.  May all be due to AS but most likely needs cardiac cath to eval and possible TAVR - Dr. Anne Fu to see. On lopressor 50 BID  2. Severe AS, noted on echo 10/20 and now today needs further work up Aortic valve area, by VTI measures 0.36 cm 3. Acute CHF with normal EF , mild LVH and G2 DD. rec'd lasix 20 mg yesterday once and then 40 mg twice now on PO but has diuresed with neg 2497 since admit and wt down 7 lbs.    4. Severely elevated RV systolic pressure at 82 mmHg  5. CAD with CABG 28 years ago. In [place]  Last nuc study 3013 and was normal.  6. HTN on cozaar 100 , procardia XL 60 mg daily, and now BB   7. HLD on zocor 20 mg daily. In Sept LDL was 54, HDL 62 continue 8. DM-2 on insulin pump. Per IM 9. hypomagnesium replace, with 2 Gm IV will defer to Dr. Anne Fu.       For questions or updates, please contact [facility] Please consult [email or web address] for contact info under     Signed, Donato Schultz, MD  08/31/2019 1:15 PM   Personally seen and examined. Agree with above.  86 year old post CABG 28 years ago with newly discovered severe aortic stenosis, symptomatic NYHA class II-III.  Currently feels more comfortable, less short of breath with diuresis.  GEN: Well nourished, well developed, in no acute distress  HEENT: normal  Neck: no JVD, carotid bruits, or masses Cardiac: RRR; 4/6 SM right upper sternal border,no rubs, or gallops,no edema  Respiratory:  clear to auscultation bilaterally, normal work of breathing GI: soft, nontender, nondistended, + BS MS: no deformity or atrophy  Skin: warm and dry, no rash Neuro:  Alert and Oriented x 3, Strength and sensation are intact Psych: euthymic mood, full affect  BNP 1000 Troponin 26 Creatinine 0.97  EKG shows sinus bradycardia 57 with poor R wave progression nonspecific ST-T wave changes. CT scan personally reviewed shows small to moderate bilateral pleural effusions  Assessment and plan:  Severe symptomatic aortic stenosis -Degenerative aortic valve disease, loud murmur heard on exam.  Symptoms of angina as well as shortness of breath.  Increased mortality over the next year without intervention.  Explained to family.  Post CABG.  I have relayed to TAVR team, structural heart team.  They will see officially tomorrow morning.  Appreciate their assistance. -  EF 65%, mean aortic valve gradient 51 mmHg, peak velocity 4.68 m/s, trileaflet  Acute diastolic heart failure -Excellent diuresis weight is down 7 pounds.  Feels better.  Lasix reviewed.  Secondary pulmonary hypertension -82 mmHg, likely from left heart  disease. -Severely enlarged right atrium, right ventricular enlargement also seen with contrast in IVC, hepatic veins.  Elevated right atrial pressures.  Seen on CT scan.  Aortic atherosclerosis noted  Mild to moderate mitral regurgitation -Should be of little clinical significance.  CAD post CABG 20 years ago in [place] -Nuclear stress test 2013 normal  Diabetes with hypertension hyperlipidemia -Medications reviewed.  LDL 54.  Hypomagnesemia -Okay to replete   Donato Schultz, MD

## 2019-09-01 ENCOUNTER — Inpatient Hospital Stay (HOSPITAL_COMMUNITY): Admission: EM | Disposition: E | Payer: Self-pay | Source: Home / Self Care | Attending: Internal Medicine

## 2019-09-01 DIAGNOSIS — I25810 Atherosclerosis of coronary artery bypass graft(s) without angina pectoris: Secondary | ICD-10-CM

## 2019-09-01 HISTORY — PX: RIGHT/LEFT HEART CATH AND CORONARY/GRAFT ANGIOGRAPHY: CATH118267

## 2019-09-01 LAB — POCT I-STAT EG7
Acid-Base Excess: 3 mmol/L — ABNORMAL HIGH (ref 0.0–2.0)
Acid-Base Excess: 3 mmol/L — ABNORMAL HIGH (ref 0.0–2.0)
Bicarbonate: 27.7 mmol/L (ref 20.0–28.0)
Bicarbonate: 28.2 mmol/L — ABNORMAL HIGH (ref 20.0–28.0)
Calcium, Ion: 1.2 mmol/L (ref 1.15–1.40)
Calcium, Ion: 1.22 mmol/L (ref 1.15–1.40)
HCT: 31 % — ABNORMAL LOW (ref 36.0–46.0)
HCT: 31 % — ABNORMAL LOW (ref 36.0–46.0)
Hemoglobin: 10.5 g/dL — ABNORMAL LOW (ref 12.0–15.0)
Hemoglobin: 10.5 g/dL — ABNORMAL LOW (ref 12.0–15.0)
O2 Saturation: 73 %
O2 Saturation: 74 %
Potassium: 3.4 mmol/L — ABNORMAL LOW (ref 3.5–5.1)
Potassium: 3.4 mmol/L — ABNORMAL LOW (ref 3.5–5.1)
Sodium: 138 mmol/L (ref 135–145)
Sodium: 138 mmol/L (ref 135–145)
TCO2: 29 mmol/L (ref 22–32)
TCO2: 30 mmol/L (ref 22–32)
pCO2, Ven: 43 mmHg — ABNORMAL LOW (ref 44.0–60.0)
pCO2, Ven: 43.1 mmHg — ABNORMAL LOW (ref 44.0–60.0)
pH, Ven: 7.417 (ref 7.250–7.430)
pH, Ven: 7.425 (ref 7.250–7.430)
pO2, Ven: 38 mmHg (ref 32.0–45.0)
pO2, Ven: 39 mmHg (ref 32.0–45.0)

## 2019-09-01 LAB — POCT I-STAT 7, (LYTES, BLD GAS, ICA,H+H)
Acid-Base Excess: 3 mmol/L — ABNORMAL HIGH (ref 0.0–2.0)
Bicarbonate: 27.1 mmol/L (ref 20.0–28.0)
Calcium, Ion: 1.16 mmol/L (ref 1.15–1.40)
HCT: 30 % — ABNORMAL LOW (ref 36.0–46.0)
Hemoglobin: 10.2 g/dL — ABNORMAL LOW (ref 12.0–15.0)
O2 Saturation: 100 %
Potassium: 3.2 mmol/L — ABNORMAL LOW (ref 3.5–5.1)
Sodium: 138 mmol/L (ref 135–145)
TCO2: 28 mmol/L (ref 22–32)
pCO2 arterial: 39 mmHg (ref 32.0–48.0)
pH, Arterial: 7.449 (ref 7.350–7.450)
pO2, Arterial: 191 mmHg — ABNORMAL HIGH (ref 83.0–108.0)

## 2019-09-01 LAB — BASIC METABOLIC PANEL
Anion gap: 13 (ref 5–15)
BUN: 23 mg/dL (ref 8–23)
CO2: 26 mmol/L (ref 22–32)
Calcium: 8.9 mg/dL (ref 8.9–10.3)
Chloride: 95 mmol/L — ABNORMAL LOW (ref 98–111)
Creatinine, Ser: 1.16 mg/dL — ABNORMAL HIGH (ref 0.44–1.00)
GFR calc Af Amer: 49 mL/min — ABNORMAL LOW (ref >60)
GFR calc non Af Amer: 42 mL/min — ABNORMAL LOW (ref >60)
Glucose, Bld: 282 mg/dL — ABNORMAL HIGH (ref 70–99)
Potassium: 3.9 mmol/L (ref 3.5–5.1)
Sodium: 134 mmol/L — ABNORMAL LOW (ref 135–145)

## 2019-09-01 LAB — CBC
HCT: 36.1 % (ref 36.0–46.0)
Hemoglobin: 11.4 g/dL — ABNORMAL LOW (ref 12.0–15.0)
MCH: 29.9 pg (ref 26.0–34.0)
MCHC: 31.6 g/dL (ref 30.0–36.0)
MCV: 94.8 fL (ref 80.0–100.0)
Platelets: 251 10*3/uL (ref 150–400)
RBC: 3.81 MIL/uL — ABNORMAL LOW (ref 3.87–5.11)
RDW: 16.2 % — ABNORMAL HIGH (ref 11.5–15.5)
WBC: 9 10*3/uL (ref 4.0–10.5)
nRBC: 0 % (ref 0.0–0.2)

## 2019-09-01 LAB — GLUCOSE, CAPILLARY
Glucose-Capillary: 231 mg/dL — ABNORMAL HIGH (ref 70–99)
Glucose-Capillary: 255 mg/dL — ABNORMAL HIGH (ref 70–99)
Glucose-Capillary: 260 mg/dL — ABNORMAL HIGH (ref 70–99)
Glucose-Capillary: 162 mg/dL — ABNORMAL HIGH (ref 70–99)

## 2019-09-01 SURGERY — RIGHT/LEFT HEART CATH AND CORONARY/GRAFT ANGIOGRAPHY
Anesthesia: LOCAL

## 2019-09-01 MED ORDER — SODIUM CHLORIDE 0.9 % WEIGHT BASED INFUSION
3.0000 mL/kg/h | INTRAVENOUS | Status: DC
  Administered 2019-09-01: 3 mL/kg/h via INTRAVENOUS

## 2019-09-01 MED ORDER — ASPIRIN 81 MG PO CHEW
81.0000 mg | CHEWABLE_TABLET | Freq: Every day | ORAL | Status: DC
  Administered 2019-09-02 – 2019-09-12 (×11): 81 mg via ORAL
  Filled 2019-09-01 (×11): qty 1

## 2019-09-01 MED ORDER — VERAPAMIL HCL 2.5 MG/ML IV SOLN
INTRAVENOUS | Status: AC
  Filled 2019-09-01: qty 2

## 2019-09-01 MED ORDER — LIDOCAINE HCL (PF) 1 % IJ SOLN
INTRAMUSCULAR | Status: AC
  Filled 2019-09-01: qty 30

## 2019-09-01 MED ORDER — ASPIRIN 81 MG PO CHEW
81.0000 mg | CHEWABLE_TABLET | ORAL | Status: AC
  Administered 2019-09-01: 81 mg via ORAL
  Filled 2019-09-01: qty 1

## 2019-09-01 MED ORDER — LABETALOL HCL 5 MG/ML IV SOLN: 10.0000 mg | INTRAVENOUS | Status: AC | PRN

## 2019-09-01 MED ORDER — SODIUM CHLORIDE 0.9% FLUSH: 3.0000 mL | Freq: Two times a day (BID) | INTRAVENOUS | Status: DC

## 2019-09-01 MED ORDER — HYDRALAZINE HCL 20 MG/ML IJ SOLN: 10.0000 mg | INTRAMUSCULAR | Status: AC | PRN

## 2019-09-01 MED ORDER — SODIUM CHLORIDE 0.9 % IV SOLN
250.0000 mL | INTRAVENOUS | Status: DC | PRN
  Administered 2019-09-08: 250 mL via INTRAVENOUS

## 2019-09-01 MED ORDER — HEPARIN (PORCINE) IN NACL 1000-0.9 UT/500ML-% IV SOLN
INTRAVENOUS | Status: AC
  Filled 2019-09-01: qty 1000

## 2019-09-01 MED ORDER — LIDOCAINE HCL (PF) 1 % IJ SOLN
INTRAMUSCULAR | Status: DC | PRN
  Administered 2019-09-01: 15 mL

## 2019-09-01 MED ORDER — HEPARIN (PORCINE) IN NACL 1000-0.9 UT/500ML-% IV SOLN
INTRAVENOUS | Status: DC | PRN
  Administered 2019-09-01 (×2): 500 mL

## 2019-09-01 MED ORDER — SODIUM CHLORIDE 0.9 % IV SOLN: INTRAVENOUS | Status: AC

## 2019-09-01 MED ORDER — SODIUM CHLORIDE 0.9% FLUSH
3.0000 mL | Freq: Two times a day (BID) | INTRAVENOUS | Status: DC
  Administered 2019-09-02 – 2019-09-12 (×21): 3 mL via INTRAVENOUS

## 2019-09-01 MED ORDER — SODIUM CHLORIDE 0.9 % IV SOLN: 250.0000 mL | INTRAVENOUS | Status: DC | PRN

## 2019-09-01 MED ORDER — SODIUM CHLORIDE 0.9% FLUSH: 3.0000 mL | INTRAVENOUS | Status: DC | PRN

## 2019-09-01 MED ORDER — ACETAMINOPHEN 325 MG PO TABS
650.0000 mg | ORAL_TABLET | ORAL | Status: DC | PRN
  Administered 2019-09-05 – 2019-09-12 (×7): 650 mg via ORAL
  Filled 2019-09-01 (×9): qty 2

## 2019-09-01 MED ORDER — SODIUM CHLORIDE 0.9 % WEIGHT BASED INFUSION: 1.0000 mL/kg/h | INTRAVENOUS | Status: DC

## 2019-09-01 MED ORDER — IOHEXOL 350 MG/ML SOLN
INTRAVENOUS | Status: DC | PRN
  Administered 2019-09-01: 140 mL

## 2019-09-01 MED ORDER — ONDANSETRON HCL 4 MG/2ML IJ SOLN
4.0000 mg | Freq: Four times a day (QID) | INTRAMUSCULAR | Status: DC | PRN
  Administered 2019-09-02 – 2019-09-13 (×2): 4 mg via INTRAVENOUS
  Filled 2019-09-01 (×2): qty 2

## 2019-09-01 SURGICAL SUPPLY — 17 items
CATH INFINITI 5FR MULTPACK ANG (CATHETERS) ×1 IMPLANT
CATH LAUNCHER 5F NOTO (CATHETERS) IMPLANT
CATH SWAN GANZ 7F STRAIGHT (CATHETERS) ×1 IMPLANT
CATHETER LAUNCHER 5F NOTO (CATHETERS) ×2
CLOSURE MYNX CONTROL 5F (Vascular Products) ×1 IMPLANT
CLOSURE MYNX CONTROL 6F/7F (Vascular Products) ×1 IMPLANT
KIT HEART LEFT (KITS) ×2 IMPLANT
PACK CARDIAC CATHETERIZATION (CUSTOM PROCEDURE TRAY) ×2 IMPLANT
SHEATH PINNACLE 5F 10CM (SHEATH) ×1 IMPLANT
SHEATH PINNACLE 7F 10CM (SHEATH) ×1 IMPLANT
SYR MEDRAD MARK 7 150ML (SYRINGE) ×2 IMPLANT
TRANSDUCER W/STOPCOCK (MISCELLANEOUS) ×2 IMPLANT
TUBING CIL FLEX 10 FLL-RA (TUBING) ×2 IMPLANT
WIRE EMERALD 3MM-J .025X260CM (WIRE) ×1 IMPLANT
WIRE EMERALD 3MM-J .035X150CM (WIRE) ×1 IMPLANT
WIRE EMERALD ST .035X150CM (WIRE) ×1 IMPLANT
WIRE HI TORQ VERSACORE-J 145CM (WIRE) ×1 IMPLANT

## 2019-09-01 NOTE — Progress Notes (Signed)
PROGRESS NOTE    Nancy Mcguire  QMG:[id number] DOB: 1929/06/01 DOA: 09/05/2019 PCP: Robyne Peers, MD   Brief Narrative: 86 year old with past medical history significant for diastolic heart failure not on diuretics, moderate to severe pulmonary hypertension, severe aortic stenosis, heart murmur, CAD, carotid artery stenosis, CKD stage III AAA, diabetes type 2 on insulin pump, hypothyroidism, chronic back pain who presented with worsening shortness of breath and dry cough. Patient was admitted with acute on chronic diastolic heart failure exacerbation, chest x-ray showed cardiomegaly and small bilateral pleural effusion, CTA negative for PE, BNP 1024.   Assessment & Plan:   Active Problems:   Hyperlipidemia   Essential hypertension   Coronary artery disease   Diabetes type 1, controlled (HCC)   Cardiac murmur   Volume overload   Severe aortic stenosis   Acute diastolic heart failure (HCC)   Aortic atherosclerosis (HCC)  1-Chronic diastolic heart failure exacerbation, severe pulmonary hypertension: Echo with ejection fraction 60 to 65%, severe aortic valve stenosis Receive IV Lasix 40 mg IV twice daily. Weight 120---112. Lasix to oral 40 mg daily Cardiology consulted  2-Aortic Valve Severe Stenosis: Cardiology consulted Cardiology has consulted TAVR team for further evaluation. For Cath today , evaluation for TAVAR.   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 Denies chest pain  Controlled diabetes type 1 with hyperglycemia: Continue insulin pump CBG increasing. Appreciate Diabetes educator follow up.    CKD stage III a; Monitor on diuretics.  Essential hypertension: Continue with losartan and metoprolol.  Hypothyroidism continue with Synthroid.  Pulmonary HTN; cardiology consulted.   Hypomagnesemia: Replete.  Estimated body mass index is 20.59 kg/m as calculated from the following:   Height as of this encounter: 5\' 2"  (1.575 m).   Weight as of this encounter: 51.1 kg.   DVT prophylaxis: Lovenox Code Status: DNR DNI Family Communication: Daughter and patient husband at bedside Disposition Plan:  Status is: Inpatient  AWaiting cardiology evaluation for aortic valvular stenosis, transition from IV to oral Lasix  Dispo: The patient is from: Home              Anticipated d/c is to: Home               Anticipated d/c date is: 2 or 3 days              Patient currently; not medical stable for discharge        Consultants:   Cardiology  Procedures:   Echo  Antimicrobials:  None  Subjective: Denies dyspnea.  Awaiting heart cath  Objective: Vitals:   08/31/19 0351 08/31/19 1136 08/31/19 1950 08/26/2019 0559  BP: (!) 150/77 133/62 122/62 (!) 146/67  Pulse: 70 69 81 80  Resp: 18 16 (!) 21 20  Temp: 98.1 F (36.7 C) (!) 97.5 F (36.4 C) 99.8 F (37.7 C) 98 F (36.7 C)  TempSrc: Oral Oral Oral Oral  SpO2: 90% 93% 95% 100%  Weight: 51.3 kg   51.1 kg  Height:        Intake/Output Summary (Last 24 hours) at 08/22/2019 1527 Last data filed at 09/03/2019 1500 Gross per 24 hour  Intake 838.68 ml  Output --  Net 838.68 ml   Filed Weights   08/30/19 0013 08/31/19 0351 08/31/2019 0559  Weight: 54.4 kg 51.3 kg 51.1 kg    Examination:  General exam: NAD Respiratory system: CTA Cardiovascular system: S , s  2  Systolic murmur Gastrointestinal system: BS present, soft,  nt Central nervous system: alert, non focal.  Extremities: no edema   Data Reviewed: I have personally reviewed following labs and imaging studies  CBC: Recent Labs  Lab 08/19/2019 1220 08/30/19 0442 08/31/19 0440 09/06/2019 0558  WBC 5.1 4.9 5.3 9.0  HGB 10.9* 10.5* 10.8* 11.4*  HCT 35.4* 33.2* 34.1* 36.1  MCV 97.0 94.6 94.2 94.8  PLT 219 200 222 251   Basic Metabolic Panel: Recent Labs  Lab 09/08/2019 1220 08/30/19 0442 08/31/19 0440 08/18/2019 0558  NA 135 136 137 134*  K 4.9 4.0 3.2* 3.9  CL 104 103 100 95*  CO2  22 26 27 26   GLUCOSE 262* 215* 100* 282*  BUN 25* 16 20 23   CREATININE 0.99 0.84 0.97 1.16*  CALCIUM 8.5* 8.5* 8.8* 8.9  MG  --   --  1.5*  --   PHOS  --   --  3.9  --    GFR: Estimated Creatinine Clearance: 27 mL/min (A) (by C-G formula based on SCr of 1.16 mg/dL (H)). Liver Function Tests: Recent Labs  Lab 08/31/19 0440  ALBUMIN 3.0*   No results for input(s): LIPASE, AMYLASE in the last 168 hours. No results for input(s): AMMONIA in the last 168 hours. Coagulation Profile: No results for input(s): INR, PROTIME in the last 168 hours. Cardiac Enzymes: No results for input(s): CKTOTAL, CKMB, CKMBINDEX, TROPONINI in the last 168 hours. BNP (last 3 results) No results for input(s): PROBNP in the last 8760 hours. HbA1C: No results for input(s): HGBA1C in the last 72 hours. CBG: Recent Labs  Lab 08/31/19 1556 08/31/19 2144 09/07/2019 0345 09/11/2019 0602 08/15/2019 1111  GLUCAP 100* 186* 231* 255* 260*   Lipid Profile: No results for input(s): CHOL, HDL, LDLCALC, TRIG, CHOLHDL, LDLDIRECT in the last 72 hours. Thyroid Function Tests: Recent Labs    08/30/19 0442  TSH 3.765   Anemia Panel: No results for input(s): VITAMINB12, FOLATE, FERRITIN, TIBC, IRON, RETICCTPCT in the last 72 hours. Sepsis Labs: No results for input(s): PROCALCITON, LATICACIDVEN in the last 168 hours.  Recent Results (from the past 240 hour(s))  SARS Coronavirus 2 by RT PCR (hospital order, performed in [facility] hospital lab) Nasopharyngeal Nasopharyngeal Swab     Status: None   Collection Time: 08/17/2019  7:56 PM   Specimen: Nasopharyngeal Swab  Result Value Ref Range Status   SARS Coronavirus 2 NEGATIVE NEGATIVE Final    Comment: (NOTE) SARS-CoV-2 target nucleic acids are NOT DETECTED. The SARS-CoV-2 RNA is generally detectable in upper and lower respiratory specimens during the acute phase of infection. The lowest concentration of SARS-CoV-2 viral copies this assay can detect is 250 copies /  mL. A negative result does not preclude SARS-CoV-2 infection and should not be used as the sole basis for treatment or other patient management decisions.  A negative result may occur with improper specimen collection / handling, submission of specimen other than nasopharyngeal swab, presence of viral mutation(s) within the areas targeted by this assay, and inadequate number of viral copies (<250 copies / mL). A negative result must be combined with clinical observations, patient history, and epidemiological information. Fact Sheet for Patients:   [facility] Fact Sheet for Healthcare Providers: 08/31/19 This test is not yet approved or cleared  by the [employer] FDA and has been authorized for detection and/or diagnosis of SARS-CoV-2 by FDA under an Emergency Use Authorization (EUA).  This EUA will remain in effect (meaning this test can be used) for the duration of the COVID-19 declaration  under Section 564(b)(1) of the Act, 21 U.S.C. section 360bbb-3(b)(1), unless the authorization is terminated or revoked sooner. Performed at [facility], [address], [place], [place] [address]          Radiology Studies: No results found.      Scheduled Meds: . calcium carbonate  1,250 mg Oral Q breakfast  . cholecalciferol  2,000 Units Oral Daily  . enoxaparin (LOVENOX) injection  40 mg Subcutaneous Q24H  . folic acid  500 mcg Oral Daily  . furosemide  40 mg Oral Daily  . gabapentin  100 mg Oral BID  . insulin pump   Subcutaneous TID WC, HS, 0200  . levothyroxine  50 mcg Oral QAC breakfast  . losartan  100 mg Oral Daily  . metoprolol tartrate  50 mg Oral BID  . NIFEdipine  60 mg Oral Daily  . simvastatin  20 mg Oral q1800  . sodium chloride flush  3 mL Intravenous Q12H  . sodium chloride flush  3 mL Intravenous Q12H   Continuous Infusions: . sodium chloride    . sodium chloride 1 mL/kg/hr (09/12/2019  0804)     LOS: 3 days    Time spent: 35 minutes    Perrion Diesel A Keyuana Wank, MD [facility]   If 7PM-7AM, please contact night-coverage [email or web address]  Sep 12, 2019, 3:27 PM

## 2019-09-01 NOTE — Interval H&P Note (Signed)
Cath Lab Visit (complete for each Cath Lab visit)  Clinical Evaluation Leading to the Procedure:   ACS: Yes.    Non-ACS:    Anginal Classification: CCS II  Anti-ischemic medical therapy: Maximal Therapy (2 or more classes of medications)  Non-Invasive Test Results: No non-invasive testing performed  Prior CABG: Previous CABG      History and Physical Interval Note:  08/18/2019 4:25 PM  Nancy Mcguire  has presented today for surgery, with the diagnosis of aortic stenosis.  The various methods of treatment have been discussed with the patient and family. After consideration of risks, benefits and other options for treatment, the patient has consented to  Procedure(s): RIGHT/LEFT HEART CATH AND CORONARY/GRAFT ANGIOGRAPHY (N/A) as a surgical intervention.  The patient's history has been reviewed, patient examined, no change in status, stable for surgery.  I have reviewed the patient's chart and labs.  Questions were answered to the patient's satisfaction.     Nanetta Batty

## 2019-09-01 NOTE — Evaluation (Signed)
Physical Therapy Evaluation Patient Details Name: Nancy Mcguire MRN: [id number] DOB: [birth year] Today's Date: 09/10/2019   History of Present Illness  Pt is an 86 y/o female admitted secondary to CHF exacerbation. Was also found to have severe aortic stenosis and is being assessed for TAVR. Pt for heart cath on 09/10/19. PMH includes HTN, CAD s/p CABG, CHF, and DM.   Clinical Impression  Pt admitted secondary to problem above with deficits below. Required min to min guard A for mobility tasks this session with and without RW. Improved steadiness noted with use of RW. Pt being assessed for TAVR, so will need to have pre-TAVR assessment performed during next session. Will continue to follow acutely to maximize functional mobility independence and safety.     Follow Up Recommendations Home health PT;Supervision for mobility/OOB    Equipment Recommendations  None recommended by PT    Recommendations for Other Services       Precautions / Restrictions Precautions Precautions: Fall Restrictions Weight Bearing Restrictions: No      Mobility  Bed Mobility Overal bed mobility: Needs Assistance Bed Mobility: Supine to Sit     Supine to sit: Supervision     General bed mobility comments: Supervision for safety.   Transfers Overall transfer level: Needs assistance Equipment used: None Transfers: Sit to/from Stand Sit to Stand: Min assist         General transfer comment: Min A for steadying to stand without AD. Used RW for remainder of mobility.   Ambulation/Gait Ambulation/Gait assistance: Min guard Gait Distance (Feet): 100 Feet Assistive device: Rolling walker (2 wheeled) Gait Pattern/deviations: Step-through pattern;Decreased stride length;Trunk flexed Gait velocity: Decreased   General Gait Details: Overall steady gait with use of RW. Slow and guarded. Min guard for safety.   Stairs            Wheelchair Mobility    Modified Rankin (Stroke Patients Only)        Balance Overall balance assessment: Needs assistance Sitting-balance support: No upper extremity supported;Feet supported Sitting balance-Leahy Scale: Good     Standing balance support: Bilateral upper extremity supported;During functional activity Standing balance-Leahy Scale: Poor Standing balance comment: Reliant on BUE support                              Pertinent Vitals/Pain Pain Assessment: No/denies pain    Home Living Family/patient expects to be discharged to:: Private residence Living Arrangements: Spouse/significant other Available Help at Discharge: Family;Available 24 hours/day Type of Home: House Home Access: Stairs to enter   Entrance Stairs-Number of Steps: 1(threshold) Home Layout: One level Home Equipment: Walker - 4 wheels;Cane - single point;Shower seat - built in      Prior Function Level of Independence: Independent with assistive device(s)         Comments: Used rollator vs cane for mobility outside of home      Hand Dominance        Extremity/Trunk Assessment   Upper Extremity Assessment Upper Extremity Assessment: Overall WFL for tasks assessed    Lower Extremity Assessment Lower Extremity Assessment: Generalized weakness    Cervical / Trunk Assessment Cervical / Trunk Assessment: Kyphotic  Communication   Communication: HOH  Cognition Arousal/Alertness: Awake/alert Behavior During Therapy: WFL for tasks assessed/performed Overall Cognitive Status: Within Functional Limits for tasks assessed  General Comments General comments (skin integrity, edema, etc.): Pt's husband present during session.     Exercises     Assessment/Plan    PT Assessment Patient needs continued PT services  PT Problem List Decreased strength;Decreased balance;Decreased mobility;Cardiopulmonary status limiting activity       PT Treatment Interventions DME instruction;Gait  training;Functional mobility training;Therapeutic exercise;Therapeutic activities;Balance training;Stair training;Patient/family education    PT Goals (Current goals can be found in the Care Plan section)  Acute Rehab PT Goals Patient Stated Goal: to go home PT Goal Formulation: With patient Time For Goal Achievement: 09/15/19 Potential to Achieve Goals: Good    Frequency Min 3X/week   Barriers to discharge        Co-evaluation               AM-PAC PT "6 Clicks" Mobility  Outcome Measure Help needed turning from your back to your side while in a flat bed without using bedrails?: None Help needed moving from lying on your back to sitting on the side of a flat bed without using bedrails?: None Help needed moving to and from a bed to a chair (including a wheelchair)?: A Little Help needed standing up from a chair using your arms (e.g., wheelchair or bedside chair)?: A Little Help needed to walk in hospital room?: A Little Help needed climbing 3-5 steps with a railing? : A Lot 6 Click Score: 19    End of Session Equipment Utilized During Treatment: Gait belt Activity Tolerance: Patient tolerated treatment well Patient left: in bed;with call bell/phone within reach;with family/visitor present Nurse Communication: Mobility status PT Visit Diagnosis: Muscle weakness (generalized) (M62.81)    Time: 5188-4166 PT Time Calculation (min) (ACUTE ONLY): 16 min   Charges:   PT Evaluation $PT Eval Moderate Complexity: 1 Mod          Cindee Salt, DPT  Acute Rehabilitation Services  Pager: [phone] Office: [phone]   Nancy Mcguire Sep 09, 2019, 5:16 PM

## 2019-09-01 NOTE — Progress Notes (Signed)
   Structural heart team to evaluate this morning for potential TAVR given her severe symptomatic aortic stenosis.  Appreciate their assistance.  Kaity Pitstick, MD  

## 2019-09-01 NOTE — H&P (View-Only) (Signed)
   Structural heart team to evaluate this morning for potential TAVR given her severe symptomatic aortic stenosis.  Appreciate their assistance.  Donato Schultz, MD

## 2019-09-01 NOTE — Progress Notes (Signed)
Progress Note  Patient Name: Nancy Mcguire Date of Encounter: 09/08/2019  Primary Cardiologist: Quay Burow, MD   Subjective   Pt feels well. TAVR team has already been in today. Planning for heart cath this afternoon.  Inpatient Medications    Scheduled Meds: . calcium carbonate  1,250 mg Oral Q breakfast  . cholecalciferol  2,000 Units Oral Daily  . enoxaparin (LOVENOX) injection  40 mg Subcutaneous Q24H  . folic acid  308 mcg Oral Daily  . furosemide  40 mg Oral Daily  . gabapentin  100 mg Oral BID  . insulin pump   Subcutaneous TID WC, HS, 0200  . levothyroxine  50 mcg Oral QAC breakfast  . losartan  100 mg Oral Daily  . metoprolol tartrate  50 mg Oral BID  . NIFEdipine  60 mg Oral Daily  . simvastatin  20 mg Oral q1800  . sodium chloride flush  3 mL Intravenous Q12H  . sodium chloride flush  3 mL Intravenous Q12H   Continuous Infusions: . sodium chloride    . sodium chloride 1 mL/kg/hr (08/17/2019 0804)   PRN Meds: sodium chloride, acetaminophen, sodium chloride flush   Vital Signs    Vitals:   08/31/19 0351 08/31/19 1136 08/31/19 1950 08/20/2019 0559  BP: (!) 150/77 133/62 122/62 (!) 146/67  Pulse: 70 69 81 80  Resp: 18 16 (!) 21 20  Temp: 98.1 F (36.7 C) (!) 97.5 F (36.4 C) 99.8 F (37.7 C) 98 F (36.7 C)  TempSrc: Oral Oral Oral Oral  SpO2: 90% 93% 95% 100%  Weight: 51.3 kg   51.1 kg  Height:        Intake/Output Summary (Last 24 hours) at 08/17/2019 6578 Last data filed at 08/31/2019 2109 Gross per 24 hour  Intake 723 ml  Output -  Net 723 ml   Last 3 Weights 08/21/2019 08/31/2019 08/30/2019  Weight (lbs) 112 lb 9.6 oz 113 lb 1.6 oz 120 lb  Weight (kg) 51.075 kg 51.302 kg 54.432 kg      Telemetry    Sinus rhythm HR in the 80s - Personally Reviewed  ECG    No new tracings - Personally Reviewed  Physical Exam   GEN: No acute distress.   Neck: No JVD Cardiac: RRR, 5/6 systolic murmur Respiratory: Clear to auscultation bilaterally.  GI: Soft, nontender, non-distended  MS: No edema; No deformity. Neuro:  Nonfocal  Psych: Normal affect   Labs    High Sensitivity Troponin:   Recent Labs  Lab Sep 26, 2019 1220 09-26-19 1539  TROPONINIHS 21* 26*      Chemistry Recent Labs  Lab 08/30/19 0442 08/31/19 0440 09/05/2019 0558  NA 136 137 134*  K 4.0 3.2* 3.9  CL 103 100 95*  CO2 26 27 26   GLUCOSE 215* 100* 282*  BUN 16 20 23   CREATININE 0.84 0.97 1.16*  CALCIUM 8.5* 8.8* 8.9  ALBUMIN  --  3.0*  --   GFRNONAA >60 53* 42*  GFRAA >60 >60 49*  ANIONGAP 7 10 13      Hematology Recent Labs  Lab 08/30/19 0442 08/31/19 0440 09/10/2019 0558  WBC 4.9 5.3 9.0  RBC 3.51* 3.62* 3.81*  HGB 10.5* 10.8* 11.4*  HCT 33.2* 34.1* 36.1  MCV 94.6 94.2 94.8  MCH 29.9 29.8 29.9  MCHC 31.6 31.7 31.6  RDW 16.3* 16.2* 16.2*  PLT 200 222 251    BNP Recent Labs  Lab 2019-09-26 1539  BNP 1,024.0*     DDimer No results for  input(s): DDIMER in the last 168 hours.   Radiology    No results found.  Cardiac Studies   TTE 08/30/19 IMPRESSIONS  1. Left ventricular ejection fraction, by estimation, is 60 to 65%. The  left ventricle has normal function. The left ventricle has no regional  wall motion abnormalities. There is mild left ventricular hypertrophy.  Left ventricular diastolic parameters  are consistent with Grade II diastolic dysfunction (pseudonormalization).  Elevated left ventricular end-diastolic pressure.  2. Right ventricular systolic function is mildly reduced. The right  ventricular size is normal. There is severely elevated pulmonary artery  systolic pressure. The estimated right ventricular systolic pressure is  82.2 mmHg.  3. Left atrial size was mildly dilated.  4. Right atrial size was mildly dilated.  5. The mitral valve is abnormal. Mild to moderate mitral valve  regurgitation.  6. Tricuspid valve regurgitation is mild to moderate.  7. The aortic valve is tricuspid. Aortic valve  regurgitation is trivial.  Severe aortic valve stenosis. Aortic valve area, by VTI measures 0.36 cm.  Aortic valve mean gradient measures 51.0 mmHg. Aortic valve Vmax measures  4.68 m/s.  8. The inferior vena cava is dilated in size with <50% respiratory  variability, suggesting right atrial pressure of 15 mmHg.   Patient Profile     86 y.o. female with a hx of HTN, CAD (CABG X3 26 yrs ago), DM-1 (on insulin pump) and AS with echo 01/2019 with AVarea by VTI of 0.51 who is being seen today for the evaluation of SOB and severe AS.  Assessment & Plan    Severe symptomatic aortic stenosis - structural heart team to see today - likely contributing to dyspnea - plan for heart cath this afternoon   Acute diastolic heart failure Pulmonary hypertension - diuresing on 40 mg PO lasix daily - overall net negative 1.7 L, I&O incomplete - weight is 112 lbs from 120 lbs on admission   CAD s/p CABG 20 years ago in [place] - normal nuclear stress test in 2013   CKD stage III - sCr 1.16, K 3.9   Hypertension - continue 100 mg losartan, 50 mg lopressor BID, procardia 60 mg   Hyperlipidemia - 01/06/2019: Cholesterol, Total 139; HDL 72; LDL Chol Calc (NIH) 54; Triglycerides 62 - continue zocor      For questions or updates, please contact [facility] Please consult [email or web address] for contact info under        Signed, Marcelino Duster, PA  09-09-2019, 9:38 AM

## 2019-09-01 NOTE — Progress Notes (Signed)
Inpatient Diabetes Program Recommendations  AACE/ADA: New Consensus Statement on Inpatient Glycemic Control (2015)  Target Ranges:  Prepandial:   less than 140 mg/dL      Peak postprandial:   less than 180 mg/dL (1-2 hours)      Critically ill patients:  140 - 180 mg/dL   Lab Results  Component Value Date   GLUCAP 260 (H) 08/15/2019    Review of Glycemic Control Results for Nancy Mcguire, Nancy Mcguire (MRN [id number]) as of 09/07/2019 13:27  Ref. Range 08/31/2019 15:56 08/31/2019 21:44 08/15/2019 03:45 09/06/2019 06:02 09/06/2019 11:11  Glucose-Capillary Latest Ref Range: 70 - 99 mg/dL 307 (H) 354 (H) 301 (H) 255 (H) 260 (H)   Diabetes history:  DM1  Outpatient Diabetes medications:  Insulin pump   Current orders for Inpatient glycemic control:  Insulin pump  Inpatient Diabetes Program Recommendations:     Patient is currently NPO for cardiac catheter today.  CBG's have been trending up today.  Insulin pump site was changed on Tuesday.  Spoke with patient and husband and recommended they change her site today.  Spouse is going to go home and get supplies while patient is having procedure.    Thank you, Dulce Sellar, RN, BSN Diabetes Coordinator Inpatient Diabetes Program [phone] (team pager from 8a-5p)

## 2019-09-02 ENCOUNTER — Inpatient Hospital Stay (HOSPITAL_COMMUNITY): Payer: Medicare Other

## 2019-09-02 DIAGNOSIS — I350 Nonrheumatic aortic (valve) stenosis: Secondary | ICD-10-CM

## 2019-09-02 DIAGNOSIS — R0600 Dyspnea, unspecified: Secondary | ICD-10-CM

## 2019-09-02 LAB — CBC
HCT: 33.1 % — ABNORMAL LOW (ref 36.0–46.0)
Hemoglobin: 10.7 g/dL — ABNORMAL LOW (ref 12.0–15.0)
MCH: 30.7 pg (ref 26.0–34.0)
MCHC: 32.3 g/dL (ref 30.0–36.0)
MCV: 95.1 fL (ref 80.0–100.0)
Platelets: 224 10*3/uL (ref 150–400)
RBC: 3.48 MIL/uL — ABNORMAL LOW (ref 3.87–5.11)
RDW: 16.1 % — ABNORMAL HIGH (ref 11.5–15.5)
WBC: 9.4 10*3/uL (ref 4.0–10.5)
nRBC: 0 % (ref 0.0–0.2)

## 2019-09-02 LAB — BASIC METABOLIC PANEL
Anion gap: 14 (ref 5–15)
BUN: 22 mg/dL (ref 8–23)
CO2: 21 mmol/L — ABNORMAL LOW (ref 22–32)
Calcium: 8.5 mg/dL — ABNORMAL LOW (ref 8.9–10.3)
Chloride: 101 mmol/L (ref 98–111)
Creatinine, Ser: 0.99 mg/dL (ref 0.44–1.00)
GFR calc Af Amer: 59 mL/min — ABNORMAL LOW (ref >60)
GFR calc non Af Amer: 51 mL/min — ABNORMAL LOW (ref >60)
Glucose, Bld: 257 mg/dL — ABNORMAL HIGH (ref 70–99)
Potassium: 3.5 mmol/L (ref 3.5–5.1)
Sodium: 136 mmol/L (ref 135–145)

## 2019-09-02 LAB — GLUCOSE, CAPILLARY
Glucose-Capillary: 236 mg/dL — ABNORMAL HIGH (ref 70–99)
Glucose-Capillary: 238 mg/dL — ABNORMAL HIGH (ref 70–99)
Glucose-Capillary: 298 mg/dL — ABNORMAL HIGH (ref 70–99)
Glucose-Capillary: 212 mg/dL — ABNORMAL HIGH (ref 70–99)
Glucose-Capillary: 187 mg/dL — ABNORMAL HIGH (ref 70–99)

## 2019-09-02 MED ORDER — IOHEXOL 350 MG/ML SOLN
80.0000 mL | Freq: Once | INTRAVENOUS | Status: AC | PRN
  Administered 2019-09-02: 80 mL via INTRAVENOUS

## 2019-09-02 MED ORDER — LACTULOSE 10 GM/15ML PO SOLN
20.0000 g | Freq: Every day | ORAL | Status: DC
  Administered 2019-09-02 – 2019-09-04 (×3): 20 g via ORAL
  Filled 2019-09-02 (×3): qty 30

## 2019-09-02 MED ORDER — SENNOSIDES-DOCUSATE SODIUM 8.6-50 MG PO TABS
1.0000 | ORAL_TABLET | Freq: Two times a day (BID) | ORAL | Status: DC
  Administered 2019-09-02 – 2019-09-09 (×14): 1 via ORAL
  Filled 2019-09-02 (×17): qty 1

## 2019-09-02 MED FILL — Verapamil HCl IV Soln 2.5 MG/ML: INTRAVENOUS | Qty: 2 | Status: AC

## 2019-09-02 NOTE — Care Management Important Message (Signed)
Important Message  Patient Details  Name: Nancy Mcguire MRN: [id number] Date of Birth: 1929-10-10   Medicare Important Message Given:  Yes     Renie Ora 09/02/2019, 10:40 AM

## 2019-09-02 NOTE — Plan of Care (Signed)

## 2019-09-02 NOTE — Progress Notes (Signed)
Inpatient Diabetes Program Recommendations  AACE/ADA: New Consensus Statement on Inpatient Glycemic Control (2015)  Target Ranges:  Prepandial:   less than 140 mg/dL      Peak postprandial:   less than 180 mg/dL (1-2 hours)      Critically ill patients:  140 - 180 mg/dL   Lab Results  Component Value Date   GLUCAP 298 (H) 09/02/2019    Review of Glycemic Control Results for Nancy Mcguire, Nancy Mcguire (MRN [id number]) as of 09/02/2019 14:07  Ref. Range 08-Sep-2019 06:02 Sep 08, 2019 11:11 09/08/2019 21:18 09/02/2019 03:58 09/02/2019 06:04 09/02/2019 11:18  Glucose-Capillary Latest Ref Range: 70 - 99 mg/dL 656 (H) 812 (H) 751 (H) 236 (H) 238 (H) 298 (H)   Diabetes history: DM type 1 managed by Dr. Corwin Levins, [facility] End[facility]ology Outpatient Diabetes medications: insulin pump Current orders for Inpatient glycemic control: insulin pump order set  Inpatient Diabetes Program Recommendations:    Pt changed pump site this am. Spoke with pt and husband regarding glucose trend while here. Discussed plan for possible IV insulin if her needs are higher during and around surgery. Will follow glucose trends closely.  Pt and husband reports she is a brittle DM will need to titrate slowly if placed on SQ regimen.  If glucose trends remain elevated. Pt may need to come off insulin pump and titrate SQ regimen starting with the equivalent of what her insulin pump gives her.  (Lantus 13-14 units Q24 hours, Novolog 0-6 units tid + hs, Novolog 2 units tid meal coverage if eating >50% of meals)  Thanks,  Christena Deem RN, MSN, BC-ADM Inpatient Diabetes Coordinator Team Pager [phone] (8a-5p)

## 2019-09-02 NOTE — Progress Notes (Signed)
PROGRESS NOTE    Nancy Mcguire  JTT:[id number] DOB: 23-Aug-1929 DOA: 09/08/2019 PCP: Angelica Chessman, MD   Brief Narrative: 86 year old with past medical history significant for diastolic heart failure not on diuretics, moderate to severe pulmonary hypertension, severe aortic stenosis, heart murmur, CAD, carotid artery stenosis, CKD stage III AAA, diabetes type 2 on insulin pump, hypothyroidism, chronic back pain who presented with worsening shortness of breath and dry cough. Patient was admitted with acute on chronic diastolic heart failure exacerbation, chest x-ray showed cardiomegaly and small bilateral pleural effusion, CTA negative for PE, BNP 1024.   Assessment & Plan:   Active Problems:   Hyperlipidemia   Essential hypertension   Coronary artery disease   Diabetes type 1, controlled (HCC)   Cardiac murmur   Volume overload   Severe aortic stenosis   Acute diastolic heart failure (HCC)   Aortic atherosclerosis (HCC)  1-Chronic diastolic heart failure exacerbation, severe pulmonary hypertension: Echo with ejection fraction 60 to 65%, severe aortic valve stenosis Receive IV Lasix 40 mg IV twice daily. Weight 120---112---111 Lasix to oral 40 mg daily Cardiology consulted Stable.   2-Aortic Valve Severe Stenosis: Cardiology consulted Cardiology has consulted TAVR team for further evaluation. Had Cath: LIMA and Circumflex graft open, diagonal graft with obstruction.  Plan for CT scans today , CVTS sx consult Monday, TVAR on Tuesday.   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 Denies chest pain  Controlled diabetes type 1 with hyperglycemia: Continue insulin pump CBG increasing. Appreciate Diabetes educator follow up.    CKD stage III a; Monitor on diuretics.  Essential hypertension: Continue with losartan and metoprolol.  Hypothyroidism continue with Synthroid.  Pulmonary HTN; cardiology consulted.   Hypomagnesemia: Replete.  Constipation: start senna, lactulose.   Estimated body mass index is 20.36 kg/m as calculated from the following:   Height as of this encounter: 5\' 2"  (1.575 m).   Weight as of this encounter: 50.5 kg.   DVT prophylaxis: Lovenox Code Status: DNR DNI Family Communication: Daughter and patient husband at bedside Disposition Plan:  Status is: Inpatient  AWaiting cardiology evaluation for aortic valvular stenosis, transition from IV to oral Lasix  Dispo: The patient is from: Home              Anticipated d/c is to: Home               Anticipated d/c date is: 2 or 3 days              Patient currently; not medical stable for discharge        Consultants:   Cardiology  Procedures:   Echo  Antimicrobials:  None  Subjective: She vomiting last night.  No bowel movement on several days   Objective: Vitals:   09/02/19 0413 09/02/19 0753 09/02/19 0845 09/02/19 1123  BP: 112/62  (!) 99/57 (!) 115/57  Pulse: 88   73  Resp: 18  14 16   Temp: 97.7 F (36.5 C)   97.7 F (36.5 C)  TempSrc: Oral   Oral  SpO2: (!) 64% 93% 94% 92%  Weight: 50.5 kg     Height:        Intake/Output Summary (Last 24 hours) at 09/02/2019 1146 Last data filed at 09/02/2019 1100 Gross per 24 hour  Intake 695.68 ml  Output 1450 ml  Net -754.32 ml   Filed Weights   08/31/19 0351 2019/09/24 0559 09/02/19 0413  Weight: 51.3 kg 51.1 kg 50.5 kg  Examination:  General exam: NAD Respiratory system: CTA Cardiovascular system: S 1, S 2 RRR, systolic murmur Gastrointestinal system: BS present, soft, nt Central nervous system: alert, walking in the room Extremities: No edema   Data Reviewed: I have personally reviewed following labs and imaging studies  CBC: Recent Labs  Lab 09/02/2019 1220 09/05/2019 1220 08/30/19 0442 08/30/19 0442 08/31/19 0440 08/31/19 0440 Sep 12, 2019 0558 09-12-2019 1657 09/12/2019 1658 09/12/2019 1702 09/02/19 0426  WBC 5.1  --  4.9  --  5.3  --  9.0  --   --   --  9.4   HGB 10.9*   < > 10.5*   < > 10.8*   < > 11.4* 10.5* 10.5* 10.2* 10.7*  HCT 35.4*   < > 33.2*   < > 34.1*   < > 36.1 31.0* 31.0* 30.0* 33.1*  MCV 97.0  --  94.6  --  94.2  --  94.8  --   --   --  95.1  PLT 219  --  200  --  222  --  251  --   --   --  224   < > = values in this interval not displayed.   Basic Metabolic Panel: Recent Labs  Lab 08/16/2019 1220 09/07/2019 1220 08/30/19 0442 08/30/19 0442 08/31/19 0440 08/31/19 0440 09/12/19 0558 09/12/19 1657 Sep 12, 2019 1658 2019-09-12 1702 09/02/19 0426  NA 135   < > 136   < > 137   < > 134* 138 138 138 136  K 4.9   < > 4.0   < > 3.2*   < > 3.9 3.4* 3.4* 3.2* 3.5  CL 104  --  103  --  100  --  95*  --   --   --  101  CO2 22  --  26  --  27  --  26  --   --   --  21*  GLUCOSE 262*  --  215*  --  100*  --  282*  --   --   --  257*  BUN 25*  --  16  --  20  --  23  --   --   --  22  CREATININE 0.99  --  0.84  --  0.97  --  1.16*  --   --   --  0.99  CALCIUM 8.5*  --  8.5*  --  8.8*  --  8.9  --   --   --  8.5*  MG  --   --   --   --  1.5*  --   --   --   --   --   --   PHOS  --   --   --   --  3.9  --   --   --   --   --   --    < > = values in this interval not displayed.   GFR: Estimated Creatinine Clearance: 31.7 mL/min (by C-G formula based on SCr of 0.99 mg/dL). Liver Function Tests: Recent Labs  Lab 08/31/19 0440  ALBUMIN 3.0*   No results for input(s): LIPASE, AMYLASE in the last 168 hours. No results for input(s): AMMONIA in the last 168 hours. Coagulation Profile: No results for input(s): INR, PROTIME in the last 168 hours. Cardiac Enzymes: No results for input(s): CKTOTAL, CKMB, CKMBINDEX, TROPONINI in the last 168 hours. BNP (last 3 results) No results for input(s): PROBNP in the last  8760 hours. HbA1C: No results for input(s): HGBA1C in the last 72 hours. CBG: Recent Labs  Lab 08/20/2019 1111 08/27/2019 2118 09/02/19 0358 09/02/19 0604 09/02/19 1118  GLUCAP 260* 162* 236* 238* 298*   Lipid Profile: No results  for input(s): CHOL, HDL, LDLCALC, TRIG, CHOLHDL, LDLDIRECT in the last 72 hours. Thyroid Function Tests: No results for input(s): TSH, T4TOTAL, FREET4, T3FREE, THYROIDAB in the last 72 hours. Anemia Panel: No results for input(s): VITAMINB12, FOLATE, FERRITIN, TIBC, IRON, RETICCTPCT in the last 72 hours. Sepsis Labs: No results for input(s): PROCALCITON, LATICACIDVEN in the last 168 hours.  Recent Results (from the past 240 hour(s))  SARS Coronavirus 2 by RT PCR (hospital order, performed in [facility] hospital lab) Nasopharyngeal Nasopharyngeal Swab     Status: None   Collection Time: 08/24/2019  7:56 PM   Specimen: Nasopharyngeal Swab  Result Value Ref Range Status   SARS Coronavirus 2 NEGATIVE NEGATIVE Final    Comment: (NOTE) SARS-CoV-2 target nucleic acids are NOT DETECTED. The SARS-CoV-2 RNA is generally detectable in upper and lower respiratory specimens during the acute phase of infection. The lowest concentration of SARS-CoV-2 viral copies this assay can detect is 250 copies / mL. A negative result does not preclude SARS-CoV-2 infection and should not be used as the sole basis for treatment or other patient management decisions.  A negative result may occur with improper specimen collection / handling, submission of specimen other than nasopharyngeal swab, presence of viral mutation(s) within the areas targeted by this assay, and inadequate number of viral copies (<250 copies / mL). A negative result must be combined with clinical observations, patient history, and epidemiological information. Fact Sheet for Patients:   BoilerBrush.com.cy Fact Sheet for Healthcare Providers: https://pope.com/ This test is not yet approved or cleared  by the [place] FDA and has been authorized for detection and/or diagnosis of SARS-CoV-2 by FDA under an Emergency Use Authorization (EUA).  This EUA will remain in effect (meaning this test  can be used) for the duration of the COVID-19 declaration under Section 564(b)(1) of the Act, 21 U.S.C. section 360bbb-3(b)(1), unless the authorization is terminated or revoked sooner. Performed at [facility], [address] [place], [place] [address]          Radiology Studies: CARDIAC CATHETERIZATION  Result Date: 09/11/2019  Prox LAD to Mid LAD lesion is 90% stenosed.  Mid LAD-1 lesion is 90% stenosed.  Mid LAD-2 lesion is 90% stenosed.  Origin to Prox Graft lesion is 100% stenosed.  Ost Cx to Prox Cx lesion is 90% stenosed.  2nd Mrg lesion is 90% stenosed.  Mid Graft to Dist Graft lesion is 60% stenosed.  Prox Graft to Mid Graft lesion is 40% stenosed.  Hemodynamic findings consistent with aortic valve stenosis.  Lorice Lafave is a 86 y.o. female  [id number] LOCATION:  FACILITY: [facility] PHYSICIAN: Nanetta Batty, M.D. 01/19/1930 DATE OF PROCEDURE:  09/12/2019 DATE OF DISCHARGE: CARDIAC CATHETERIZATION History obtained from chart review.86 y.o. female with a hx of HTN, CAD (CABG X3 26 yrs ago), DM-1(on insulin pump) and AS with echo 01/2019 with AVarea by VTI of 0.51who is being seen today for the evaluation of SOB and severe AS.   Ms. Gavin has 2 of 3 grafts patent in the left dominant system.  The vein graft to the diagonal branch is occluded at the origin.  The LIMA to the LAD is patent with significant proximal LAD disease proximal to LIMA insertion.  The vein graft to  the circumflex has moderate disease in the mid and distal shaft.  The RCA is nondominant.  She has severe aortic stenosis with a valve area between 0.6 1.8 by Fick or thermal dilution and EDP of 18.  I did shoot her distal abdominal aorta revealing widely patent iliacs arteries.  I reviewed the films with Dr. Angelena Form who feels that she is a acceptable candidate for TAVR.  I performed angiography of the right common femoral artery and and successfully deployed a MYNX closure device in the right common femoral  artery and vein achieving hemostasis.  The patient left lab in stable condition. Quay Burow. MD, [facility] 08/16/2019 6:04 PM        Scheduled Meds: . aspirin  81 mg Oral Daily  . furosemide  40 mg Oral Daily  . insulin pump   Subcutaneous TID WC, HS, 0200  . lactulose  20 g Oral Daily  . levothyroxine  50 mcg Oral QAC breakfast  . losartan  100 mg Oral Daily  . metoprolol tartrate  50 mg Oral BID  . NIFEdipine  60 mg Oral Daily  . senna-docusate  1 tablet Oral BID  . simvastatin  20 mg Oral q1800  . sodium chloride flush  3 mL Intravenous Q12H   Continuous Infusions: . sodium chloride       LOS: 4 days    Time spent: 35 minutes    Belkys A Regalado, MD [facility]   If 7PM-7AM, please contact night-coverage [email or web address]  09/02/2019, 11:46 AM

## 2019-09-02 NOTE — Progress Notes (Signed)
  [facility]   MULTIDISCIPLINARY HEART VALVE TEAM  Plan for scans today. The pt is supposed to have annual carotid dopplers in [place] next month. I will just order here for her convenience. Dr. Cornelius Moras will see her in formal consultation Monday with tentative plans for TAVR on Tuesday.   Cline Crock PA-C  [facility]

## 2019-09-02 NOTE — Progress Notes (Signed)
Progress Note  Patient Name: Nancy Mcguire Date of Encounter: 09/02/2019  Primary Cardiologist: Nanetta Batty, MD   Subjective   Feeling better.  Less short of breath.  No chest pain.  Inpatient Medications    Scheduled Meds: . aspirin  81 mg Oral Daily  . furosemide  40 mg Oral Daily  . insulin pump   Subcutaneous TID WC, HS, 0200  . levothyroxine  50 mcg Oral QAC breakfast  . losartan  100 mg Oral Daily  . metoprolol tartrate  50 mg Oral BID  . NIFEdipine  60 mg Oral Daily  . simvastatin  20 mg Oral q1800  . sodium chloride flush  3 mL Intravenous Q12H   Continuous Infusions: . sodium chloride     PRN Meds: sodium chloride, acetaminophen, ondansetron (ZOFRAN) IV, sodium chloride flush   Vital Signs    Vitals:   09/02/19 0332 09/02/19 0413 09/02/19 0753 09/02/19 0845  BP: (!) 124/53 112/62  (!) 99/57  Pulse: 81 88    Resp: 18 18  14   Temp:  97.7 F (36.5 C)    TempSrc:  Oral    SpO2:  (!) 64% 93% 94%  Weight:  50.5 kg    Height:        Intake/Output Summary (Last 24 hours) at 09/02/2019 1013 Last data filed at 09/02/2019 0808 Gross per 24 hour  Intake 695.68 ml  Output 1250 ml  Net -554.32 ml   Last 3 Weights 09/02/2019 08/20/2019 08/31/2019  Weight (lbs) 111 lb 4.8 oz 112 lb 9.6 oz 113 lb 1.6 oz  Weight (kg) 50.485 kg 51.075 kg 51.302 kg      Telemetry    No adverse rhythm - Personally Reviewed  ECG    No new - Personally Reviewed  Physical Exam   GEN: No acute distress.   Neck: No JVD Cardiac: RRR, 4/6 SM,no rubs, or gallops.  Respiratory: Clear to auscultation bilaterally. GI: Soft, nontender, non-distended  MS: No edema; No deformity. Neuro:  Nonfocal  Psych: Normal affect   Labs    High Sensitivity Troponin:   Recent Labs  Lab 08/30/2019 1220 08/24/2019 1539  TROPONINIHS 21* 26*      Chemistry Recent Labs  Lab 08/31/19 0440 08/31/19 0440 09/04/2019 0558 08/23/2019 1657 09/10/2019 1658 08/13/2019 1702 09/02/19 0426  NA 137   < >  134*   < > 138 138 136  K 3.2*   < > 3.9   < > 3.4* 3.2* 3.5  CL 100  --  95*  --   --   --  101  CO2 27  --  26  --   --   --  21*  GLUCOSE 100*  --  282*  --   --   --  257*  BUN 20  --  23  --   --   --  22  CREATININE 0.97  --  1.16*  --   --   --  0.99  CALCIUM 8.8*  --  8.9  --   --   --  8.5*  ALBUMIN 3.0*  --   --   --   --   --   --   GFRNONAA 53*  --  42*  --   --   --  51*  GFRAA >60  --  49*  --   --   --  59*  ANIONGAP 10  --  13  --   --   --  14   < > = values in this interval not displayed.     Hematology Recent Labs  Lab 08/31/19 0440 08/31/19 0440 08/31/2019 0558 09/11/2019 1657 09/10/2019 1658 08/20/2019 1702 09/02/19 0426  WBC 5.3  --  9.0  --   --   --  9.4  RBC 3.62*  --  3.81*  --   --   --  3.48*  HGB 10.8*   < > 11.4*   < > 10.5* 10.2* 10.7*  HCT 34.1*   < > 36.1   < > 31.0* 30.0* 33.1*  MCV 94.2  --  94.8  --   --   --  95.1  MCH 29.8  --  29.9  --   --   --  30.7  MCHC 31.7  --  31.6  --   --   --  32.3  RDW 16.2*  --  16.2*  --   --   --  16.1*  PLT 222  --  251  --   --   --  224   < > = values in this interval not displayed.    BNP Recent Labs  Lab 2019/09/26 1539  BNP 1,024.0*     DDimer No results for input(s): DDIMER in the last 168 hours.   Radiology    CARDIAC CATHETERIZATION  Result Date: 09/12/2019  Prox LAD to Mid LAD lesion is 90% stenosed.  Mid LAD-1 lesion is 90% stenosed.  Mid LAD-2 lesion is 90% stenosed.  Origin to Prox Graft lesion is 100% stenosed.  Ost Cx to Prox Cx lesion is 90% stenosed.  2nd Mrg lesion is 90% stenosed.  Mid Graft to Dist Graft lesion is 60% stenosed.  Prox Graft to Mid Graft lesion is 40% stenosed.  Hemodynamic findings consistent with aortic valve stenosis.  Nancy Mcguire is a 86 y.o. female  [id number] LOCATION:  FACILITY: [facility] PHYSICIAN: Nanetta Batty, M.D. [birth year] DATE OF PROCEDURE:  09/08/2019 DATE OF DISCHARGE: CARDIAC CATHETERIZATION History obtained from chart review.86 y.o. female with a hx  of HTN, CAD (CABG X3 26 yrs ago), DM-1(on insulin pump) and AS with echo 01/2019 with AVarea by VTI of 0.51who is being seen today for the evaluation of SOB and severe AS.   [name] has 2 of 3 grafts patent in the left dominant system.  The vein graft to the diagonal branch is occluded at the origin.  The LIMA to the LAD is patent with significant proximal LAD disease proximal to LIMA insertion.  The vein graft to the circumflex has moderate disease in the mid and distal shaft.  The RCA is nondominant.  She has severe aortic stenosis with a valve area between 0.6 1.8 by Fick or thermal dilution and EDP of 18.  I did shoot her distal abdominal aorta revealing widely patent iliacs arteries.  I reviewed the films with Dr. Clifton James who feels that she is a acceptable candidate for TAVR.  I performed angiography of the right common femoral artery and and successfully deployed a MYNX closure device in the right common femoral artery and vein achieving hemostasis.  The patient left lab in stable condition. Nanetta Batty. MD, [facility] 09/06/2019 6:04 PM    Cardiac Studies    Prox LAD to Mid LAD lesion is 90% stenosed.  Mid LAD-1 lesion is 90% stenosed.  Mid LAD-2 lesion is 90% stenosed.  Origin to Prox Graft lesion is 100% stenosed.  Ost Cx to Prox Cx lesion is 90% stenosed.  2nd Mrg lesion is 90% stenosed.  Mid Graft to Dist Graft lesion is 60% stenosed.  Prox Graft to Mid Graft lesion is 40% stenosed.  Hemodynamic findings consistent with aortic valve stenosis.   Nancy Mcguire is a 86 y.o. female    [id number] LOCATION:  FACILITY: [place]  PHYSICIAN: Quay Burow, M.D. 11-29-1929   DATE OF PROCEDURE:  09/05/2019  DATE OF DISCHARGE:     CARDIAC CATHETERIZATION     History obtained from chart review.86 y.o.femalewith a hx of HTN, CAD (CABG X3 26 yrs ago), DM-1(on insulin pump) and AS with echo 01/2019 with AVarea by VTI of 0.51who is being seen today for the  evaluation of SOB and severe AS.   IMPRESSION: [name] has 2 of 3 grafts patent in the left dominant system.  The vein graft to the diagonal branch is occluded at the origin.  The LIMA to the LAD is patent with significant proximal LAD disease proximal to LIMA insertion.  The vein graft to the circumflex has moderate disease in the mid and distal shaft.  The RCA is nondominant.  She has severe aortic stenosis with a valve area between 0.6 1.8 by Fick or thermal dilution and EDP of 18.  I did shoot her distal abdominal aorta revealing widely patent iliacs arteries.  I reviewed the films with Dr. Angelena Form who feels that she is a acceptable candidate for TAVR.  I performed angiography of the right common femoral artery and and successfully deployed a MYNX closure device in the right common femoral artery and vein achieving hemostasis.  The patient left lab in stable condition.  Diagnostic Dominance: Left  Intervention    Quay Burow. MD, [facility]  Patient Profile     86 y.o. female with severe aortic stenosis symptomatic, multivessel coronary artery disease post CABG with patent LIMA to LAD and patent SVG to circumflex currently undergoing TAVR work-up.  Assessment & Plan    Severe symptomatic aortic stenosis -Appreciate TAVR team/structural heart team.  Heart catheterization performed yesterday as above.  Iliacs patent.  Seems appropriate for TAVR.  Continued plans per structural heart team.  Acute diastolic heart failure -Feels better after diuresis.  Lasix.  Secondary pulmonary hypertension -Right ventricular pressure 56 systolic Pulmonary artery pressure-54/17, mean 31 Pulmonary wedge pressure-A-wave 14, V wave 15, mean 12 LVEDP-18 -On echocardiogram estimated pressures were in the 80s.  This has improved with diuresis.  Aortic atherosclerosis -Continue with secondary prevention efforts  Mild to moderate mitral regurgitation -Should be of little clinical  significance  Coronary artery disease status post CABG 20 years ago in [place] -No ischemia on stress test 2013 -Cardiac catheterization as above, diagonal graft is down.  LIMA and circumflex graft are open.      For questions or updates, please contact [place] Please consult [email or web address] for contact info under        Signed, Candee Furbish, MD  09/02/2019, 10:13 AM

## 2019-09-03 ENCOUNTER — Inpatient Hospital Stay (HOSPITAL_COMMUNITY): Payer: Medicare Other

## 2019-09-03 DIAGNOSIS — Z01810 Encounter for preprocedural cardiovascular examination: Secondary | ICD-10-CM

## 2019-09-03 DIAGNOSIS — N179 Acute kidney failure, unspecified: Secondary | ICD-10-CM

## 2019-09-03 LAB — BASIC METABOLIC PANEL
Anion gap: 14 (ref 5–15)
BUN: 40 mg/dL — ABNORMAL HIGH (ref 8–23)
CO2: 22 mmol/L (ref 22–32)
Calcium: 8.3 mg/dL — ABNORMAL LOW (ref 8.9–10.3)
Chloride: 99 mmol/L (ref 98–111)
Creatinine, Ser: 2.38 mg/dL — ABNORMAL HIGH (ref 0.44–1.00)
GFR calc Af Amer: 21 mL/min — ABNORMAL LOW (ref >60)
GFR calc non Af Amer: 18 mL/min — ABNORMAL LOW (ref >60)
Glucose, Bld: 144 mg/dL — ABNORMAL HIGH (ref 70–99)
Potassium: 4.2 mmol/L (ref 3.5–5.1)
Sodium: 135 mmol/L (ref 135–145)

## 2019-09-03 LAB — CBC
HCT: 31.4 % — ABNORMAL LOW (ref 36.0–46.0)
Hemoglobin: 9.8 g/dL — ABNORMAL LOW (ref 12.0–15.0)
MCH: 29.6 pg (ref 26.0–34.0)
MCHC: 31.2 g/dL (ref 30.0–36.0)
MCV: 94.9 fL (ref 80.0–100.0)
Platelets: 191 10*3/uL (ref 150–400)
RBC: 3.31 MIL/uL — ABNORMAL LOW (ref 3.87–5.11)
RDW: 15.9 % — ABNORMAL HIGH (ref 11.5–15.5)
WBC: 9.1 10*3/uL (ref 4.0–10.5)
nRBC: 0 % (ref 0.0–0.2)

## 2019-09-03 LAB — GLUCOSE, CAPILLARY
Glucose-Capillary: 146 mg/dL — ABNORMAL HIGH (ref 70–99)
Glucose-Capillary: 158 mg/dL — ABNORMAL HIGH (ref 70–99)
Glucose-Capillary: 115 mg/dL — ABNORMAL HIGH (ref 70–99)
Glucose-Capillary: 172 mg/dL — ABNORMAL HIGH (ref 70–99)
Glucose-Capillary: 232 mg/dL — ABNORMAL HIGH (ref 70–99)

## 2019-09-03 LAB — CREATININE, SERUM
Creatinine, Ser: 2.69 mg/dL — ABNORMAL HIGH (ref 0.44–1.00)
GFR calc Af Amer: 18 mL/min — ABNORMAL LOW (ref >60)
GFR calc non Af Amer: 15 mL/min — ABNORMAL LOW (ref >60)

## 2019-09-03 MED ORDER — BISACODYL 10 MG RE SUPP
10.0000 mg | Freq: Once | RECTAL | Status: AC
  Administered 2019-09-03: 10 mg via RECTAL
  Filled 2019-09-03: qty 1

## 2019-09-03 MED ORDER — SODIUM CHLORIDE 0.9 % IV SOLN: INTRAVENOUS | Status: DC

## 2019-09-03 MED ORDER — HEPARIN SODIUM (PORCINE) 5000 UNIT/ML IJ SOLN
5000.0000 [IU] | Freq: Three times a day (TID) | INTRAMUSCULAR | Status: DC
  Administered 2019-09-03 – 2019-09-12 (×28): 5000 [IU] via SUBCUTANEOUS
  Filled 2019-09-03 (×28): qty 1

## 2019-09-03 NOTE — Progress Notes (Addendum)
Progress Note  Patient Name: Nancy Mcguire Date of Encounter: 09/03/2019  Primary Cardiologist:   Nanetta Batty, MD   Subjective   She denies pain or SOB.  She has not be able to urinate  Inpatient Medications    Scheduled Meds: . aspirin  81 mg Oral Daily  . bisacodyl  10 mg Rectal Once  . insulin pump   Subcutaneous TID WC, HS, 0200  . lactulose  20 g Oral Daily  . levothyroxine  50 mcg Oral QAC breakfast  . losartan  100 mg Oral Daily  . metoprolol tartrate  50 mg Oral BID  . NIFEdipine  60 mg Oral Daily  . senna-docusate  1 tablet Oral BID  . simvastatin  20 mg Oral q1800  . sodium chloride flush  3 mL Intravenous Q12H   Continuous Infusions: . sodium chloride    . sodium chloride     PRN Meds: sodium chloride, acetaminophen, ondansetron (ZOFRAN) IV, sodium chloride flush   Vital Signs    Vitals:   09/02/19 2119 09/03/19 0400 09/03/19 0659 09/03/19 0840  BP: (!) 97/51  (!) 107/57 (!) 106/59  Pulse: 72  86 87  Resp:   19 20  Temp:    98.1 F (36.7 C)  TempSrc:    Oral  SpO2: 100%  90% 91%  Weight:  51 kg    Height:        Intake/Output Summary (Last 24 hours) at 09/03/2019 1000 Last data filed at 09/03/2019 0030 Gross per 24 hour  Intake 118 ml  Output 600 ml  Net -482 ml   Filed Weights   September 14, 2019 0559 09/02/19 0413 09/03/19 0400  Weight: 51.1 kg 50.5 kg 51 kg    Telemetry    NSR - Personally Reviewed  ECG    NA - Personally Reviewed  Physical Exam   GEN: No acute distress.   Neck: No  JVD Cardiac: RRR, 3/6 apical systolic murmur, no diastolic murmurs, rubs, or gallops.  Respiratory: Clear  to auscultation bilaterally. GI: Soft, nontender, non-distended  MS: No  edema; No deformity. Neuro:  Nonfocal  Psych: Normal affect   Labs    Chemistry Recent Labs  Lab 08/31/19 0440 08/31/19 0440 09/14/19 0558 09/14/19 1657 09-14-2019 1702 09/02/19 0426 09/03/19 0802  NA 137   < > 134*   < > 138 136 135  K 3.2*   < > 3.9   < > 3.2*  3.5 4.2  CL 100   < > 95*  --   --  101 99  CO2 27   < > 26  --   --  21* 22  GLUCOSE 100*   < > 282*  --   --  257* 144*  BUN 20   < > 23  --   --  22 40*  CREATININE 0.97   < > 1.16*  --   --  0.99 2.38*  CALCIUM 8.8*   < > 8.9  --   --  8.5* 8.3*  ALBUMIN 3.0*  --   --   --   --   --   --   GFRNONAA 53*   < > 42*  --   --  51* 18*  GFRAA >60   < > 49*  --   --  59* 21*  ANIONGAP 10   < > 13  --   --  14 14   < > = values in this interval not displayed.  Hematology Recent Labs  Lab Sep 03, 2019 0558 2019-09-03 1657 09-03-19 1702 09/02/19 0426 09/03/19 0802  WBC 9.0  --   --  9.4 9.1  RBC 3.81*  --   --  3.48* 3.31*  HGB 11.4*   < > 10.2* 10.7* 9.8*  HCT 36.1   < > 30.0* 33.1* 31.4*  MCV 94.8  --   --  95.1 94.9  MCH 29.9  --   --  30.7 29.6  MCHC 31.6  --   --  32.3 31.2  RDW 16.2*  --   --  16.1* 15.9*  PLT 251  --   --  224 191   < > = values in this interval not displayed.    Cardiac EnzymesNo results for input(s): TROPONINI in the last 168 hours. No results for input(s): TROPIPOC in the last 168 hours.   BNP Recent Labs  Lab 08/13/2019 1539  BNP 1,024.0*     DDimer No results for input(s): DDIMER in the last 168 hours.   Radiology    CARDIAC CATHETERIZATION  Result Date: Sep 03, 2019  Prox LAD to Mid LAD lesion is 90% stenosed.  Mid LAD-1 lesion is 90% stenosed.  Mid LAD-2 lesion is 90% stenosed.  Origin to Prox Graft lesion is 100% stenosed.  Ost Cx to Prox Cx lesion is 90% stenosed.  2nd Mrg lesion is 90% stenosed.  Mid Graft to Dist Graft lesion is 60% stenosed.  Prox Graft to Mid Graft lesion is 40% stenosed.  Hemodynamic findings consistent with aortic valve stenosis.  Nancy Mcguire is a 86 y.o. female  [id number] LOCATION:  FACILITY: [facility] PHYSICIAN: Nanetta Batty, M.D. 15-Aug-1929 DATE OF PROCEDURE:  09-03-19 DATE OF DISCHARGE: CARDIAC CATHETERIZATION History obtained from chart review.86 y.o. female with a hx of HTN, CAD (CABG X3 26 yrs ago), DM-1(on  insulin pump) and AS with echo 01/2019 with AVarea by VTI of 0.51who is being seen today for the evaluation of SOB and severe AS.   [name] has 2 of 3 grafts patent in the left dominant system.  The vein graft to the diagonal branch is occluded at the origin.  The LIMA to the LAD is patent with significant proximal LAD disease proximal to LIMA insertion.  The vein graft to the circumflex has moderate disease in the mid and distal shaft.  The RCA is nondominant.  She has severe aortic stenosis with a valve area between 0.6 1.8 by Fick or thermal dilution and EDP of 18.  I did shoot her distal abdominal aorta revealing widely patent iliacs arteries.  I reviewed the films with Dr. Clifton Kaisyn Millea who feels that she is a acceptable candidate for TAVR.  I performed angiography of the right common femoral artery and and successfully deployed a MYNX closure device in the right common femoral artery and vein achieving hemostasis.  The patient left lab in stable condition. Nanetta Batty. MD, [facility] 09-03-2019 6:04 PM   CT CORONARY MORPH W/CTA COR W/SCORE W/CA W/CM &/OR WO/CM  Result Date: 09/02/2019 CLINICAL DATA:  Aortic Stenosis EXAM: Cardiac TAVR CT TECHNIQUE: The patient was scanned on a Siemens Force 192 slice scanner. A 120 kV retrospective scan was triggered in the ascending thoracic aorta at 140 HU's. Gantry rotation speed was 250 msecs and collimation was .6 mm. No beta blockade or nitro were given. The 3D data set was reconstructed in 5% intervals of the R-R cycle. Systolic and diastolic phases were analyzed on a dedicated work station using MPR, MIP and VRT modes.  The patient received 80 cc of contrast. FINDINGS: Aortic Valve: Tri leaflet calcified with restricted leaflet motion Aorta: No aneurysm normal arch vessels moderate calcific atherosclerosis Sino-tubular Junction: 23 mm Ascending Thoracic Aorta: 26 mm Aortic Arch: 26 mm Descending Thoracic Aorta: 19 mm Sinus of Valsalva Measurements: Non-coronary: 29  mm Right - coronary: 28.4 mm Left -   coronary: 28.3 mm Coronary Artery Height above Annulus: Left Main: 13.1 mm above annulus Right Coronary: 14.4 mm above annulus Virtual Basal Annulus Measurements: Maximum / Minimum Diameter: 25.6 mm x 20.65 mm Perimeter: 72.4 mm Area: 388 mm2 Coronary Arteries: Sufficient height above annulus for deployment Patent SVG to OM and Patent LIMA to LAD Optimum Fluoroscopic Angle for Delivery: LAO 14 Caudal 14 degrees IMPRESSION: 1. Tri leaflet AV with annular area of 388 mm2 suitable for a 23 mm Sapien 3 valve 2. Coronary arteries suitable height above annulus for deployment Patent SVG to OM and LIMA to LAD 3.  Normal ascending aortic root diameter 2.6 cm 4. Optimum angiographic angle for deployment LAO 14 Caudal 14 degrees Charlton HawsPeter Nishan Electronically Signed   By: Charlton HawsPeter  Nishan M.D.   On: 09/02/2019 17:15   VAS US CAROTID  Result Date: 09/03/2019 Carotid Arterial Duplex Study Indications:      Pre-Op TAVR. Risk Factors:     Hypertension, hyperlipidemia, Diabetes, coronary artery                   disease. Other Factors:    Aortic valve disease, CKD III. Comparison Study: No prior study on file Performing Technologist: Sherren Kernsandace Kanady RVS  Examination Guidelines: A complete evaluation includes B-mode imaging, spectral Doppler, color Doppler, and power Doppler as needed of all accessible portions of each vessel. Bilateral testing is considered an integral part of a complete examination. Limited examinations for reoccurring indications may be performed as noted.  Right Carotid Findings: +----------+--------+--------+--------+------------------+------------------+           PSV cm/sEDV cm/sStenosisPlaque DescriptionComments           +----------+--------+--------+--------+------------------+------------------+ CCA Prox  57      7                                                    +----------+--------+--------+--------+------------------+------------------+ CCA Distal59       13                                intimal thickening +----------+--------+--------+--------+------------------+------------------+ ICA Prox  53      14              calcific                             +----------+--------+--------+--------+------------------+------------------+ ICA Distal53      12                                                   +----------+--------+--------+--------+------------------+------------------+ ECA       49      2                                                    +----------+--------+--------+--------+------------------+------------------+ +----------+--------+-------+--------+-------------------+  PSV cm/sEDV cmsDescribeArm Pressure (mmHG) +----------+--------+-------+--------+-------------------+ [id number]                                         +----------+--------+-------+--------+-------------------+ +---------+--------+--+--------+--+ VertebralPSV cm/s54EDV cm/s12 +---------+--------+--+--------+--+  Left Carotid Findings: +----------+--------+--------+--------+------------------+------------------+           PSV cm/sEDV cm/sStenosisPlaque DescriptionComments           +----------+--------+--------+--------+------------------+------------------+ CCA Prox  60      13                                intimal thickening +----------+--------+--------+--------+------------------+------------------+ CCA Distal53      6                                 intimal thickening +----------+--------+--------+--------+------------------+------------------+ ICA Prox  57      15              calcific                             +----------+--------+--------+--------+------------------+------------------+ ICA Distal67      24                                                   +----------+--------+--------+--------+------------------+------------------+ ECA       81      15                                                    +----------+--------+--------+--------+------------------+------------------+ +----------+--------+--------+--------+-------------------+           PSV cm/sEDV cm/sDescribeArm Pressure (mmHG) +----------+--------+--------+--------+-------------------+ Subclavian100                                         +----------+--------+--------+--------+-------------------+ +---------+--------+--+--------+--+ VertebralPSV cm/s66EDV cm/s13 +---------+--------+--+--------+--+   Summary: Right Carotid: Velocities in the right ICA are consistent with a 1-39% stenosis. Left Carotid: Velocities in the left ICA are consistent with a 1-39% stenosis. Vertebrals:  Bilateral vertebral arteries demonstrate antegrade flow. Subclavians: Normal flow hemodynamics were seen in bilateral subclavian              arteries. *See table(s) above for measurements and observations.     Preliminary     Cardiac Studies    Prox LAD to Mid LAD lesion is 90% stenosed.  Mid LAD-1 lesion is 90% stenosed.  Mid LAD-2 lesion is 90% stenosed.  Origin to Prox Graft lesion is 100% stenosed.  Ost Cx to Prox Cx lesion is 90% stenosed.  2nd Mrg lesion is 90% stenosed.  Mid Graft to Dist Graft lesion is 60% stenosed.  Prox Graft to Mid Graft lesion is 40% stenosed.  Hemodynamic findings consistent with aortic valve stenosis.   Diagnostic Dominance: Left  I Patient Profile     86 y.o. female with a hx of HTN, CAD (CABG X3 26 yrs ago), DM-1(on insulin pump)  and AS with echo 01/2019 with AVarea by VTI of 0.51who is being seen today for the evaluation of SOB and severe AS.  Assessment & Plan     Chronic diastolic heart failure exacerbation, severe pulmonary hypertension:  Net negative 2800 cc daily.   See below  Aortic Valve Severe Stenosis:   Dr. Cornelius Moras to see on Monday.  TAVR will depend on renal function below.   CT completed.      CAD:  Anatomy as above.   Medical management  Controlled  diabetes type 1:   Per primary team.    CKD stage III a:  Creat jumped up post cath and CT.   Hold Cozaar.  No diuresis.  Will repeat creat to verify.  Bladder scan and renal ultrasound have been ordered.  She will get gentle IV hydration.  Discussed with Dr. Sunnie Nielsen.     Essential hypertension: Continue with  metoprolol.  Hypothyroidism :  Continue Synthroid.   For questions or updates, please contact [facility] Please consult [email or web address] for contact info under Cardiology/STEMI.   Signed, Rollene Rotunda, MD  09/03/2019, 10:00 AM

## 2019-09-03 NOTE — Progress Notes (Signed)
PROGRESS NOTE    Nancy Mcguire  ZOX:[id number] DOB: [birth year] DOA: 08/27/2019 PCP: Angelica Chessman, MD   Brief Narrative: 86 year old with past medical history significant for diastolic heart failure not on diuretics, moderate to severe pulmonary hypertension, severe aortic stenosis, heart murmur, CAD, carotid artery stenosis, CKD stage III AAA, diabetes type 2 on insulin pump, hypothyroidism, chronic back pain who presented with worsening shortness of breath and dry cough. Patient was admitted with acute on chronic diastolic heart failure exacerbation, chest x-ray showed cardiomegaly and small bilateral pleural effusion, CTA negative for PE, BNP 1024.   Assessment & Plan:   Active Problems:   Hyperlipidemia   Essential hypertension   Coronary artery disease   Diabetes type 1, controlled (HCC)   Cardiac murmur   Volume overload   Severe aortic stenosis   Acute diastolic heart failure (HCC)   Aortic atherosclerosis (HCC)  1-Chronic diastolic heart failure exacerbation, severe pulmonary hypertension: Echo with ejection fraction 60 to 65%, severe aortic valve stenosis Receive IV Lasix 40 mg IV twice daily initially.  Weight 120---112---111--112 Hold oral lasix due to AKI Cardiology consulted and following.  Stable.   2-Aortic Valve Severe Stenosis: Cardiology consulted Cardiology has consulted TAVR team for further evaluation. Had Cath: LIMA and Circumflex graft open, diagonal graft with obstruction.  Underwent  CT scans, CVTS sx consult Monday, TVAR on Tuesday.    Acute on CKD stage III a; Might be related to contrast.  Hold lasix.  Cr up to 2.4 No significant urine out put overnight.  Start IV fluid. Check renal US.    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 Denies chest pain  Controlled diabetes type 1 with hyperglycemia: Continue insulin pump CBG increasing. Appreciate Diabetes educator follow up.   Essential hypertension:  Continue with losartan and metoprolol.  Hypothyroidism continue with Synthroid.  Pulmonary HTN; cardiology consulted.   Hypomagnesemia: Replete. Constipation: started  senna, lactulose. Will order dulcolax supp  Estimated body mass index is 20.56 kg/m as calculated from the following:   Height as of this encounter:  (1.575 m).   Weight as of this encounter: 51 kg.   DVT prophylaxis: Lovenox Code Status: DNR DNI Family Communication: Daughter  Disposition Plan:  Status is: Inpatient  AWaiting cardiology evaluation for aortic valvular stenosis, transition from IV to oral Lasix  Dispo: The patient is from: Home              Anticipated d/c is to: Home               Anticipated d/c date is: 2 or 3 days              Patient currently; not medical stable for discharge        Consultants:   Cardiology  Procedures:   Echo  Antimicrobials:  None  Subjective: She is not feeling well, she has not had BM yet. She has not urinated since last night.    Objective: Vitals:   09/02/19 2119 09/03/19 0400 09/03/19 0659 09/03/19 0840  BP: (!) 97/51  (!) 107/57 (!) 106/59  Pulse: 72  86 87  Resp:   19 20  Temp:    98.1 F (36.7 C)  TempSrc:    Oral  SpO2: 100%  90% 91%  Weight:  51 kg    Height:        Intake/Output Summary (Last 24 hours) at 09/03/2019 1227 Last data filed at 09/03/2019 0900 Gross per  24 hour  Intake 238 ml  Output 400 ml  Net -162 ml   Filed Weights   09-30-19 0559 09/02/19 0413 09/03/19 0400  Weight: 51.1 kg 50.5 kg 51 kg    Examination:  General exam: NAD Respiratory system: CTA Cardiovascular system: S 1, S 2 RRR, systolic murmur Gastrointestinal system: BS present, soft, nt Central nervous system: alert  Extremities: no edema   Data Reviewed: I have personally reviewed following labs and imaging studies  CBC: Recent Labs  Lab 08/30/19 0442 08/30/19 0442 08/31/19 0440 08/31/19 0440 Sep 30, 2019 0558 30-Sep-2019 0558  September 30, 2019 1657 Sep 30, 2019 1658 2019/09/30 1702 09/02/19 0426 09/03/19 0802  WBC 4.9  --  5.3  --  9.0  --   --   --   --  9.4 9.1  HGB 10.5*   < > 10.8*   < > 11.4*   < > 10.5* 10.5* 10.2* 10.7* 9.8*  HCT 33.2*   < > 34.1*   < > 36.1   < > 31.0* 31.0* 30.0* 33.1* 31.4*  MCV 94.6  --  94.2  --  94.8  --   --   --   --  95.1 94.9  PLT 200  --  222  --  251  --   --   --   --  224 191   < > = values in this interval not displayed.   Basic Metabolic Panel: Recent Labs  Lab 08/30/19 0442 08/30/19 0442 08/31/19 0440 08/31/19 0440 09-30-2019 0558 2019/09/30 0558 09/30/2019 1657 2019-09-30 1658 30-Sep-2019 1702 09/02/19 0426 09/03/19 0802 09/03/19 1058  NA 136   < > 137   < > 134*   < > 138 138 138 136 135  --   K 4.0   < > 3.2*   < > 3.9   < > 3.4* 3.4* 3.2* 3.5 4.2  --   CL 103  --  100  --  95*  --   --   --   --  101 99  --   CO2 26  --  27  --  26  --   --   --   --  21* 22  --   GLUCOSE 215*  --  100*  --  282*  --   --   --   --  257* 144*  --   BUN 16  --  20  --  23  --   --   --   --  22 40*  --   CREATININE 0.84   < > 0.97  --  1.16*  --   --   --   --  0.99 2.38* 2.69*  CALCIUM 8.5*  --  8.8*  --  8.9  --   --   --   --  8.5* 8.3*  --   MG  --   --  1.5*  --   --   --   --   --   --   --   --   --   PHOS  --   --  3.9  --   --   --   --   --   --   --   --   --    < > = values in this interval not displayed.   GFR: Estimated Creatinine Clearance: 11.7 mL/min (A) (by C-G formula based on SCr of 2.69 mg/dL (H)). Liver Function Tests: Recent Labs  Lab 08/31/19  0440  ALBUMIN 3.0*   No results for input(s): LIPASE, AMYLASE in the last 168 hours. No results for input(s): AMMONIA in the last 168 hours. Coagulation Profile: No results for input(s): INR, PROTIME in the last 168 hours. Cardiac Enzymes: No results for input(s): CKTOTAL, CKMB, CKMBINDEX, TROPONINI in the last 168 hours. BNP (last 3 results) No results for input(s): PROBNP in the last 8760 hours. HbA1C: No results for  input(s): HGBA1C in the last 72 hours. CBG: Recent Labs  Lab 09/02/19 1647 09/02/19 2111 09/03/19 0335 09/03/19 0623 09/03/19 1123  GLUCAP 212* 187* 146* 158* 115*   Lipid Profile: No results for input(s): CHOL, HDL, LDLCALC, TRIG, CHOLHDL, LDLDIRECT in the last 72 hours. Thyroid Function Tests: No results for input(s): TSH, T4TOTAL, FREET4, T3FREE, THYROIDAB in the last 72 hours. Anemia Panel: No results for input(s): VITAMINB12, FOLATE, FERRITIN, TIBC, IRON, RETICCTPCT in the last 72 hours. Sepsis Labs: No results for input(s): PROCALCITON, LATICACIDVEN in the last 168 hours.  Recent Results (from the past 240 hour(s))  SARS Coronavirus 2 by RT PCR (hospital order, performed in [facility] hospital lab) Nasopharyngeal Nasopharyngeal Swab     Status: None   Collection Time: 09/08/2019  7:56 PM   Specimen: Nasopharyngeal Swab  Result Value Ref Range Status   SARS Coronavirus 2 NEGATIVE NEGATIVE Final    Comment: (NOTE) SARS-CoV-2 target nucleic acids are NOT DETECTED. The SARS-CoV-2 RNA is generally detectable in upper and lower respiratory specimens during the acute phase of infection. The lowest concentration of SARS-CoV-2 viral copies this assay can detect is 250 copies / mL. A negative result does not preclude SARS-CoV-2 infection and should not be used as the sole basis for treatment or other patient management decisions.  A negative result may occur with improper specimen collection / handling, submission of specimen other than nasopharyngeal swab, presence of viral mutation(s) within the areas targeted by this assay, and inadequate number of viral copies (<250 copies / mL). A negative result must be combined with clinical observations, patient history, and epidemiological information. Fact Sheet for Patients:   StrictlyIdeas.no Fact Sheet for Healthcare Providers: BankingDealers.co.za This test is not yet approved or  cleared  by the [place] FDA and has been authorized for detection and/or diagnosis of SARS-CoV-2 by FDA under an Emergency Use Authorization (EUA).  This EUA will remain in effect (meaning this test can be used) for the duration of the COVID-19 declaration under Section 564(b)(1) of the Act, 21 U.S.C. section 360bbb-3(b)(1), unless the authorization is terminated or revoked sooner. Performed at [facility], [place] [address] [place], [place] [address]          Radiology Studies: CARDIAC CATHETERIZATION  Result Date: Sep 10, 2019  Prox LAD to Mid LAD lesion is 90% stenosed.  Mid LAD-1 lesion is 90% stenosed.  Mid LAD-2 lesion is 90% stenosed.  Origin to Prox Graft lesion is 100% stenosed.  Ost Cx to Prox Cx lesion is 90% stenosed.  2nd Mrg lesion is 90% stenosed.  Mid Graft to Dist Graft lesion is 60% stenosed.  Prox Graft to Mid Graft lesion is 40% stenosed.  Hemodynamic findings consistent with aortic valve stenosis.  Pasha Gadison is a 86 y.o. female  [id number] LOCATION:  FACILITY: [facility] PHYSICIAN: Quay Burow, M.D. April 06, 1930 DATE OF PROCEDURE:  Sep 10, 2019 DATE OF DISCHARGE: CARDIAC CATHETERIZATION History obtained from chart review.86 y.o. female with a hx of HTN, CAD (CABG X3 26 yrs ago), DM-1(on insulin pump) and AS with echo 01/2019 with  AVarea by VTI of 0.51who is being seen today for the evaluation of SOB and severe AS.   Ms. Tuccillo has 2 of 3 grafts patent in the left dominant system.  The vein graft to the diagonal branch is occluded at the origin.  The LIMA to the LAD is patent with significant proximal LAD disease proximal to LIMA insertion.  The vein graft to the circumflex has moderate disease in the mid and distal shaft.  The RCA is nondominant.  She has severe aortic stenosis with a valve area between 0.6 1.8 by Fick or thermal dilution and EDP of 18.  I did shoot her distal abdominal aorta revealing widely patent iliacs arteries.  I reviewed the films with  Dr. Clifton James who feels that she is a acceptable candidate for TAVR.  I performed angiography of the right common femoral artery and and successfully deployed a MYNX closure device in the right common femoral artery and vein achieving hemostasis.  The patient left lab in stable condition. Nanetta Batty. MD, [facility] 2019-09-21 6:04 PM   US RENAL  Result Date: 09/03/2019 CLINICAL DATA:  86 year female with acute kidney injury EXAM: RENAL / URINARY TRACT ULTRASOUND COMPLETE COMPARISON:  None. FINDINGS: Right Kidney: Length: 11.3 cm x 4.2 cm x 4.7 cm, 97 cc. Echogenicity within normal limits. No mass or hydronephrosis visualized. Left Kidney: Length: 9.0 cm x 5.0 cm x 4.3 cm 102 cc. Echogenicity within normal limits. No mass or hydronephrosis visualized. Bladder: Appears normal for degree of bladder distention. IMPRESSION: Negative for hydronephrosis of the left or right kidney. Electronically Signed   By: Gilmer Mor D.O.   On: 09/03/2019 10:49   CT CORONARY MORPH W/CTA COR W/SCORE W/CA W/CM &/OR WO/CM  Result Date: 09/02/2019 CLINICAL DATA:  Aortic Stenosis EXAM: Cardiac TAVR CT TECHNIQUE: The patient was scanned on a Siemens Force 192 slice scanner. A 120 kV retrospective scan was triggered in the ascending thoracic aorta at 140 HU's. Gantry rotation speed was 250 msecs and collimation was .6 mm. No beta blockade or nitro were given. The 3D data set was reconstructed in 5% intervals of the R-R cycle. Systolic and diastolic phases were analyzed on a dedicated work station using MPR, MIP and VRT modes. The patient received 80 cc of contrast. FINDINGS: Aortic Valve: Tri leaflet calcified with restricted leaflet motion Aorta: No aneurysm normal arch vessels moderate calcific atherosclerosis Sino-tubular Junction: 23 mm Ascending Thoracic Aorta: 26 mm Aortic Arch: 26 mm Descending Thoracic Aorta: 19 mm Sinus of Valsalva Measurements: Non-coronary: 29 mm Right - coronary: 28.4 mm Left -   coronary: 28.3 mm  Coronary Artery Height above Annulus: Left Main: 13.1 mm above annulus Right Coronary: 14.4 mm above annulus Virtual Basal Annulus Measurements: Maximum / Minimum Diameter: 25.6 mm x 20.65 mm Perimeter: 72.4 mm Area: 388 mm2 Coronary Arteries: Sufficient height above annulus for deployment Patent SVG to OM and Patent LIMA to LAD Optimum Fluoroscopic Angle for Delivery: LAO 14 Caudal 14 degrees IMPRESSION: 1. Tri leaflet AV with annular area of 388 mm2 suitable for a 23 mm Sapien 3 valve 2. Coronary arteries suitable height above annulus for deployment Patent SVG to OM and LIMA to LAD 3.  Normal ascending aortic root diameter 2.6 cm 4. Optimum angiographic angle for deployment LAO 14 Caudal 14 degrees Charlton Haws Electronically Signed   By: Charlton Haws M.D.   On: 09/02/2019 17:15   VAS US CAROTID  Result Date: 09/03/2019 Carotid Arterial Duplex Study Indications:  Pre-Op TAVR. Risk Factors:     Hypertension, hyperlipidemia, Diabetes, coronary artery                   disease. Other Factors:    Aortic valve disease, CKD III. Comparison Study: No prior study on file Performing Technologist: Sherren Kernsandace Kanady RVS  Examination Guidelines: A complete evaluation includes B-mode imaging, spectral Doppler, color Doppler, and power Doppler as needed of all accessible portions of each vessel. Bilateral testing is considered an integral part of a complete examination. Limited examinations for reoccurring indications may be performed as noted.  Right Carotid Findings: +----------+--------+--------+--------+------------------+------------------+             PSV cm/s EDV cm/s Stenosis Plaque Description Comments            +----------+--------+--------+--------+------------------+------------------+  CCA Prox   57       7                                                        +----------+--------+--------+--------+------------------+------------------+  CCA Distal 59       13                                   intimal  thickening  +----------+--------+--------+--------+------------------+------------------+  ICA Prox   53       14                calcific                               +----------+--------+--------+--------+------------------+------------------+  ICA Distal 53       12                                                       +----------+--------+--------+--------+------------------+------------------+  ECA        49       2                                                        +----------+--------+--------+--------+------------------+------------------+ +----------+--------+-------+--------+-------------------+             PSV cm/s EDV cms Describe Arm Pressure (mmHG)  +----------+--------+-------+--------+-------------------+  Subclavian 90                                             +----------+--------+-------+--------+-------------------+ +---------+--------+--+--------+--+  Vertebral PSV cm/s 54 EDV cm/s 12  +---------+--------+--+--------+--+  Left Carotid Findings: +----------+--------+--------+--------+------------------+------------------+             PSV cm/s EDV cm/s Stenosis Plaque Description Comments            +----------+--------+--------+--------+------------------+------------------+  CCA Prox   60       13  intimal thickening  +----------+--------+--------+--------+------------------+------------------+  CCA Distal 53       6                                    intimal thickening  +----------+--------+--------+--------+------------------+------------------+  ICA Prox   57       15                calcific                               +----------+--------+--------+--------+------------------+------------------+  ICA Distal 67       24                                                       +----------+--------+--------+--------+------------------+------------------+  ECA        81       15                                                        +----------+--------+--------+--------+------------------+------------------+ +----------+--------+--------+--------+-------------------+             PSV cm/s EDV cm/s Describe Arm Pressure (mmHG)  +----------+--------+--------+--------+-------------------+  Subclavian 100                                             +----------+--------+--------+--------+-------------------+ +---------+--------+--+--------+--+  Vertebral PSV cm/s 66 EDV cm/s 13  +---------+--------+--+--------+--+   Summary: Right Carotid: Velocities in the right ICA are consistent with a 1-39% stenosis. Left Carotid: Velocities in the left ICA are consistent with a 1-39% stenosis. Vertebrals:  Bilateral vertebral arteries demonstrate antegrade flow. Subclavians: Normal flow hemodynamics were seen in bilateral subclavian              arteries. *See table(s) above for measurements and observations.  Electronically signed by Lemar Livings MD on 09/03/2019 at 10:42:53 AM.    Final         Scheduled Meds:  aspirin  81 mg Oral Daily   insulin pump   Subcutaneous TID WC, HS, 0200   lactulose  20 g Oral Daily   levothyroxine  50 mcg Oral QAC breakfast   metoprolol tartrate  50 mg Oral BID   NIFEdipine  60 mg Oral Daily   senna-docusate  1 tablet Oral BID   simvastatin  20 mg Oral q1800   sodium chloride flush  3 mL Intravenous Q12H   Continuous Infusions:  sodium chloride     sodium chloride 75 mL/hr at 09/03/19 1224     LOS: 5 days    Time spent: 35 minutes    Surya Schroeter A Tamica Covell, MD [facility]   If 7PM-7AM, please contact night-coverage [email or web address]  09/03/2019, 12:27 PM

## 2019-09-03 NOTE — Progress Notes (Signed)
VASCULAR LAB PRELIMINARY  PRELIMINARY  PRELIMINARY  PRELIMINARY  Carotid duplex completed.    Preliminary report:  See CV proc for preliminary results.   Wilborn Membreno, RVT 09/03/2019, 9:58 AM

## 2019-09-04 DIAGNOSIS — I5031 Acute diastolic (congestive) heart failure: Secondary | ICD-10-CM

## 2019-09-04 LAB — BASIC METABOLIC PANEL
Anion gap: 10 (ref 5–15)
BUN: 50 mg/dL — ABNORMAL HIGH (ref 8–23)
CO2: 22 mmol/L (ref 22–32)
Calcium: 7.8 mg/dL — ABNORMAL LOW (ref 8.9–10.3)
Chloride: 101 mmol/L (ref 98–111)
Creatinine, Ser: 3.88 mg/dL — ABNORMAL HIGH (ref 0.44–1.00)
GFR calc Af Amer: 11 mL/min — ABNORMAL LOW (ref >60)
GFR calc non Af Amer: 10 mL/min — ABNORMAL LOW (ref >60)
Glucose, Bld: 191 mg/dL — ABNORMAL HIGH (ref 70–99)
Potassium: 3.7 mmol/L (ref 3.5–5.1)
Sodium: 133 mmol/L — ABNORMAL LOW (ref 135–145)

## 2019-09-04 LAB — URINALYSIS, ROUTINE W REFLEX MICROSCOPIC
Bilirubin Urine: NEGATIVE
Glucose, UA: NEGATIVE mg/dL
Ketones, ur: NEGATIVE mg/dL
Nitrite: NEGATIVE
Protein, ur: 30 mg/dL — AB
Specific Gravity, Urine: 1.027 (ref 1.005–1.030)
WBC, UA: >50 WBC/hpf — ABNORMAL HIGH (ref 0–5)
pH: 5 (ref 5.0–8.0)

## 2019-09-04 LAB — GLUCOSE, CAPILLARY
Glucose-Capillary: 150 mg/dL — ABNORMAL HIGH (ref 70–99)
Glucose-Capillary: 182 mg/dL — ABNORMAL HIGH (ref 70–99)
Glucose-Capillary: 269 mg/dL — ABNORMAL HIGH (ref 70–99)
Glucose-Capillary: 202 mg/dL — ABNORMAL HIGH (ref 70–99)
Glucose-Capillary: 162 mg/dL — ABNORMAL HIGH (ref 70–99)

## 2019-09-04 LAB — CBC
HCT: 30.1 % — ABNORMAL LOW (ref 36.0–46.0)
Hemoglobin: 9.5 g/dL — ABNORMAL LOW (ref 12.0–15.0)
MCH: 29.7 pg (ref 26.0–34.0)
MCHC: 31.6 g/dL (ref 30.0–36.0)
MCV: 94.1 fL (ref 80.0–100.0)
Platelets: 195 10*3/uL (ref 150–400)
RBC: 3.2 MIL/uL — ABNORMAL LOW (ref 3.87–5.11)
RDW: 15.6 % — ABNORMAL HIGH (ref 11.5–15.5)
WBC: 8.1 10*3/uL (ref 4.0–10.5)
nRBC: 0 % (ref 0.0–0.2)

## 2019-09-04 LAB — CREATININE, URINE, RANDOM: Creatinine, Urine: 127.9 mg/dL

## 2019-09-04 LAB — SODIUM, URINE, RANDOM: Sodium, Ur: <10 mmol/L

## 2019-09-04 MED ORDER — NIFEDIPINE ER OSMOTIC RELEASE 30 MG PO TB24
30.0000 mg | ORAL_TABLET | Freq: Two times a day (BID) | ORAL | Status: DC
  Administered 2019-09-04 – 2019-09-07 (×6): 30 mg via ORAL
  Filled 2019-09-04 (×9): qty 1

## 2019-09-04 NOTE — Consult Note (Signed)
Renal Service Consult Note [facility]  Nancy Mcguire 09/04/2019 Nancy Mcguire Requesting Physician:  Dr Sunnie Nielsen  Reason for Consult: Renal failure HPI: The patient is a 86 y.o. year-old w/ hx of severe aortic stenosis, pulm HTN, diast HF, CKD 3, CAD sp CABG 1993, DM1 on insulin pump, HTN presented ton 5/17 w/ chest pain, SOB/ DOE. In ED pt had CTA chest w/o PE but showed bilat pleural effusion. Pt was started on IV lasix then creat went up and she was given IVF's.  She then became SOB so IVF"s were stopped, but creat continued to rise up to 3.88 this am.  Asked to see for renal failure.   Creat was 0.84 on admit 5/17, stable until 5.22 up to 2.69 yest and 3.88 today. BUN 50 today.    UOP 2.9 L on 5/18, then 500- 1000 cc/day. Nancy Mcguire was 500 cc out.  Total I/O = neg 700 cc .   Admit wt 54kg, nadir was 50.5kg on 5/21 and now back up to 52.5kg.  CXR 5/17 - bilat small pleural effusions vs scarring, CM CTA 5/17 - .80 cc contrast > bilat effusions, no PE CT cor morph 5/21 - 80 cc contrast > done for TAVR prep  Pt seen in room. Hadn't peed for 36 hrs but did pee today twice about 1/2 cup each time. No sob or CP at this time.  No N/v , no confusion or hallucination.   ROS  denies CP  no joint pain   no HA  no blurry vision  no rash  no diarrhea  no nausea/ vomiting  no dysuria  no difficulty voiding  no change in urine color    Past Medical History  Past Medical History:  Diagnosis Date  . CAD (coronary artery disease)   . Carotid stenosis   . Chronic back pain   . CKD (chronic kidney disease) stage 3, GFR 30-59 ml/min   . Diabetes mellitus type 1 (HCC)   . Hyperlipidemia   . Hypertension   . Hypothyroidism   . Osteoporosis    Past Surgical History  Past Surgical History:  Procedure Laterality Date  . CESAREAN SECTION    . CORONARY ARTERY BYPASS GRAFT     X 3  . RIGHT/LEFT HEART CATH AND CORONARY/GRAFT ANGIOGRAPHY N/A 09/08/2019   Procedure: RIGHT/LEFT  HEART CATH AND CORONARY/GRAFT ANGIOGRAPHY;  Surgeon: Runell Gess, MD;  Location: [facility] INVASIVE CV LAB;  Service: Cardiovascular;  Laterality: N/A;  . TRIGGER FINGER RELEASE     Family History  Family History  Problem Relation Age of Onset  . Healthy Mother        passed at 53, unsure of any health problems   Social History  reports that she has never smoked. She has never used smokeless tobacco. She reports current alcohol use. She reports that she does not use drugs. Allergies  Allergies  Allergen Reactions  . Neomycin-Bacitracin Zn-Polymyx Other (See Comments)    Eyes irritation   . Hydrocodone-Acetaminophen Nausea And Vomiting   Home medications Prior to Admission medications   Medication Sig Start Date End Date Taking? Authorizing Provider  acetaminophen (TYLENOL) 500 MG tablet Take 1,000 mg by mouth in the morning, at noon, and at bedtime.    Yes [provider]  calcium carbonate (CALCIUM 600) 600 MG TABS tablet Take 600 mg by mouth daily with breakfast.   Yes [provider]  Cholecalciferol (VITAMIN D3 PO) Take 50 mcg by mouth daily.  Yes [provider]  cyanocobalamin (,VITAMIN B-12,) 1000 MCG/ML injection Inject 1,000 mcg into the muscle every 30 (thirty) days.  09/30/18  Yes [provider]  denosumab (PROLIA) 60 MG/ML SOSY injection Inject 60 mg into the skin every 6 (six) months.   Yes [provider]  folic acid (FOLVITE) 948 MCG tablet Take 400 mcg by mouth daily.   Yes [provider]  gabapentin (NEURONTIN) 100 MG capsule Take 100 mg by mouth 2 (two) times daily.   Yes [provider]  insulin lispro (HUMALOG) 100 UNIT/ML injection Insulin pump 05/19/18  Yes [provider]  levothyroxine (SYNTHROID, LEVOTHROID) 50 MCG tablet Take 50 mcg by mouth daily before breakfast.   Yes [provider]  losartan (COZAAR) 100 MG tablet Take 100 mg by mouth daily.   Yes [provider]   metoprolol tartrate (LOPRESSOR) 50 MG tablet Take 50 mg by mouth 2 (two) times daily.   Yes [provider]  NIFEdipine (PROCARDIA-XL/ADALAT CC) 60 MG 24 hr tablet Take 60 mg by mouth daily.   Yes [provider]  Polyethyl Glyc-Propyl Glyc PF (SYSTANE HYDRATION PF) 0.4-0.3 % SOLN Apply 1-2 drops to eye 4 (four) times daily as needed (dry eyes).   Yes [provider]  simvastatin (ZOCOR) 20 MG tablet Take 20 mg by mouth daily.   Yes [provider]  vitamin E 400 UNIT capsule Take 400 Units by mouth daily.   Yes [provider]     Vitals:   09/04/19 5462 09/04/19 0356 09/04/19 1114 09/04/19 1416  BP: (!) 96/49 (!) 108/51 (!) 122/58 (!) 105/53  Pulse: 67  75 70  Resp: 20  16 20   Temp: 98 F (36.7 C)  (!) 97.4 F (36.3 C) 97.8 F (36.6 C)  TempSrc: Oral  Oral Oral  SpO2: 92% 98% 100% 90%  Weight: 52.5 kg     Height:       Exam Gen alert, eldelry WF, no distress, no O2 up in chair No rash, cyanosis or gangrene Sclera anicteric, throat clear  No jvd Chest clear bilat to bases no rales or wheezing RRR no RG, loud SEM 3/6 rusb Abd soft ntnd no mass or ascites +bs GU defer MS no joint effusions or deformity Ext no LE or UE edema, no wounds or ulcers Neuro is alert, Ox 3 , nf    Home meds:  - losartan 100/ zocor 20/ procardia xl 60 qd/ metoprolol 50 bid  - synthroid 50ug qam/ gabapentin 100 bid/ denosumab 60mg  q31mo  - insulin lispro, insulin pump  - prn's/ vitamins/ supplements  UOP 2.9 L on 5/18, then 500- 1000 cc/day. Nancy Mcguire was 500 cc out.  Total I/O = neg 700 cc .  Admit wt 54kg, nadir was 50.5kg on 5/21 and now back up to 52.5kg.   CXR 5/17 - bilat small pleural effusions vs scarring, CM CTA 5/17 - .80 cc contrast > bilat effusions, no PE CT cor morph 5/21 - 80 cc contrast > done for TAVR prep  UA not done  RUS 5/22 - 11.3/ 9.0 cm , no mass or hydro, normal echotexture  Assessment/ Plan: 1. Renal failure - AKI at this  time, most likely contrast nephropathy (sp CT x 2) w/ ARB effect also possible . Creat 0.8 at baseline on admission, up to 2.3 yest and 3.8 today. No signs of uremia.   Per pt was anuric but now is making urine, may be sign of early recovery.  Volume status is good at this time, clear lungs, on room air, no edema. Down slightly from admit wt.  Agree w/ current supportive care management. Would not give IVF's or lasix at this time. Await recovery. Will follow.  2. Severe AS - w/u in progress for TAVR 3. HTN - losartan dc'd yesterday. BP's are soft, will dc metoprolol and change procardia xl to 30 bid for now w/ hold orders. Let BP's come up some w/ AKI.  4.  CAD h/o CABG remote 5.  Chron diast CHF: stable at this time    Vinson Moselle  MD 09/04/2019, 7:33 PM  Recent Labs  Lab 09/03/19 0802 09/04/19 0728  WBC 9.1 8.1  HGB 9.8* 9.5*   Recent Labs  Lab 08/31/19 0440 09/23/2019 0558 09/03/19 0802 09/03/19 0802 09/03/19 1058 09/04/19 0728  K 3.2*   < > 4.2  --   --  3.7  BUN 20   < > 40*  --   --  50*  CREATININE 0.97   < > 2.38*   < > 2.69* 3.88*  CALCIUM 8.8*   < > 8.3*  --   --  7.8*  PHOS 3.9  --   --   --   --   --    < > = values in this interval not displayed.

## 2019-09-04 NOTE — Progress Notes (Signed)
Progress Note  Patient Name: Nancy Mcguire Date of Encounter: 09/04/2019  Primary Cardiologist:   Nanetta Batty, MD   Subjective   No SOB.  No pain  Inpatient Medications    Scheduled Meds:  aspirin  81 mg Oral Daily   heparin injection (subcutaneous)  5,000 Units Subcutaneous Q8H   insulin pump   Subcutaneous TID WC, HS, 0200   lactulose  20 g Oral Daily   levothyroxine  50 mcg Oral QAC breakfast   metoprolol tartrate  50 mg Oral BID   NIFEdipine  60 mg Oral Daily   senna-docusate  1 tablet Oral BID   simvastatin  20 mg Oral q1800   sodium chloride flush  3 mL Intravenous Q12H   Continuous Infusions:  sodium chloride     sodium chloride 75 mL/hr at 09/04/19 0042   PRN Meds: sodium chloride, acetaminophen, ondansetron (ZOFRAN) IV, sodium chloride flush   Vital Signs    Vitals:   09/03/19 0840 09/03/19 1935 09/04/19 0333 09/04/19 0356  BP: (!) 106/59 108/61 (!) 96/49 (!) 108/51  Pulse: 87 70 67   Resp: 20 17 20    Temp: 98.1 F (36.7 C) (!) 97.5 F (36.4 C) 98 F (36.7 C)   TempSrc: Oral Oral Oral   SpO2: 91% 91% 92% 98%  Weight:   52.5 kg   Height:        Intake/Output Summary (Last 24 hours) at 09/04/2019 0819 Last data filed at 09/04/2019 09/06/2019 Gross per 24 hour  Intake 1747.91 ml  Output 100 ml  Net 1647.91 ml   Filed Weights   09/02/19 0413 09/03/19 0400 09/04/19 0333  Weight: 50.5 kg 51 kg 52.5 kg    Telemetry    NSR - Personally Reviewed  ECG    NA - Personally Reviewed  Physical Exam   GEN: No  acute distress.   Neck: No  JVD Cardiac: RRR, no murmurs, rubs, or gallops.  Respiratory:    Decreased breath sounds with few basilar crackles.  GI: Soft, nontender, non-distended, normal bowel sounds  MS:  No  edema; No deformity. Neuro:   Nonfocal  Psych: Oriented and appropriate    Labs    Chemistry Recent Labs  Lab 08/31/19 0440 08/31/19 0440 09/08/2019 0558 08/21/2019 0558 08/17/2019 1657 08/20/2019 1702 09/02/19 0426  09/03/19 0802 09/03/19 1058  NA 137   < > 134*  --    < > 138 136 135  --   K 3.2*   < > 3.9  --    < > 3.2* 3.5 4.2  --   CL 100   < > 95*  --   --   --  101 99  --   CO2 27   < > 26  --   --   --  21* 22  --   GLUCOSE 100*   < > 282*  --   --   --  257* 144*  --   BUN 20   < > 23  --   --   --  22 40*  --   CREATININE 0.97   < > 1.16*   < >  --   --  0.99 2.38* 2.69*  CALCIUM 8.8*   < > 8.9  --   --   --  8.5* 8.3*  --   ALBUMIN 3.0*  --   --   --   --   --   --   --   --  GFRNONAA 53*   < > 42*   < >  --   --  51* 18* 15*  GFRAA >60   < > 49*   < >  --   --  59* 21* 18*  ANIONGAP 10   < > 13  --   --   --  14 14  --    < > = values in this interval not displayed.     Hematology Recent Labs  Lab 09-15-19 0558 2019-09-15 1657 2019/09/15 1702 09/02/19 0426 09/03/19 0802  WBC 9.0  --   --  9.4 9.1  RBC 3.81*  --   --  3.48* 3.31*  HGB 11.4*   < > 10.2* 10.7* 9.8*  HCT 36.1   < > 30.0* 33.1* 31.4*  MCV 94.8  --   --  95.1 94.9  MCH 29.9  --   --  30.7 29.6  MCHC 31.6  --   --  32.3 31.2  RDW 16.2*  --   --  16.1* 15.9*  PLT 251  --   --  224 191   < > = values in this interval not displayed.    Cardiac EnzymesNo results for input(s): TROPONINI in the last 168 hours. No results for input(s): TROPIPOC in the last 168 hours.   BNP Recent Labs  Lab 08/16/2019 1539  BNP 1,024.0*     DDimer No results for input(s): DDIMER in the last 168 hours.   Radiology    US RENAL  Result Date: 09/03/2019 CLINICAL DATA:  86 year female with acute kidney injury EXAM: RENAL / URINARY TRACT ULTRASOUND COMPLETE COMPARISON:  None. FINDINGS: Right Kidney: Length: 11.3 cm x 4.2 cm x 4.7 cm, 97 cc. Echogenicity within normal limits. No mass or hydronephrosis visualized. Left Kidney: Length: 9.0 cm x 5.0 cm x 4.3 cm 102 cc. Echogenicity within normal limits. No mass or hydronephrosis visualized. Bladder: Appears normal for degree of bladder distention. IMPRESSION: Negative for  hydronephrosis of the left or right kidney. Electronically Signed   By: Gilmer Mor D.O.   On: 09/03/2019 10:49   CT CORONARY MORPH W/CTA COR W/SCORE W/CA W/CM &/OR WO/CM  Result Date: 09/02/2019 CLINICAL DATA:  Aortic Stenosis EXAM: Cardiac TAVR CT TECHNIQUE: The patient was scanned on a Siemens Force 192 slice scanner. A 120 kV retrospective scan was triggered in the ascending thoracic aorta at 140 HU's. Gantry rotation speed was 250 msecs and collimation was .6 mm. No beta blockade or nitro were given. The 3D data set was reconstructed in 5% intervals of the R-R cycle. Systolic and diastolic phases were analyzed on a dedicated work station using MPR, MIP and VRT modes. The patient received 80 cc of contrast. FINDINGS: Aortic Valve: Tri leaflet calcified with restricted leaflet motion Aorta: No aneurysm normal arch vessels moderate calcific atherosclerosis Sino-tubular Junction: 23 mm Ascending Thoracic Aorta: 26 mm Aortic Arch: 26 mm Descending Thoracic Aorta: 19 mm Sinus of Valsalva Measurements: Non-coronary: 29 mm Right - coronary: 28.4 mm Left -   coronary: 28.3 mm Coronary Artery Height above Annulus: Left Main: 13.1 mm above annulus Right Coronary: 14.4 mm above annulus Virtual Basal Annulus Measurements: Maximum / Minimum Diameter: 25.6 mm x 20.65 mm Perimeter: 72.4 mm Area: 388 mm2 Coronary Arteries: Sufficient height above annulus for deployment Patent SVG to OM and Patent LIMA to LAD Optimum Fluoroscopic Angle for Delivery: LAO 14 Caudal 14 degrees IMPRESSION: 1. Tri leaflet AV with annular area of 388 mm2 suitable  for a 23 mm Sapien 3 valve 2. Coronary arteries suitable height above annulus for deployment Patent SVG to OM and LIMA to LAD 3.  Normal ascending aortic root diameter 2.6 cm 4. Optimum angiographic angle for deployment LAO 14 Caudal 14 degrees [place] Electronically Signed   By: Jenkins Rouge M.D.   On: 09/02/2019 17:15   VAS US CAROTID  Result Date: 09/03/2019 Carotid  Arterial Duplex Study Indications:      Pre-Op TAVR. Risk Factors:     Hypertension, hyperlipidemia, Diabetes, coronary artery                   disease. Other Factors:    Aortic valve disease, CKD III. Comparison Study: No prior study on file Performing Technologist: Sharion Dove RVS  Examination Guidelines: A complete evaluation includes B-mode imaging, spectral Doppler, color Doppler, and power Doppler as needed of all accessible portions of each vessel. Bilateral testing is considered an integral part of a complete examination. Limited examinations for reoccurring indications may be performed as noted.  Right Carotid Findings: +----------+--------+--------+--------+------------------+------------------+             PSV cm/s EDV cm/s Stenosis Plaque Description Comments            +----------+--------+--------+--------+------------------+------------------+  CCA Prox   57       7                                                        +----------+--------+--------+--------+------------------+------------------+  CCA Distal 59       13                                   intimal thickening  +----------+--------+--------+--------+------------------+------------------+  ICA Prox   53       14                calcific                               +----------+--------+--------+--------+------------------+------------------+  ICA Distal 53       12                                                       +----------+--------+--------+--------+------------------+------------------+  ECA        49       2                                                        +----------+--------+--------+--------+------------------+------------------+ +----------+--------+-------+--------+-------------------+             PSV cm/s EDV cms Describe Arm Pressure (mmHG)  +----------+--------+-------+--------+-------------------+  Subclavian 90                                             +----------+--------+-------+--------+-------------------+  +---------+--------+--+--------+--+  Vertebral PSV cm/s 54 EDV cm/s 12  +---------+--------+--+--------+--+  Left Carotid Findings: +----------+--------+--------+--------+------------------+------------------+             PSV cm/s EDV cm/s Stenosis Plaque Description Comments            +----------+--------+--------+--------+------------------+------------------+  CCA Prox   60       13                                   intimal thickening  +----------+--------+--------+--------+------------------+------------------+  CCA Distal 53       6                                    intimal thickening  +----------+--------+--------+--------+------------------+------------------+  ICA Prox   57       15                calcific                               +----------+--------+--------+--------+------------------+------------------+  ICA Distal 67       24                                                       +----------+--------+--------+--------+------------------+------------------+  ECA        81       15                                                       +----------+--------+--------+--------+------------------+------------------+ +----------+--------+--------+--------+-------------------+             PSV cm/s EDV cm/s Describe Arm Pressure (mmHG)  +----------+--------+--------+--------+-------------------+  Subclavian 100                                             +----------+--------+--------+--------+-------------------+ +---------+--------+--+--------+--+  Vertebral PSV cm/s 66 EDV cm/s 13  +---------+--------+--+--------+--+   Summary: Right Carotid: Velocities in the right ICA are consistent with a 1-39% stenosis. Left Carotid: Velocities in the left ICA are consistent with a 1-39% stenosis. Vertebrals:  Bilateral vertebral arteries demonstrate antegrade flow. Subclavians: Normal flow hemodynamics were seen in bilateral subclavian              arteries. *See table(s) above for measurements and observations.   Electronically signed by Lemar Livings MD on 09/03/2019 at 10:42:53 AM.    Final     Cardiac Studies    Prox LAD to Mid LAD lesion is 90% stenosed.  Mid LAD-1 lesion is 90% stenosed.  Mid LAD-2 lesion is 90% stenosed.  Origin to Prox Graft lesion is 100% stenosed.  Ost Cx to Prox Cx lesion is 90% stenosed.  2nd Mrg lesion is 90% stenosed.  Mid Graft to Dist Graft lesion is 60% stenosed.  Prox Graft to Mid Graft lesion is 40% stenosed.  Hemodynamic findings consistent with aortic valve stenosis.   Diagnostic  Dominance: Left  I Patient Profile     86 y.o. female with a hx of HTN, CAD (CABG X3 26 yrs ago), DM-1(on insulin pump) and AS with echo 01/2019 with AVarea by VTI of 0.51who is being seen today for the evaluation of SOB and severe AS.  Assessment & Plan     Chronic diastolic heart failure exacerbation, severe pulmonary hypertension:  Hydrated yesterday secondary to AKI.   1.2 liters positive yesterday but net negative since admission.  Holding on further hydration with basilar crackles  Aortic Valve Severe Stenosis:   Dr. Cornelius Moraswen to see on Monday.        CAD:  Anatomy as above.   Medical management.   Controlled diabetes type 1:   Per primary team.   Glucose control in hospital is marginal  CKD stage III a:  Creat jumped up post cath and CT.  Repeated and verified.   Held Cozaar and diuresis.  Renal ultrasound without significant abnormality.  She was hydrated.  .  Need to follow creat. Renal has been consulted.   Essential hypertension:  Continue beta blocker.  BP is OK.   Hypothyroidism :  Continue Synthroid.   For questions or updates, please contact [facility] Please consult [email or web address] for contact info under Cardiology/STEMI.   Signed, Rollene RotundaJames Marlee Trentman, MD  09/04/2019, 8:19 AM

## 2019-09-04 NOTE — Progress Notes (Signed)
PROGRESS NOTE    Nancy Mcguire  DXI:[id number] DOB: 02/24/1930 DOA: 08/28/2019 PCP: Angelica Chessman, MD   Brief Narrative: 86 year old with past medical history significant for diastolic heart failure not on diuretics, moderate to severe pulmonary hypertension, severe aortic stenosis, heart murmur, CAD, carotid artery stenosis, CKD stage III AAA, diabetes type 2 on insulin pump, hypothyroidism, chronic back pain who presented with worsening shortness of breath and dry cough. Patient was admitted with acute on chronic diastolic heart failure exacerbation, chest x-ray showed cardiomegaly and small bilateral pleural effusion, CTA negative for PE, BNP 1024.   Assessment & Plan:   Active Problems:   Hyperlipidemia   Essential hypertension   Coronary artery disease   Diabetes type 1, controlled (HCC)   Cardiac murmur   Volume overload   Severe aortic stenosis   Acute diastolic heart failure (HCC)   Aortic atherosclerosis (HCC)  1-Chronic diastolic heart failure exacerbation, severe pulmonary hypertension: Echo with ejection fraction 60 to 65%, severe aortic valve stenosis Receive IV Lasix 40 mg IV twice daily initially.  Weight 120---112---111--112 Hold oral lasix due to AKI Cardiology consulted and following.  Stable.  Crackles and back on 2 L oxygen. Hold IV fluids.   2-Aortic Valve Severe Stenosis: Cardiology consulted Cardiology has consulted TAVR team for further evaluation. Had Cath: LIMA and Circumflex graft open, diagonal graft with obstruction.  Underwent  CT scans, CVTS sx consult Monday, TVAR on Tuesday.    Acute on CKD stage III a; related to contrast.  Continue to Hold lasix.  Cr up to 2.4--3.6 Oliguric.  Renal US; hydronephrosis Received IV fluids.  We will hold further IV hydration due to crackles and mild hypoxemia. 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 Denies chest pain  Controlled diabetes  type 1 with hyperglycemia: Continue insulin pump CBG increasing. Appreciate Diabetes educator follow up.   Essential hypertension: Continue with losartan and metoprolol.  Hypothyroidism continue with Synthroid.  Pulmonary HTN; cardiology consulted.   Hypomagnesemia: Replete. Constipation: started  senna, lactulose. Will order dulcolax supp  Estimated body mass index is 21.16 kg/m as calculated from the following:   Height as of this encounter: 5\' 2"  (1.575 m).   Weight as of this encounter: 52.5 kg.   DVT prophylaxis: Lovenox Code Status: DNR DNI Family Communication: Daughter  Disposition Plan:  Status is: Inpatient  AWaiting cardiology evaluation for aortic valvular stenosis, transition from IV to oral Lasix  Dispo: The patient is from: Home              Anticipated d/c is to: Home               Anticipated d/c date is: 2 or 3 days              Patient currently; not medical stable for discharge        Consultants:   Cardiology  Procedures:   Echo  Antimicrobials:  None  Subjective: He was able to have a good bowel movement last night.  She denies dyspnea.  Objective: Vitals:   09/03/19 1935 09/04/19 0333 09/04/19 0356 09/04/19 1114  BP: 108/61 (!) 96/49 (!) 108/51 (!) 122/58  Pulse: 70 67  75  Resp: 17 20  16   Temp: (!) 97.5 F (36.4 C) 98 F (36.7 C)  (!) 97.4 F (36.3 C)  TempSrc: Oral Oral  Oral  SpO2: 91% 92% 98% 100%  Weight:  52.5 kg  Height:        Intake/Output Summary (Last 24 hours) at 09/04/2019 1207 Last data filed at 09/04/2019 0900 Gross per 24 hour  Intake 1747.91 ml  Output 100 ml  Net 1647.91 ml   Filed Weights   09/02/19 0413 09/03/19 0400 09/04/19 0333  Weight: 50.5 kg 51 kg 52.5 kg    Examination:  General exam: NAD Respiratory system: Crackles bases.  Cardiovascular system: S 1, S 2 RRR, Systolic Murmur Gastrointestinal system: BS present, soft, nt Central nervous system: Alert Extremities: no  edema   Data Reviewed: I have personally reviewed following labs and imaging studies  CBC: Recent Labs  Lab 08/31/19 0440 08/31/19 0440 11/22/19 0558 11/22/19 1657 11/22/19 1658 11/22/19 1702 09/02/19 0426 09/03/19 0802 09/04/19 0728  WBC 5.3  --  9.0  --   --   --  9.4 9.1 8.1  HGB 10.8*   < > 11.4*   < > 10.5* 10.2* 10.7* 9.8* 9.5*  HCT 34.1*   < > 36.1   < > 31.0* 30.0* 33.1* 31.4* 30.1*  MCV 94.2  --  94.8  --   --   --  95.1 94.9 94.1  PLT 222  --  251  --   --   --  224 191 195   < > = values in this interval not displayed.   Basic Metabolic Panel: Recent Labs  Lab 08/31/19 0440 08/31/19 0440 11/22/19 0558 11/22/19 1657 11/22/19 1658 11/22/19 1702 09/02/19 0426 09/03/19 0802 09/03/19 1058 09/04/19 0728  NA 137   < > 134*   < > 138 138 136 135  --  133*  K 3.2*   < > 3.9   < > 3.4* 3.2* 3.5 4.2  --  3.7  CL 100  --  95*  --   --   --  101 99  --  101  CO2 27  --  26  --   --   --  21* 22  --  22  GLUCOSE 100*  --  282*  --   --   --  257* 144*  --  191*  BUN 20  --  23  --   --   --  22 40*  --  50*  CREATININE 0.97   < > 1.16*  --   --   --  0.99 2.38* 2.69* 3.88*  CALCIUM 8.8*  --  8.9  --   --   --  8.5* 8.3*  --  7.8*  MG 1.5*  --   --   --   --   --   --   --   --   --   PHOS 3.9  --   --   --   --   --   --   --   --   --    < > = values in this interval not displayed.   GFR: Estimated Creatinine Clearance: 8.1 mL/min (A) (by C-G formula based on SCr of 3.88 mg/dL (H)). Liver Function Tests: Recent Labs  Lab 08/31/19 0440  ALBUMIN 3.0*   No results for input(s): LIPASE, AMYLASE in the last 168 hours. No results for input(s): AMMONIA in the last 168 hours. Coagulation Profile: No results for input(s): INR, PROTIME in the last 168 hours. Cardiac Enzymes: No results for input(s): CKTOTAL, CKMB, CKMBINDEX, TROPONINI in the last 168 hours. BNP (last 3 results) No results for input(s): PROBNP in the last 8760 hours.  HbA1C: No results for  input(s): HGBA1C in the last 72 hours. CBG: Recent Labs  Lab 09/03/19 1637 09/03/19 2147 09/04/19 0328 09/04/19 0621 09/04/19 1054  GLUCAP 172* 232* 150* 182* 269*   Lipid Profile: No results for input(s): CHOL, HDL, LDLCALC, TRIG, CHOLHDL, LDLDIRECT in the last 72 hours. Thyroid Function Tests: No results for input(s): TSH, T4TOTAL, FREET4, T3FREE, THYROIDAB in the last 72 hours. Anemia Panel: No results for input(s): VITAMINB12, FOLATE, FERRITIN, TIBC, IRON, RETICCTPCT in the last 72 hours. Sepsis Labs: No results for input(s): PROCALCITON, LATICACIDVEN in the last 168 hours.  Recent Results (from the past 240 hour(s))  SARS Coronavirus 2 by RT PCR (hospital order, performed in [facility] hospital lab) Nasopharyngeal Nasopharyngeal Swab     Status: None   Collection Time: 09/14/2019  7:56 PM   Specimen: Nasopharyngeal Swab  Result Value Ref Range Status   SARS Coronavirus 2 NEGATIVE NEGATIVE Final    Comment: (NOTE) SARS-CoV-2 target nucleic acids are NOT DETECTED. The SARS-CoV-2 RNA is generally detectable in upper and lower respiratory specimens during the acute phase of infection. The lowest concentration of SARS-CoV-2 viral copies this assay can detect is 250 copies / mL. A negative result does not preclude SARS-CoV-2 infection and should not be used as the sole basis for treatment or other patient management decisions.  A negative result may occur with improper specimen collection / handling, submission of specimen other than nasopharyngeal swab, presence of viral mutation(s) within the areas targeted by this assay, and inadequate number of viral copies (<250 copies / mL). A negative result must be combined with clinical observations, patient history, and epidemiological information. Fact Sheet for Patients:   BoilerBrush.com.cy Fact Sheet for Healthcare Providers: https://pope.com/ This test is not yet approved or  cleared  by the [place] FDA and has been authorized for detection and/or diagnosis of SARS-CoV-2 by FDA under an Emergency Use Authorization (EUA).  This EUA will remain in effect (meaning this test can be used) for the duration of the COVID-19 declaration under Section 564(b)(1) of the Act, 21 U.S.C. section 360bbb-3(b)(1), unless the authorization is terminated or revoked sooner. Performed at [facility], [address], [place], [place] [address]          Radiology Studies: US RENAL  Result Date: 09/03/2019 CLINICAL DATA:  86 year female with acute kidney injury EXAM: RENAL / URINARY TRACT ULTRASOUND COMPLETE COMPARISON:  None. FINDINGS: Right Kidney: Length: 11.3 cm x 4.2 cm x 4.7 cm, 97 cc. Echogenicity within normal limits. No mass or hydronephrosis visualized. Left Kidney: Length: 9.0 cm x 5.0 cm x 4.3 cm 102 cc. Echogenicity within normal limits. No mass or hydronephrosis visualized. Bladder: Appears normal for degree of bladder distention. IMPRESSION: Negative for hydronephrosis of the left or right kidney. Electronically Signed   By: Gilmer Mor D.O.   On: 09/03/2019 10:49   CT CORONARY MORPH W/CTA COR W/SCORE W/CA W/CM &/OR WO/CM  Result Date: 09/02/2019 CLINICAL DATA:  Aortic Stenosis EXAM: Cardiac TAVR CT TECHNIQUE: The patient was scanned on a Siemens Force 192 slice scanner. A 120 kV retrospective scan was triggered in the ascending thoracic aorta at 140 HU's. Gantry rotation speed was 250 msecs and collimation was .6 mm. No beta blockade or nitro were given. The 3D data set was reconstructed in 5% intervals of the R-R cycle. Systolic and diastolic phases were analyzed on a dedicated work station using MPR, MIP and VRT modes. The patient received 80 cc of  contrast. FINDINGS: Aortic Valve: Tri leaflet calcified with restricted leaflet motion Aorta: No aneurysm normal arch vessels moderate calcific atherosclerosis Sino-tubular Junction: 23 mm Ascending  Thoracic Aorta: 26 mm Aortic Arch: 26 mm Descending Thoracic Aorta: 19 mm Sinus of Valsalva Measurements: Non-coronary: 29 mm Right - coronary: 28.4 mm Left -   coronary: 28.3 mm Coronary Artery Height above Annulus: Left Main: 13.1 mm above annulus Right Coronary: 14.4 mm above annulus Virtual Basal Annulus Measurements: Maximum / Minimum Diameter: 25.6 mm x 20.65 mm Perimeter: 72.4 mm Area: 388 mm2 Coronary Arteries: Sufficient height above annulus for deployment Patent SVG to OM and Patent LIMA to LAD Optimum Fluoroscopic Angle for Delivery: LAO 14 Caudal 14 degrees IMPRESSION: 1. Tri leaflet AV with annular area of 388 mm2 suitable for a 23 mm Sapien 3 valve 2. Coronary arteries suitable height above annulus for deployment Patent SVG to OM and LIMA to LAD 3.  Normal ascending aortic root diameter 2.6 cm 4. Optimum angiographic angle for deployment LAO 14 Caudal 14 degrees [place] Electronically Signed   By: Jenkins Rouge M.D.   On: 09/02/2019 17:15   VAS US CAROTID  Result Date: 09/03/2019 Carotid Arterial Duplex Study Indications:      Pre-Op TAVR. Risk Factors:     Hypertension, hyperlipidemia, Diabetes, coronary artery                   disease. Other Factors:    Aortic valve disease, CKD III. Comparison Study: No prior study on file Performing Technologist: Sharion Dove RVS  Examination Guidelines: A complete evaluation includes B-mode imaging, spectral Doppler, color Doppler, and power Doppler as needed of all accessible portions of each vessel. Bilateral testing is considered an integral part of a complete examination. Limited examinations for reoccurring indications may be performed as noted.  Right Carotid Findings: +----------+--------+--------+--------+------------------+------------------+             PSV cm/s EDV cm/s Stenosis Plaque Description Comments            +----------+--------+--------+--------+------------------+------------------+  CCA Prox   57       7                                                         +----------+--------+--------+--------+------------------+------------------+  CCA Distal 59       13                                   intimal thickening  +----------+--------+--------+--------+------------------+------------------+  ICA Prox   53       14                calcific                               +----------+--------+--------+--------+------------------+------------------+  ICA Distal 53       12                                                       +----------+--------+--------+--------+------------------+------------------+  ECA  49       2                                                        +----------+--------+--------+--------+------------------+------------------+ +----------+--------+-------+--------+-------------------+             PSV cm/s EDV cms Describe Arm Pressure (mmHG)  +----------+--------+-------+--------+-------------------+  Subclavian 90                                             +----------+--------+-------+--------+-------------------+ +---------+--------+--+--------+--+  Vertebral PSV cm/s 54 EDV cm/s 12  +---------+--------+--+--------+--+  Left Carotid Findings: +----------+--------+--------+--------+------------------+------------------+             PSV cm/s EDV cm/s Stenosis Plaque Description Comments            +----------+--------+--------+--------+------------------+------------------+  CCA Prox   60       13                                   intimal thickening  +----------+--------+--------+--------+------------------+------------------+  CCA Distal 53       6                                    intimal thickening  +----------+--------+--------+--------+------------------+------------------+  ICA Prox   57       15                calcific                               +----------+--------+--------+--------+------------------+------------------+  ICA Distal 67       24                                                        +----------+--------+--------+--------+------------------+------------------+  ECA        81       15                                                       +----------+--------+--------+--------+------------------+------------------+ +----------+--------+--------+--------+-------------------+             PSV cm/s EDV cm/s Describe Arm Pressure (mmHG)  +----------+--------+--------+--------+-------------------+  Subclavian 100                                             +----------+--------+--------+--------+-------------------+ +---------+--------+--+--------+--+  Vertebral PSV cm/s 66 EDV cm/s 13  +---------+--------+--+--------+--+   Summary: Right Carotid: Velocities in the right ICA are consistent with a 1-39% stenosis. Left Carotid: Velocities in the left ICA are consistent with a 1-39% stenosis. Vertebrals:  Bilateral vertebral arteries demonstrate  antegrade flow. Subclavians: Normal flow hemodynamics were seen in bilateral subclavian              arteries. *See table(s) above for measurements and observations.  Electronically signed by Lemar Livings MD on 09/03/2019 at 10:42:53 AM.    Final         Scheduled Meds:  aspirin  81 mg Oral Daily   heparin injection (subcutaneous)  5,000 Units Subcutaneous Q8H   insulin pump   Subcutaneous TID WC, HS, 0200   lactulose  20 g Oral Daily   levothyroxine  50 mcg Oral QAC breakfast   metoprolol tartrate  50 mg Oral BID   NIFEdipine  60 mg Oral Daily   senna-docusate  1 tablet Oral BID   simvastatin  20 mg Oral q1800   sodium chloride flush  3 mL Intravenous Q12H   Continuous Infusions:  sodium chloride       LOS: 6 days    Time spent: 35 minutes    Marvina Danner A Mael Delap, MD [facility]   If 7PM-7AM, please contact night-coverage [email or web address]  09/04/2019, 12:07 PM

## 2019-09-05 DIAGNOSIS — I10 Essential (primary) hypertension: Secondary | ICD-10-CM

## 2019-09-05 LAB — GLUCOSE, CAPILLARY
Glucose-Capillary: 203 mg/dL — ABNORMAL HIGH (ref 70–99)
Glucose-Capillary: 211 mg/dL — ABNORMAL HIGH (ref 70–99)
Glucose-Capillary: 279 mg/dL — ABNORMAL HIGH (ref 70–99)
Glucose-Capillary: 237 mg/dL — ABNORMAL HIGH (ref 70–99)
Glucose-Capillary: 218 mg/dL — ABNORMAL HIGH (ref 70–99)

## 2019-09-05 LAB — BASIC METABOLIC PANEL
Anion gap: 14 (ref 5–15)
BUN: 64 mg/dL — ABNORMAL HIGH (ref 8–23)
CO2: 18 mmol/L — ABNORMAL LOW (ref 22–32)
Calcium: 7.9 mg/dL — ABNORMAL LOW (ref 8.9–10.3)
Chloride: 98 mmol/L (ref 98–111)
Creatinine, Ser: 4.95 mg/dL — ABNORMAL HIGH (ref 0.44–1.00)
GFR calc Af Amer: 8 mL/min — ABNORMAL LOW (ref >60)
GFR calc non Af Amer: 7 mL/min — ABNORMAL LOW (ref >60)
Glucose, Bld: 205 mg/dL — ABNORMAL HIGH (ref 70–99)
Potassium: 3.6 mmol/L (ref 3.5–5.1)
Sodium: 130 mmol/L — ABNORMAL LOW (ref 135–145)

## 2019-09-05 MED ORDER — FUROSEMIDE 10 MG/ML IJ SOLN
80.0000 mg | Freq: Once | INTRAMUSCULAR | Status: AC
  Administered 2019-09-05: 80 mg via INTRAVENOUS
  Filled 2019-09-05: qty 8

## 2019-09-05 MED ORDER — SODIUM BICARBONATE 650 MG PO TABS
650.0000 mg | ORAL_TABLET | Freq: Three times a day (TID) | ORAL | Status: DC
  Administered 2019-09-05 – 2019-09-06 (×6): 650 mg via ORAL
  Filled 2019-09-05 (×6): qty 1

## 2019-09-05 MED ORDER — LACTULOSE 10 GM/15ML PO SOLN: 20.0000 g | Freq: Every day | ORAL | Status: DC | PRN

## 2019-09-05 NOTE — Progress Notes (Signed)
PROGRESS NOTE    Nancy Mcguire  KAJ:[id number] DOB: [birth year] DOA: 09-04-2019 PCP: Angelica Chessman, MD   Brief Narrative: 86 year old with past medical history significant for diastolic heart failure not on diuretics, moderate to severe pulmonary hypertension, severe aortic stenosis, heart murmur, CAD, carotid artery stenosis, CKD stage III AAA, diabetes type 2 on insulin pump, hypothyroidism, chronic back pain who presented with worsening shortness of breath and dry cough. Patient was admitted with acute on chronic diastolic heart failure exacerbation, chest x-ray showed cardiomegaly and small bilateral pleural effusion, CTA negative for PE, BNP 1024.   Assessment & Plan:   Active Problems:   Hyperlipidemia   Essential hypertension   Coronary artery disease   Diabetes type 1, controlled (HCC)   Cardiac murmur   Volume overload   Severe aortic stenosis   Acute diastolic heart failure (HCC)   Aortic atherosclerosis (HCC)  1-Chronic diastolic heart failure exacerbation, severe pulmonary hypertension: Echo with ejection fraction 60 to 65%, severe aortic valve stenosis Receive IV Lasix 40 mg IV twice daily initially.  Weight 120---112---111--112--118 Cardiology consulted and following.  Due to AKI, lasix was on hold. Nephrology planing to give one time dose of lasix due to hypoxemia.   2-Aortic Valve Severe Stenosis: Cardiology consulted Cardiology has consulted TAVR team for further evaluation. Had Cath: LIMA and Circumflex graft open, diagonal graft with obstruction.  Underwent  CT scans, CVTS sx consult Monday, TVAR on Tuesday.    Acute on CKD stage III a; related to contrast.  Lasix and ACE on hold.  Cr up to 2.4--3.6--4 Oliguric.  Renal US; hydronephrosis Received IV fluids.  We will hold further IV hydration due to crackles and mild hypoxemia. Nephrology has been consulted. Recommend lasix today , one time dose due to hypoxemia, weight up.  Pyuria. 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 Denies chest pain  Controlled diabetes type 1 with hyperglycemia: Continue insulin pump CBG increasing. Appreciate Diabetes educator follow up.   Essential hypertension: Continue with losartan and metoprolol.  Hypothyroidism continue with Synthroid.  Pulmonary HTN; cardiology consulted.   Hypomagnesemia: Replete. Constipation: started  senna, lactulose. Will order dulcolax supp  Estimated body mass index is 21.73 kg/m as calculated from the following:   Height as of this encounter: 5\' 2"  (1.575 m).   Weight as of this encounter: 53.9 kg.   DVT prophylaxis: Lovenox Code Status: DNR DNI Family Communication: Daughter  Disposition Plan:  Status is: Inpatient  AWaiting cardiology evaluation for aortic valvular stenosis, transition from IV to oral Lasix  Dispo: The patient is from: Home              Anticipated d/c is to: Home               Anticipated d/c date is: 2 or 3 days              Patient currently; not medical stable for discharge        Consultants:   Cardiology  Procedures:   Echo  Antimicrobials:  None  Subjective: Back on oxygen, trying to urinate/  Had BM yesterday. Not eating a lot.   Objective: Vitals:   09/04/19 2257 09/05/19 0331 09/05/19 0340 09/05/19 0840  BP: (!) 115/56 97/70  (!) 106/56  Pulse: 70 93 100   Resp:  16    Temp:  97.7 F (36.5 C)    TempSrc:  Oral    SpO2:  09/07/19)  86% 91%   Weight:  53.9 kg    Height:        Intake/Output Summary (Last 24 hours) at 09/05/2019 1132 Last data filed at 09/05/2019 6754 Gross per 24 hour  Intake 940 ml  Output 302 ml  Net 638 ml   Filed Weights   09/03/19 0400 09/04/19 0333 09/05/19 0331  Weight: 51 kg 52.5 kg 53.9 kg    Examination:  General exam: NAD Respiratory system: B/L crackles.  Cardiovascular system: S 1, S 2 RRR, Systolic murmur Gastrointestinal system:BS present, soft, nt Central  nervous system: alert Extremities: No edema   Data Reviewed: I have personally reviewed following labs and imaging studies  CBC: Recent Labs  Lab 08/31/19 0440 08/31/19 0440 08/31/2019 0558 08/16/2019 1657 08/28/2019 1658 09/02/2019 1702 09/02/19 0426 09/03/19 0802 09/04/19 0728  WBC 5.3  --  9.0  --   --   --  9.4 9.1 8.1  HGB 10.8*   < > 11.4*   < > 10.5* 10.2* 10.7* 9.8* 9.5*  HCT 34.1*   < > 36.1   < > 31.0* 30.0* 33.1* 31.4* 30.1*  MCV 94.2  --  94.8  --   --   --  95.1 94.9 94.1  PLT 222  --  251  --   --   --  224 191 195   < > = values in this interval not displayed.   Basic Metabolic Panel: Recent Labs  Lab 08/31/19 0440 08/31/19 0440 08/31/2019 0558 08/21/2019 0558 09/04/2019 1657 08/31/2019 1702 09/02/19 0426 09/03/19 0802 09/03/19 1058 09/04/19 0728 09/05/19 0645  NA 137   < > 134*  --    < > 138 136 135  --  133* 130*  K 3.2*   < > 3.9  --    < > 3.2* 3.5 4.2  --  3.7 3.6  CL 100   < > 95*  --   --   --  101 99  --  101 98  CO2 27   < > 26  --   --   --  21* 22  --  22 18*  GLUCOSE 100*   < > 282*  --   --   --  257* 144*  --  191* 205*  BUN 20   < > 23  --   --   --  22 40*  --  50* 64*  CREATININE 0.97   < > 1.16*   < >  --   --  0.99 2.38* 2.69* 3.88* 4.95*  CALCIUM 8.8*   < > 8.9  --   --   --  8.5* 8.3*  --  7.8* 7.9*  MG 1.5*  --   --   --   --   --   --   --   --   --   --   PHOS 3.9  --   --   --   --   --   --   --   --   --   --    < > = values in this interval not displayed.   GFR: Estimated Creatinine Clearance: 6.3 mL/min (A) (by C-G formula based on SCr of 4.95 mg/dL (H)). Liver Function Tests: Recent Labs  Lab 08/31/19 0440  ALBUMIN 3.0*   No results for input(s): LIPASE, AMYLASE in the last 168 hours. No results for input(s): AMMONIA in the last 168 hours. Coagulation Profile: No results for input(s):  INR, PROTIME in the last 168 hours. Cardiac Enzymes: No results for input(s): CKTOTAL, CKMB, CKMBINDEX, TROPONINI in the last 168 hours. BNP  (last 3 results) No results for input(s): PROBNP in the last 8760 hours. HbA1C: No results for input(s): HGBA1C in the last 72 hours. CBG: Recent Labs  Lab 09/04/19 1621 09/04/19 2130 09/05/19 0311 09/05/19 0622 09/05/19 1110  GLUCAP 202* 162* 203* 211* 279*   Lipid Profile: No results for input(s): CHOL, HDL, LDLCALC, TRIG, CHOLHDL, LDLDIRECT in the last 72 hours. Thyroid Function Tests: No results for input(s): TSH, T4TOTAL, FREET4, T3FREE, THYROIDAB in the last 72 hours. Anemia Panel: No results for input(s): VITAMINB12, FOLATE, FERRITIN, TIBC, IRON, RETICCTPCT in the last 72 hours. Sepsis Labs: No results for input(s): PROCALCITON, LATICACIDVEN in the last 168 hours.  Recent Results (from the past 240 hour(s))  SARS Coronavirus 2 by RT PCR (hospital order, performed in [facility] hospital lab) Nasopharyngeal Nasopharyngeal Swab     Status: None   Collection Time: 2019/09/24  7:56 PM   Specimen: Nasopharyngeal Swab  Result Value Ref Range Status   SARS Coronavirus 2 NEGATIVE NEGATIVE Final    Comment: (NOTE) SARS-CoV-2 target nucleic acids are NOT DETECTED. The SARS-CoV-2 RNA is generally detectable in upper and lower respiratory specimens during the acute phase of infection. The lowest concentration of SARS-CoV-2 viral copies this assay can detect is 250 copies / mL. A negative result does not preclude SARS-CoV-2 infection and should not be used as the sole basis for treatment or other patient management decisions.  A negative result may occur with improper specimen collection / handling, submission of specimen other than nasopharyngeal swab, presence of viral mutation(s) within the areas targeted by this assay, and inadequate number of viral copies (<250 copies / mL). A negative result must be combined with clinical observations, patient history, and epidemiological information. Fact Sheet for Patients:   BoilerBrush.com.cy Fact Sheet for  Healthcare Providers: https://pope.com/ This test is not yet approved or cleared  by the [place] FDA and has been authorized for detection and/or diagnosis of SARS-CoV-2 by FDA under an Emergency Use Authorization (EUA).  This EUA will remain in effect (meaning this test can be used) for the duration of the COVID-19 declaration under Section 564(b)(1) of the Act, 21 U.S.C. section 360bbb-3(b)(1), unless the authorization is terminated or revoked sooner. Performed at [facility], [address], [place], [place] [address]          Radiology Studies: No results found.      Scheduled Meds: . aspirin  81 mg Oral Daily  . heparin injection (subcutaneous)  5,000 Units Subcutaneous Q8H  . insulin pump   Subcutaneous TID WC, HS, 0200  . levothyroxine  50 mcg Oral QAC breakfast  . NIFEdipine  30 mg Oral Q12H  . senna-docusate  1 tablet Oral BID  . simvastatin  20 mg Oral q1800  . sodium bicarbonate  650 mg Oral TID  . sodium chloride flush  3 mL Intravenous Q12H   Continuous Infusions: . sodium chloride       LOS: 7 days    Time spent: 35 minutes    Belkys A Regalado, MD [facility]   If 7PM-7AM, please contact night-coverage [email or web address]  09/05/2019, 11:32 AM

## 2019-09-05 NOTE — Progress Notes (Signed)
[facility] Progress Note   Subjective:   Had some dyspnea overnight and O2 was applied.  This AM she feels ok.  Poor appetite for days, no dysgeusia or nausea; forcing self to drink.  UOP yesterday, she reports none since MN.  Husband bedside  Objective Vitals:   09/04/19 2257 09/05/19 0331 09/05/19 0340 09/05/19 0840  BP: (!) 115/56 97/70  (!) 106/56  Pulse: 70 93 100   Resp:  16    Temp:  97.7 F (36.5 C)    TempSrc:  Oral    SpO2:  (!) 86% 91%   Weight:  53.9 kg    Height:       Physical Exam General: elderly woman comfortable at 45 degrees Heart: IV/VI syst murmur LUSB Lungs: normal WOB, rales in R base, dec BS L base Abdomen: soft nontender Extremities: no LE edema Neuro: nonfocal, conversant, no asterixis.   Additional Objective Labs: Basic Metabolic Panel: Recent Labs  Lab 08/31/19 0440 08/27/2019 0558 09/03/19 0802 09/03/19 0802 09/03/19 1058 09/04/19 0728 09/05/19 0645  NA 137   < > 135  --   --  133* 130*  K 3.2*   < > 4.2  --   --  3.7 3.6  CL 100   < > 99  --   --  101 98  CO2 27   < > 22  --   --  22 18*  GLUCOSE 100*   < > 144*  --   --  191* 205*  BUN 20   < > 40*  --   --  50* 64*  CREATININE 0.97   < > 2.38*   < > 2.69* 3.88* 4.95*  CALCIUM 8.8*   < > 8.3*  --   --  7.8* 7.9*  PHOS 3.9  --   --   --   --   --   --    < > = values in this interval not displayed.   Liver Function Tests: Recent Labs  Lab 08/31/19 0440  ALBUMIN 3.0*   No results for input(s): LIPASE, AMYLASE in the last 168 hours. CBC: Recent Labs  Lab 08/31/19 0440 08/31/19 0440 08/24/2019 0558 08/13/2019 1657 09/02/19 0426 09/03/19 0802 09/04/19 0728  WBC 5.3   < > 9.0  --  9.4 9.1 8.1  HGB 10.8*   < > 11.4*   < > 10.7* 9.8* 9.5*  HCT 34.1*   < > 36.1   < > 33.1* 31.4* 30.1*  MCV 94.2  --  94.8  --  95.1 94.9 94.1  PLT 222   < > 251  --  224 191 195   < > = values in this interval not displayed.   Blood Culture No results found for: SDES,  SPECREQUEST, CULT, REPTSTATUS  Cardiac Enzymes: No results for input(s): CKTOTAL, CKMB, CKMBINDEX, TROPONINI in the last 168 hours. CBG: Recent Labs  Lab 09/04/19 1054 09/04/19 1621 09/04/19 2130 09/05/19 0311 09/05/19 0622  GLUCAP 269* 202* 162* 203* 211*   Iron Studies: No results for input(s): IRON, TIBC, TRANSFERRIN, FERRITIN in the last 72 hours. @lablastinr3 @ Studies/Results: RENAL  Result Date: 09/03/2019 CLINICAL DATA:  86 year female with acute kidney injury EXAM: RENAL / URINARY TRACT ULTRASOUND COMPLETE COMPARISON:  None. FINDINGS: Right Kidney: Length: 11.3 cm x 4.2 cm x 4.7 cm, 97 cc. Echogenicity within normal limits. No mass or hydronephrosis visualized. Left Kidney: Length: 9.0 cm x 5.0 cm x 4.3 cm 102  cc. Echogenicity within normal limits. No mass or hydronephrosis visualized. Bladder: Appears normal for degree of bladder distention. IMPRESSION: Negative for hydronephrosis of the left or right kidney. Electronically Signed   By: Gilmer Mor D.O.   On: 09/03/2019 10:49   VAS US CAROTID  Result Date: 09/03/2019 Carotid Arterial Duplex Study Indications:      Pre-Op TAVR. Risk Factors:     Hypertension, hyperlipidemia, Diabetes, coronary artery                   disease. Other Factors:    Aortic valve disease, CKD III. Comparison Study: No prior study on file Performing Technologist: Sherren Kerns RVS  Examination Guidelines: A complete evaluation includes B-mode imaging, spectral Doppler, color Doppler, and power Doppler as needed of all accessible portions of each vessel. Bilateral testing is considered an integral part of a complete examination. Limited examinations for reoccurring indications may be performed as noted.  Right Carotid Findings: +----------+--------+--------+--------+------------------+------------------+           PSV cm/sEDV cm/sStenosisPlaque DescriptionComments            +----------+--------+--------+--------+------------------+------------------+ CCA Prox  57      7                                                    +----------+--------+--------+--------+------------------+------------------+ CCA Distal59      13                                intimal thickening +----------+--------+--------+--------+------------------+------------------+ ICA Prox  53      14              calcific                             +----------+--------+--------+--------+------------------+------------------+ ICA Distal53      12                                                   +----------+--------+--------+--------+------------------+------------------+ ECA       49      2                                                    +----------+--------+--------+--------+------------------+------------------+ +----------+--------+-------+--------+-------------------+           PSV cm/sEDV cmsDescribeArm Pressure (mmHG) +----------+--------+-------+--------+-------------------+ [id number]                                         +----------+--------+-------+--------+-------------------+ +---------+--------+--+--------+--+ VertebralPSV cm/s54EDV cm/s12 +---------+--------+--+--------+--+  Left Carotid Findings: +----------+--------+--------+--------+------------------+------------------+           PSV cm/sEDV cm/sStenosisPlaque DescriptionComments           +----------+--------+--------+--------+------------------+------------------+ CCA Prox  60      13  intimal thickening +----------+--------+--------+--------+------------------+------------------+ CCA Distal53      6                                 intimal thickening +----------+--------+--------+--------+------------------+------------------+ ICA Prox  57      15              calcific                              +----------+--------+--------+--------+------------------+------------------+ ICA Distal67      24                                                   +----------+--------+--------+--------+------------------+------------------+ ECA       81      15                                                   +----------+--------+--------+--------+------------------+------------------+ +----------+--------+--------+--------+-------------------+           PSV cm/sEDV cm/sDescribeArm Pressure (mmHG) +----------+--------+--------+--------+-------------------+ Subclavian100                                         +----------+--------+--------+--------+-------------------+ +---------+--------+--+--------+--+ VertebralPSV cm/s66EDV cm/s13 +---------+--------+--+--------+--+   Summary: Right Carotid: Velocities in the right ICA are consistent with a 1-39% stenosis. Left Carotid: Velocities in the left ICA are consistent with a 1-39% stenosis. Vertebrals:  Bilateral vertebral arteries demonstrate antegrade flow. Subclavians: Normal flow hemodynamics were seen in bilateral subclavian              arteries. *See table(s) above for measurements and observations.  Electronically signed by Servando Snare MD on 09/03/2019 at 10:42:53 AM.    Final    Medications: . sodium chloride     . aspirin  81 mg Oral Daily  . heparin injection (subcutaneous)  5,000 Units Subcutaneous Q8H  . insulin pump   Subcutaneous TID WC, HS, 0200  . levothyroxine  50 mcg Oral QAC breakfast  . NIFEdipine  30 mg Oral Q12H  . senna-docusate  1 tablet Oral BID  . simvastatin  20 mg Oral q1800  . sodium chloride flush  3 mL Intravenous Q12H    Assessment/ Plan: 1. AKI, severe - most likely contrast nephropathy (sp CT x 2) w/ ARB effect also possible . Creat 0.8 at baseline on admission, trending up to 3.8 yest and 4.95 today but with UOP 320mL yest. No signs of uremia.     Volume status seems a bit worse with O2 on now, rales  on exam; will give lasix 80 IV x 1 now.  Bladder scan, place foley if retaining. .  Agree w/ current supportive care management.  Await recovery. Will follow closely.  Discussed indications for dialysis with she and husband today, hopefully will open up in next few days but if not could offer temporary HD as bridge to recoveyr.  2. Severe AS - w/u in progress for TAVR 3. HTN - losartan dc'd 5/22, metop 5/23. BP's are still soft,  procardia xl  30 bid for now w/ hold orders. Let BP's come up some w/ AKI.  4.  CAD h/o CABG remote 5.  Chron diast CHF: stable at this time 6. Metabolic acidosis: secondary to AKI; na bicarb 650 TID po.  7. Hyponatremia:    With rales and O2 requirement concern for hypervolemic hyponatremia. Counseled to drink to thirst.  Lasix per above.   Estill Bakes MD 09/05/2019, 8:58 AM  [facility] Pager: [phone]

## 2019-09-05 NOTE — Progress Notes (Signed)
Progress Note  Patient Name: Nancy Mcguire Date of Encounter: 09/05/2019  Primary Cardiologist: Nanetta Batty, MD   Subjective   On O2 by nasal cannula. Had a difficult night with her breathing, feels much better with O2 applied. Hasn't made any urine yet this AM. Discussed Cr today, and she and husband just spoke to nephrology about this as well.  Inpatient Medications    Scheduled Meds: . aspirin  81 mg Oral Daily  . furosemide  80 mg Intravenous Once  . heparin injection (subcutaneous)  5,000 Units Subcutaneous Q8H  . insulin pump   Subcutaneous TID WC, HS, 0200  . levothyroxine  50 mcg Oral QAC breakfast  . NIFEdipine  30 mg Oral Q12H  . senna-docusate  1 tablet Oral BID  . simvastatin  20 mg Oral q1800  . sodium bicarbonate  650 mg Oral TID  . sodium chloride flush  3 mL Intravenous Q12H   Continuous Infusions: . sodium chloride     PRN Meds: sodium chloride, acetaminophen, lactulose, ondansetron (ZOFRAN) IV, sodium chloride flush   Vital Signs    Vitals:   09/04/19 2257 09/05/19 0331 09/05/19 0340 09/05/19 0840  BP: (!) 115/56 97/70  (!) 106/56  Pulse: 70 93 100   Resp:  16    Temp:  97.7 F (36.5 C)    TempSrc:  Oral    SpO2:  (!) 86% 91%   Weight:  53.9 kg    Height:        Intake/Output Summary (Last 24 hours) at 09/05/2019 1023 Last data filed at 09/05/2019 0807 Gross per 24 hour  Intake 940 ml  Output 302 ml  Net 638 ml   Last 3 Weights 09/05/2019 09/04/2019 09/03/2019  Weight (lbs) 118 lb 12.8 oz 115 lb 11.2 oz 112 lb 6.4 oz  Weight (kg) 53.887 kg 52.481 kg 50.984 kg      Telemetry    NSR - Personally Reviewed  ECG    No new since 09/01/2019 - Personally Reviewed  Physical Exam   GEN: No acute distress.  O2 by nasal cannula in place Neck: No JVD appreciated at 45 degrees Cardiac: RRR, 3/6 SEM, no rubs, or gallops.  Respiratory: Clear to auscultation bilaterally except for bilateral crackles at bases GI: Soft, nontender, non-distended    MS: No edema; No deformity. Neuro:  Nonfocal  Psych: Normal affect   Labs    High Sensitivity Troponin:   Recent Labs  Lab 08/23/2019 1220 08/18/2019 1539  TROPONINIHS 21* 26*      Chemistry Recent Labs  Lab 08/31/19 0440 08/25/2019 0558 09/03/19 0802 09/03/19 0802 09/03/19 1058 09/04/19 0728 09/05/19 0645  NA 137   < > 135  --   --  133* 130*  K 3.2*   < > 4.2  --   --  3.7 3.6  CL 100   < > 99  --   --  101 98  CO2 27   < > 22  --   --  22 18*  GLUCOSE 100*   < > 144*  --   --  191* 205*  BUN 20   < > 40*  --   --  50* 64*  CREATININE 0.97   < > 2.38*   < > 2.69* 3.88* 4.95*  CALCIUM 8.8*   < > 8.3*  --   --  7.8* 7.9*  ALBUMIN 3.0*  --   --   --   --   --   --  GFRNONAA 53*   < > 18*   < > 15* 10* 7*  GFRAA >60   < > 21*   < > 18* 11* 8*  ANIONGAP 10   < > 14  --   --  10 14   < > = values in this interval not displayed.     Hematology Recent Labs  Lab 09/02/19 0426 09/03/19 0802 09/04/19 0728  WBC 9.4 9.1 8.1  RBC 3.48* 3.31* 3.20*  HGB 10.7* 9.8* 9.5*  HCT 33.1* 31.4* 30.1*  MCV 95.1 94.9 94.1  MCH 30.7 29.6 29.7  MCHC 32.3 31.2 31.6  RDW 16.1* 15.9* 15.6*  PLT 224 191 195    BNP Recent Labs  Lab 09/05/2019 1539  BNP 1,024.0*     DDimer No results for input(s): DDIMER in the last 168 hours.   Radiology    No results found.  Cardiac Studies   Cath 08/27/2019  Prox LAD to Mid LAD lesion is 90% stenosed.  Mid LAD-1 lesion is 90% stenosed.  Mid LAD-2 lesion is 90% stenosed.  Origin to Prox Graft lesion is 100% stenosed.  Ost Cx to Prox Cx lesion is 90% stenosed.  2nd Mrg lesion is 90% stenosed.  Mid Graft to Dist Graft lesion is 60% stenosed.  Prox Graft to Mid Graft lesion is 40% stenosed.  Hemodynamic findings consistent with aortic valve stenosis.  IMPRESSION: Ms. Nader has 2 of 3 grafts patent in the left dominant system.  The vein graft to the diagonal branch is occluded at the origin.  The LIMA to the LAD is patent with  significant proximal LAD disease proximal to LIMA insertion.  The vein graft to the circumflex has moderate disease in the mid and distal shaft.  The RCA is nondominant.  She has severe aortic stenosis with a valve area between 0.6 1.8 by Fick or thermal dilution and EDP of 18.  I did shoot her distal abdominal aorta revealing widely patent iliacs arteries.  I reviewed the films with Dr. Clifton James who feels that she is a acceptable candidate for TAVR.  I performed angiography of the right common femoral artery and and successfully deployed a MYNX closure device in the right common femoral artery and vein achieving hemostasis.  The patient left lab in stable condition.  Hemodynamic findings consistent with aortic valve stenosis. Right atrial pressure-5/7 Right ventricular pressure 56 systolic Pulmonary artery pressure-54/17, mean 31 Pulmonary wedge pressure-A-wave 14, V wave 15, mean 12 LVEDP-18 Strict cardiac output-4.9 L/min with an index of 3.3 L/min/m by Fick, 5.6 L/min with an index of 4.4 L/min/m by thermodilution Aortic valve area-0.81 cm by thermodilution, 0.61 cm by Fick  Echo 08/30/19 1. Left ventricular ejection fraction, by estimation, is 60 to 65%. The  left ventricle has normal function. The left ventricle has no regional  wall motion abnormalities. There is mild left ventricular hypertrophy.  Left ventricular diastolic parameters  are consistent with Grade II diastolic dysfunction (pseudonormalization).  Elevated left ventricular end-diastolic pressure.  2. Right ventricular systolic function is mildly reduced. The right  ventricular size is normal. There is severely elevated pulmonary artery  systolic pressure. The estimated right ventricular systolic pressure is  82.2 mmHg.  3. Left atrial size was mildly dilated.  4. Right atrial size was mildly dilated.  5. The mitral valve is abnormal. Mild to moderate mitral valve  regurgitation.  6. Tricuspid valve regurgitation  is mild to moderate.  7. The aortic valve is tricuspid. Aortic valve regurgitation is  trivial.  Severe aortic valve stenosis. Aortic valve area, by VTI measures 0.36 cm.  Aortic valve mean gradient measures 51.0 mmHg. Aortic valve Vmax measures  4.68 m/s.  8. The inferior vena cava is dilated in size with <50% respiratory  variability, suggesting right atrial pressure of 15 mmHg.   Comparison(s): Changes from prior study are noted. 01/13/2019: LVEF 60-65%,  severe AS - mean gradient 43 mmHg.    Patient Profile     86 y.o. female with PMH of HTN, CAD (CABG X3 26 yrs ago), DM-1(on insulin pump) and severe aortic stenosis. Admitted for acute on chronic diastolic heart failure exacerbation, complicated by acute kidney injury.  Assessment & Plan    Acute on chronic diastolic heart failure, complicated by severe aortic stenosis, pulmonary hypertension, acute kidney injury -appreciate nephrology input. Trialing IV lasix this AM given O2 requirement, rales at bases -weight today 53.9 kg, admission weight 54.4 kg. Net negative 400cc -Cr today 4.95, 300 cc of urine reported yesterday, none yet today  Acute kidney injury:  -appreciate nephrology evaluation -suspected 2/2 contrast nephropathy given two CT scans and cath -Cr today 4.95, 300 cc of urine reported yesterday, none charted yet today  History of CAD s/p prior CABG: -medical management with statin -on aspirin 81 mg daily  Type I diabetes, on insulin pump: -per primary team  Hypertension: BP has been running lower -holding home losartan 100 mg daily given AKI -holding home metoprolol 50 mg BID -home nifedipine 60 mg daily split to 30 mg q12hours with hold parameters per nephrology 5/23  Hypothyroidism: -on levothyroxine  For questions or updates, please contact [place] Please consult [email or web address] for contact info under     Signed, Buford Dresser, MD  09/05/2019, 10:23 AM

## 2019-09-06 DIAGNOSIS — I2510 Atherosclerotic heart disease of native coronary artery without angina pectoris: Secondary | ICD-10-CM

## 2019-09-06 DIAGNOSIS — I350 Nonrheumatic aortic (valve) stenosis: Secondary | ICD-10-CM

## 2019-09-06 LAB — HEMOGLOBIN A1C
Hgb A1c MFr Bld: 7.5 % — ABNORMAL HIGH (ref 4.8–5.6)
Mean Plasma Glucose: 168.55 mg/dL

## 2019-09-06 LAB — RENAL FUNCTION PANEL
Albumin: 2.9 g/dL — ABNORMAL LOW (ref 3.5–5.0)
Anion gap: 13 (ref 5–15)
BUN: 58 mg/dL — ABNORMAL HIGH (ref 8–23)
CO2: 22 mmol/L (ref 22–32)
Calcium: 8.1 mg/dL — ABNORMAL LOW (ref 8.9–10.3)
Chloride: 100 mmol/L (ref 98–111)
Creatinine, Ser: 2.88 mg/dL — ABNORMAL HIGH (ref 0.44–1.00)
GFR calc Af Amer: 16 mL/min — ABNORMAL LOW (ref >60)
GFR calc non Af Amer: 14 mL/min — ABNORMAL LOW (ref >60)
Glucose, Bld: 226 mg/dL — ABNORMAL HIGH (ref 70–99)
Phosphorus: 2.9 mg/dL (ref 2.5–4.6)
Potassium: 2.8 mmol/L — ABNORMAL LOW (ref 3.5–5.1)
Sodium: 135 mmol/L (ref 135–145)

## 2019-09-06 LAB — GLUCOSE, CAPILLARY
Glucose-Capillary: 245 mg/dL — ABNORMAL HIGH (ref 70–99)
Glucose-Capillary: 261 mg/dL — ABNORMAL HIGH (ref 70–99)
Glucose-Capillary: 342 mg/dL — ABNORMAL HIGH (ref 70–99)
Glucose-Capillary: 276 mg/dL — ABNORMAL HIGH (ref 70–99)
Glucose-Capillary: 307 mg/dL — ABNORMAL HIGH (ref 70–99)

## 2019-09-06 LAB — CBC
HCT: 32.3 % — ABNORMAL LOW (ref 36.0–46.0)
Hemoglobin: 10.4 g/dL — ABNORMAL LOW (ref 12.0–15.0)
MCH: 29.2 pg (ref 26.0–34.0)
MCHC: 32.2 g/dL (ref 30.0–36.0)
MCV: 90.7 fL (ref 80.0–100.0)
Platelets: 260 10*3/uL (ref 150–400)
RBC: 3.56 MIL/uL — ABNORMAL LOW (ref 3.87–5.11)
RDW: 14.9 % (ref 11.5–15.5)
WBC: 6 10*3/uL (ref 4.0–10.5)
nRBC: 0 % (ref 0.0–0.2)

## 2019-09-06 MED ORDER — INSULIN GLARGINE 100 UNIT/ML ~~LOC~~ SOLN
13.0000 [IU] | Freq: Every day | SUBCUTANEOUS | Status: DC
  Administered 2019-09-06: 13 [IU] via SUBCUTANEOUS
  Filled 2019-09-06 (×2): qty 0.13

## 2019-09-06 MED ORDER — INSULIN ASPART 100 UNIT/ML ~~LOC~~ SOLN
0.0000 [IU] | Freq: Three times a day (TID) | SUBCUTANEOUS | Status: DC
  Administered 2019-09-06: 3 [IU] via SUBCUTANEOUS
  Administered 2019-09-07: 2 [IU] via SUBCUTANEOUS
  Administered 2019-09-07: 1 [IU] via SUBCUTANEOUS
  Administered 2019-09-07 – 2019-09-08 (×2): 2 [IU] via SUBCUTANEOUS
  Administered 2019-09-09: 1 [IU] via SUBCUTANEOUS
  Administered 2019-09-09 – 2019-09-11 (×5): 2 [IU] via SUBCUTANEOUS
  Administered 2019-09-12: 3 [IU] via SUBCUTANEOUS
  Administered 2019-09-12: 1 [IU] via SUBCUTANEOUS
  Administered 2019-09-12: 3 [IU] via SUBCUTANEOUS

## 2019-09-06 MED ORDER — POTASSIUM CHLORIDE CRYS ER 20 MEQ PO TBCR
20.0000 meq | EXTENDED_RELEASE_TABLET | Freq: Once | ORAL | Status: AC
  Administered 2019-09-06: 20 meq via ORAL
  Filled 2019-09-06: qty 1

## 2019-09-06 MED ORDER — POTASSIUM CHLORIDE CRYS ER 20 MEQ PO TBCR
20.0000 meq | EXTENDED_RELEASE_TABLET | Freq: Once | ORAL | Status: AC
  Administered 2019-09-06: 20 meq via ORAL
  Filled 2019-09-06: qty 2

## 2019-09-06 MED ORDER — INSULIN ASPART 100 UNIT/ML ~~LOC~~ SOLN
2.0000 [IU] | Freq: Three times a day (TID) | SUBCUTANEOUS | Status: DC
  Administered 2019-09-06: 2 [IU] via SUBCUTANEOUS

## 2019-09-06 MED ORDER — SODIUM CHLORIDE 0.9 % IV SOLN
1.0000 g | INTRAVENOUS | Status: DC
  Administered 2019-09-06 – 2019-09-08 (×3): 1 g via INTRAVENOUS
  Filled 2019-09-06 (×3): qty 10

## 2019-09-06 MED ORDER — INSULIN ASPART 100 UNIT/ML ~~LOC~~ SOLN
0.0000 [IU] | Freq: Every day | SUBCUTANEOUS | Status: DC
  Administered 2019-09-06: 4 [IU] via SUBCUTANEOUS
  Administered 2019-09-07 – 2019-09-12 (×2): 2 [IU] via SUBCUTANEOUS

## 2019-09-06 NOTE — TOC Initial Note (Signed)
Transition of Care [facility]) - Initial/Assessment Note    Patient Details  Name: Nancy Mcguire MRN: [id number] Date of Birth: Aug 08, 1929  [facility] CM/SW Contact:    Leone Haven, RN Phone Number: 09/06/2019, 11:42 AM  Clinical Narrative:                 NCM spoke with patient and spouse at bedside ,offered choice for HHPT, he chose [facility], referral given to [facility] , he is able to accept referral for HHPT.  Soc will begin 24 to 48 hrs post dc.    Expected Discharge Plan: Home w Home Health Services Barriers to Discharge: Continued Medical Work up   Patient Goals and CMS Choice Patient states their goals for this hospitalization and ongoing recovery are:: get better CMS Medicare.gov Compare Post Acute Care list provided to:: Patient Represenative (must comment) Choice offered to / list presented to : Spouse  Expected Discharge Plan and Services Expected Discharge Plan: Home w Home Health Services In-house Referral: NA Discharge Planning Services: CM Consult Post Acute Care Choice: Home Health Living arrangements for the past 2 months: Single Family Home                   DME Agency: NA       HH Arranged: PT HH Agency: [facility] Date [facility] Agency Contacted: 09/06/19 Time HH Agency Contacted: 1141 Representative spoke with at [facility] Agency: Kandee Keen  Prior Living Arrangements/Services Living arrangements for the past 2 months: Single Family Home Lives with:: Spouse Patient language and need for interpreter reviewed:: Yes Do you feel safe going back to the place where you live?: Yes      Need for Family Participation in Patient Care: Yes (Comment) Care giver support system in place?: Yes (comment) Current home services: DME(rollator, cane) Criminal Activity/Legal Involvement Pertinent to Current Situation/Hospitalization: No - Comment as needed  Activities of Daily Living Home Assistive Devices/Equipment: Other (Comment)("rollator") ADL Screening  (condition at time of admission) Patient's cognitive ability adequate to safely complete daily activities?: Yes Is the patient deaf or have difficulty hearing?: Yes Does the patient have difficulty seeing, even when wearing glasses/contacts?: No Does the patient have difficulty concentrating, remembering, or making decisions?: No Patient able to express need for assistance with ADLs?: Yes Does the patient have difficulty dressing or bathing?: No Independently performs ADLs?: Yes (appropriate for developmental age) Does the patient have difficulty walking or climbing stairs?: Yes Weakness of Legs: Both Weakness of Arms/Hands: None  Permission Sought/Granted                  Emotional Assessment Appearance:: Appears stated age Attitude/Demeanor/Rapport: Engaged Affect (typically observed): Appropriate Orientation: : Oriented to Self, Oriented to Place, Oriented to  Time, Oriented to Situation Alcohol / Substance Use: Not Applicable Psych Involvement: No (comment)  Admission diagnosis:  DOE (dyspnea on exertion) [R06.00] Volume overload [E87.70] Nonspecific chest pain [R07.9] Patient Active Problem List   Diagnosis Date Noted  . Severe aortic stenosis 08/31/2019  . Acute diastolic heart failure (HCC) 08/31/2019  . Aortic atherosclerosis (HCC) 08/31/2019  . Volume overload 09-16-2019  . DOE (dyspnea on exertion)   . Nonspecific chest pain   . Cardiac murmur 01/04/2019  . Carotid bruit 01/04/2019  . Hyperlipidemia 09/16/2017  . Essential hypertension 09/16/2017  . Coronary artery disease 09/16/2017  . Diabetes type 1, controlled (HCC) 09/16/2017   PCP:  Angelica Chessman, MD Pharmacy:   [facility] [phone] - [place], [place] -  [address] [place] [address] Phone: [phone] Fax: [phone]     Social Determinants of Health (SDOH) Interventions    Readmission Risk Interventions No flowsheet data found.

## 2019-09-06 NOTE — Progress Notes (Signed)
Progress Note  Patient Name: Nancy Mcguire Date of Encounter: 09/06/2019  Primary Cardiologist: Nancy Batty, MD   Subjective   Feeling better this AM. Made about 700 ml urine yesterday. Sitting comfortably in chair this AM. Reviewed plan, all questions answered.  Inpatient Medications    Scheduled Meds: . aspirin  81 mg Oral Daily  . heparin injection (subcutaneous)  5,000 Units Subcutaneous Q8H  . insulin pump   Subcutaneous TID WC, HS, 0200  . levothyroxine  50 mcg Oral QAC breakfast  . NIFEdipine  30 mg Oral Q12H  . potassium chloride  20 mEq Oral Once  . senna-docusate  1 tablet Oral BID  . simvastatin  20 mg Oral q1800  . sodium bicarbonate  650 mg Oral TID  . sodium chloride flush  3 mL Intravenous Q12H   Continuous Infusions: . sodium chloride    . cefTRIAXone (ROCEPHIN)  IV     PRN Meds: sodium chloride, acetaminophen, lactulose, ondansetron (ZOFRAN) IV, sodium chloride flush   Vital Signs    Vitals:   09/05/19 1156 09/05/19 1932 09/05/19 2130 09/06/19 0300  BP: 117/63 (!) 119/55 124/65 125/65  Pulse: 79 81 91 89  Resp: 18 15  17   Temp: 97.8 F (36.6 C) 98.3 F (36.8 C)  98.1 F (36.7 C)  TempSrc: Oral Oral  Oral  SpO2: 96% 93%  90%  Weight:    53.3 kg  Height:        Intake/Output Summary (Last 24 hours) at 09/06/2019 1031 Last data filed at 09/06/2019 0825 Gross per 24 hour  Intake 880 ml  Output 700 ml  Net 180 ml   Last 3 Weights 09/06/2019 09/05/2019 09/04/2019  Weight (lbs) 117 lb 6.4 oz 118 lb 12.8 oz 115 lb 11.2 oz  Weight (kg) 53.252 kg 53.887 kg 52.481 kg      Telemetry    NSR - Personally Reviewed  ECG    No new since 08/23/2019 - Personally Reviewed  Physical Exam   GEN: Well nourished, well developed in no acute distress HEENT: Normal, moist mucous membranes NECK: No JVD at 90 degrees CARDIAC: regular rhythm, normal S1 and S2, no rubs or gallops. 3/6 SE murmur. VASCULAR: Radial and DP pulses 2+ bilaterally. No carotid  bruits RESPIRATORY:  Clear to auscultation without rales, wheezing or rhonchi  ABDOMEN: Soft, non-tender, non-distended MUSCULOSKELETAL:  Moves all 4 limbs independently SKIN: Warm and dry, no edema NEUROLOGIC:  Alert and oriented x 3. No focal neuro deficits noted. PSYCHIATRIC:  Normal affect    Labs    High Sensitivity Troponin:   Recent Labs  Lab 08/24/2019 1220 08/24/2019 1539  TROPONINIHS 21* 26*      Chemistry Recent Labs  Lab 08/31/19 0440 Sep 20, 2019 0558 09/04/19 0728 09/05/19 0645 09/06/19 0816  NA 137   < > 133* 130* 135  K 3.2*   < > 3.7 3.6 2.8*  CL 100   < > 101 98 100  CO2 27   < > 22 18* 22  GLUCOSE 100*   < > 191* 205* 226*  BUN 20   < > 50* 64* 58*  CREATININE 0.97   < > 3.88* 4.95* 2.88*  CALCIUM 8.8*   < > 7.8* 7.9* 8.1*  ALBUMIN 3.0*  --   --   --  2.9*  GFRNONAA 53*   < > 10* 7* 14*  GFRAA >60   < > 11* 8* 16*  ANIONGAP 10   < > 10 14 13    < > =  values in this interval not displayed.     Hematology Recent Labs  Lab 09/03/19 0802 09/04/19 0728 09/06/19 0816  WBC 9.1 8.1 6.0  RBC 3.31* 3.20* 3.56*  HGB 9.8* 9.5* 10.4*  HCT 31.4* 30.1* 32.3*  MCV 94.9 94.1 90.7  MCH 29.6 29.7 29.2  MCHC 31.2 31.6 32.2  RDW 15.9* 15.6* 14.9  PLT 191 195 260    BNP No results for input(s): BNP, PROBNP in the last 168 hours.   DDimer No results for input(s): DDIMER in the last 168 hours.   Radiology    No results found.  Cardiac Studies   Cath 09/06/2019  Prox LAD to Mid LAD lesion is 90% stenosed.  Mid LAD-1 lesion is 90% stenosed.  Mid LAD-2 lesion is 90% stenosed.  Origin to Prox Graft lesion is 100% stenosed.  Ost Cx to Prox Cx lesion is 90% stenosed.  2nd Mrg lesion is 90% stenosed.  Mid Graft to Dist Graft lesion is 60% stenosed.  Prox Graft to Mid Graft lesion is 40% stenosed.  Hemodynamic findings consistent with aortic valve stenosis.  IMPRESSION: [name] has 2 of 3 grafts patent in the left dominant system.  The vein graft  to the diagonal branch is occluded at the origin.  The LIMA to the LAD is patent with significant proximal LAD disease proximal to LIMA insertion.  The vein graft to the circumflex has moderate disease in the mid and distal shaft.  The RCA is nondominant.  She has severe aortic stenosis with a valve area between 0.6 1.8 by Fick or thermal dilution and EDP of 18.  I did shoot her distal abdominal aorta revealing widely patent iliacs arteries.  I reviewed the films with Dr. Clifton James who feels that she is a acceptable candidate for TAVR.  I performed angiography of the right common femoral artery and and successfully deployed a MYNX closure device in the right common femoral artery and vein achieving hemostasis.  The patient left lab in stable condition.  Hemodynamic findings consistent with aortic valve stenosis. Right atrial pressure-5/7 Right ventricular pressure 56 systolic Pulmonary artery pressure-54/17, mean 31 Pulmonary wedge pressure-A-wave 14, V wave 15, mean 12 LVEDP-18 Strict cardiac output-4.9 L/min with an index of 3.3 L/min/m by Fick, 5.6 L/min with an index of 4.4 L/min/m by thermodilution Aortic valve area-0.81 cm by thermodilution, 0.61 cm by Fick  Echo 08/30/19 1. Left ventricular ejection fraction, by estimation, is 60 to 65%. The  left ventricle has normal function. The left ventricle has no regional  wall motion abnormalities. There is mild left ventricular hypertrophy.  Left ventricular diastolic parameters  are consistent with Grade II diastolic dysfunction (pseudonormalization).  Elevated left ventricular end-diastolic pressure.  2. Right ventricular systolic function is mildly reduced. The right  ventricular size is normal. There is severely elevated pulmonary artery  systolic pressure. The estimated right ventricular systolic pressure is  82.2 mmHg.  3. Left atrial size was mildly dilated.  4. Right atrial size was mildly dilated.  5. The mitral valve is  abnormal. Mild to moderate mitral valve  regurgitation.  6. Tricuspid valve regurgitation is mild to moderate.  7. The aortic valve is tricuspid. Aortic valve regurgitation is trivial.  Severe aortic valve stenosis. Aortic valve area, by VTI measures 0.36 cm.  Aortic valve mean gradient measures 51.0 mmHg. Aortic valve Vmax measures  4.68 m/s.  8. The inferior vena cava is dilated in size with <50% respiratory  variability, suggesting right atrial pressure of 15  mmHg.   Comparison(s): Changes from prior study are noted. 01/13/2019: LVEF 60-65%,  severe AS - mean gradient 43 mmHg.    Patient Profile     86 y.o. female with PMH of HTN, CAD (CABG X3 26 yrs ago), DM-1(on insulin pump) and severe aortic stenosis. Admitted for acute on chronic diastolic heart failure exacerbation, complicated by acute kidney injury.  Assessment & Plan    Acute on chronic diastolic heart failure, complicated by severe aortic stenosis, pulmonary hypertension, acute kidney injury -appreciate nephrology input.  -with improving Cr, improved symptoms, may be able to go to skilled nursing facility possibly as early as tomorrow from a cardiac standpoint -weight today 53.3 kg, admission weight 54.4 kg. Net negative 200cc -Cr today 2.88, down from 4.95 yesterday -spoke with TAVR team. Will plan to do procedure as an outpatient in several weeks to allow renal function to fully recover  Acute kidney injury:  -appreciate nephrology evaluation -suspected 2/2 contrast nephropathy given two CT scans and cath -Cr downtrended from 4.95 to 2.88 today -now being treated for UTI  History of CAD s/p prior CABG: -medical management with statin -on aspirin 81 mg daily  Type I diabetes, on insulin pump: -per primary team  Hypertension: BP has been running lower -holding home losartan 100 mg daily given AKI -holding home metoprolol 50 mg BID -home nifedipine 60 mg daily split to 30 mg q12hours with hold parameters per  nephrology 5/23  Hypothyroidism: -on levothyroxine  For questions or updates, please contact [place] Please consult [email or web address] for contact info under     Signed, Buford Dresser, MD  09/06/2019, 10:31 AM

## 2019-09-06 NOTE — Progress Notes (Signed)
[facility] Progress Note   Subjective:   Feeling ok this AM.  No uremic symptoms.  UOP increased through evening -- 783mL.    Objective Vitals:   09/05/19 1156 09/05/19 1932 09/05/19 2130 09/06/19 0300  BP: 117/63 (!) 119/55 124/65 125/65  Pulse: 79 81 91 89  Resp: 18 15  17   Temp: 97.8 F (36.6 C) 98.3 F (36.8 C)  98.1 F (36.7 C)  TempSrc: Oral Oral  Oral  SpO2: 96% 93%  90%  Weight:    53.3 kg  Height:       Physical Exam General: elderly woman comfortable at 45 degrees Heart: IV/VI syst murmur LUSB Lungs: normal WOB lying flat, a few basilar rales, not worse than yesterday Abdomen: soft nontender Extremities: no LE edema Neuro: nonfocal, conversant, no asterixis.   Additional Objective Labs: Basic Metabolic Panel: Recent Labs  Lab 08/31/19 0440 08/25/2019 0558 09/03/19 0802 09/03/19 0802 09/03/19 1058 09/04/19 0728 09/05/19 0645  NA 137   < > 135  --   --  133* 130*  K 3.2*   < > 4.2  --   --  3.7 3.6  CL 100   < > 99  --   --  101 98  CO2 27   < > 22  --   --  22 18*  GLUCOSE 100*   < > 144*  --   --  191* 205*  BUN 20   < > 40*  --   --  50* 64*  CREATININE 0.97   < > 2.38*   < > 2.69* 3.88* 4.95*  CALCIUM 8.8*   < > 8.3*  --   --  7.8* 7.9*  PHOS 3.9  --   --   --   --   --   --    < > = values in this interval not displayed.   Liver Function Tests: Recent Labs  Lab 08/31/19 0440  ALBUMIN 3.0*   No results for input(s): LIPASE, AMYLASE in the last 168 hours. CBC: Recent Labs  Lab 08/31/19 0440 08/31/19 0440 09/08/2019 0558 08/20/2019 1657 09/02/19 0426 09/03/19 0802 09/04/19 0728  WBC 5.3   < > 9.0  --  9.4 9.1 8.1  HGB 10.8*   < > 11.4*   < > 10.7* 9.8* 9.5*  HCT 34.1*   < > 36.1   < > 33.1* 31.4* 30.1*  MCV 94.2  --  94.8  --  95.1 94.9 94.1  PLT 222   < > 251  --  224 191 195   < > = values in this interval not displayed.   Blood Culture    Component Value Date/Time   SDES URINE, RANDOM 09/05/2019 1140   SPECREQUEST  NONE 09/05/2019 1140   CULT (A) 09/05/2019 1140    >=100,000 COLONIES/mL GRAM NEGATIVE RODS SUSCEPTIBILITIES TO FOLLOW Performed at [facility] [address], [place], [place] [address]    REPTSTATUS PENDING 09/05/2019 1140    Cardiac Enzymes: No results for input(s): CKTOTAL, CKMB, CKMBINDEX, TROPONINI in the last 168 hours. CBG: Recent Labs  Lab 09/05/19 1110 09/05/19 1622 09/05/19 2107 09/06/19 0253 09/06/19 0622  GLUCAP 279* 237* 218* 245* 261*   Iron Studies: No results for input(s): IRON, TIBC, TRANSFERRIN, FERRITIN in the last 72 hours. @lablastinr3 @ Studies/Results: No results found. Medications: . sodium chloride     . aspirin  81 mg Oral Daily  . heparin injection (subcutaneous)  5,000 Units Subcutaneous  Q8H  . insulin pump   Subcutaneous TID WC, HS, 0200  . levothyroxine  50 mcg Oral QAC breakfast  . NIFEdipine  30 mg Oral Q12H  . senna-docusate  1 tablet Oral BID  . simvastatin  20 mg Oral q1800  . sodium bicarbonate  650 mg Oral TID  . sodium chloride flush  3 mL Intravenous Q12H    Assessment/ Plan: 1. AKI, severe - most likely contrast nephropathy (sp CT x 2) w/ ARB effect also possible . Creat 0.8 at baseline on admission, trending up to 4.95 yesterday and improved significantly this AM to 2.88.  Improved UOP s/p lasix 80 IV yesterday + renal recovery.  Watch through the day today and give lasix PRN, but hopefully won't need it.   2. Severe AS - w/u in progress for TAVR 3. HTN - losartan dc'd 5/22, metop 5/23. BP's are still soft,  procardia xl 30 bid for now w/ hold orders. Avoid hypotension particularly w/ AKI.  4.  CAD h/o CABG remote 5.  Chron diast CHF: stable at this time 6. Metabolic acidosis: secondary to AKI; na bicarb 650 BID po, will likely be able to d/c tomorrow. 7. Hyponatremia, hypervolemic: resolved 8. Hypokalemia:  repleted 20 x 2 today, may need more; labs in AM.   Estill Bakes MD 09/06/2019, 9:04 AM  [facility] Pager: [phone]

## 2019-09-06 NOTE — Plan of Care (Signed)
  Problem: Education: Goal: Knowledge of General Education information will improve Description Including pain rating scale, medication(s)/side effects and non-pharmacologic comfort measures Outcome: Progressing   Problem: Health Behavior/Discharge Planning: Goal: Ability to manage health-related needs will improve Outcome: Progressing   

## 2019-09-06 NOTE — Progress Notes (Signed)
09/06/2019 PT TAVR Pre-Assessment  HPI: Pt is an 86 y/o female admitted secondary to CHF exacerbation. Was also found to have severe aortic stenosis and is being assessed for TAVR. Pt for heart cath on 08/14/2019. PMH includes HTN, CAD s/p CABG, CHF, and DM.   Clinical Impression Statement: Pt is a 86 y.o.female being assessed for pre-TAVR.  Pt reports symptoms of fatigue with activity.  Pt has 4/5 gross strength, full ROM, and fair balance.  Pt ambulated 237.7 ft during the 6 minute walk test requiring 1 rest breaks with max HR of 107, lowest O2 sat 98, BP 114/59 during mobility.  5 meter walk test produced an average gait speed of 0.96 which indicates high fall risk.  RPE was 9 and dyspnea was 3 during mobility.  Pt was limited by fatigue.  Pt's frailty rating was 4 which is considered vulnerable.  Pt would benefit from continued PT in the acute care setting due to the above listed deficits in balance, strength, ROM, endurance and activity tolerance.    General UE/LE Strength and ROM:  Strength (0-5/5) ROM (limited/full)  R UE  4/5 full  L UE 4/5 full  R LE 4/5 full  L LE 4/5 full    6 Minute Walk Test:   Total Distance Walked:237.7 ft.    Did the pt need a rest break? Yes If yes, why? Pain:No; Fatigue:Yes; Dyspnea/O2 saturations: No Comments: Pt required 1 standing rest during gait secondary to fatigue. Slower gait speed noted.    Pre-Test Post-Test  BP 113/48  114/59  HR 86 102  O2 saturations (indicated RA or L/min [place]) 98% on RA 100%  Modified Borg Dyspnea Scale (0 none-10 maximal) 1 3  RPE (6 very light-10 very hard) 7 9  Comments: VSS throughout 6 min walk test.   5 Meter Walk Test:  Trial 1 16.75 seconds  Trial 2 18.1 seconds  Trial 3 16.3 seconds  3 Trial Average/Gait Speed 17.05 seconds/0.96 ft/sec (<1.8 ft/sec indicates high fall risk)  Comments: Gait speed at 0.96 ft/sec indicating high fall risk.   Clinical Frailty Scale (1 very fit - 9 terminally ill): 4 (</= 5/12 is  considered frail)   Nancy Mcguire, PT, DPT  Acute Rehabilitation Services  Pager: [phone] Office: [phone]

## 2019-09-06 NOTE — Progress Notes (Signed)
  Mobility Specialist Criteria Algorithm Info.  Mobility Team: [facility] elevated:Self regulated Activity: Ambulated in hall (In recliner chair before and after ambulation) Range of motion: Active;All extremities Level of assistance: Standby assist, set-up cues, supervision of patient - no hands on Assistive device: Front wheel walker Minutes sitting in chair:  Minutes stood: 3 minutes Minutes ambulated: 3 minutes Distance ambulated (ft): 100 ft Mobility response: Tolerated well Bed Position: Chair (Recliner)    09/06/2019 2:16 PM

## 2019-09-06 NOTE — Progress Notes (Signed)
PROGRESS NOTE    Nancy Mcguire  ZYY:[id number] DOB: August 12, 1929 DOA: 09/04/2019 PCP: Robyne Peers, MD   Brief Narrative: 86 year old with past medical history significant for diastolic heart failure not on diuretics, moderate to severe pulmonary hypertension, severe aortic stenosis, heart murmur, CAD, carotid artery stenosis, CKD stage III AAA, diabetes type 2 on insulin pump, hypothyroidism, chronic back pain who presented with worsening shortness of breath and dry cough. Patient was admitted with acute on chronic diastolic heart failure exacerbation, chest x-ray showed cardiomegaly and small bilateral pleural effusion, CTA negative for PE, BNP 1024.  Patient acute heart failure exacerbation was treated with IV Laxis, subsequently she was transitioned to oral Lasix.  She underwent evaluation for aortic valve replacement (TAVAR), cardiac cath and CT chest with contrast.  Hospital course complicated by acute renal failure secondary to contrast nephropathy.  Patient received IV fluid transiently.  Creatinine continued to increase, nephrology consulted.  Today patient kidney function slightly improved, creatinine decreased from 4.8-2.8. Cardiology planning TAVAR  in 2 weeks (June 8).    Assessment & Plan:   Active Problems:   Hyperlipidemia   Essential hypertension   Coronary artery disease   Diabetes type 1, controlled (HCC)   Cardiac murmur   Volume overload   Severe aortic stenosis   Acute diastolic heart failure (HCC)   Aortic atherosclerosis (HCC)  1-Chronic diastolic heart failure exacerbation, severe pulmonary hypertension: Echo with ejection fraction 60 to 65%, severe aortic valve stenosis Receive IV Lasix 40 mg IV twice daily initially.  Weight 120---112---111--112--118--117 Cardiology consulted and following.  Due to AKI, lasix was on hold. Nephrology planing to give one time dose of lasix (5/24) due to hypoxemia.  Stable  2-Aortic Valve Severe Stenosis: Cardiology  consulted Cardiology has consulted TAVR team for further evaluation. Had Cath: LIMA and Circumflex graft open, diagonal graft with obstruction.  Underwent  CT scans, CVTS sx consult , TVAR will be delayed until June 8 due to AKI   Acute on CKD stage III a; related to contrast.  Lasix and ACE on hold.  Cr up to 2.4--3.6--4--2.8 Oliguric.  Renal US; hydronephrosis Received IV fluids.  We will hold further IV hydration due to crackles and mild hypoxemia. Nephrology has been consulted. Recommend lasix one time dose (5-24) Renal function is started to improve.  Urine output increased to 700  UTI: UA growing gram-negative rods.  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 Denies chest pain  Controlled diabetes type 1 with hyperglycemia: Continue insulin pump CBG increasing. Appreciate Diabetes educator follow up.  Plan to discontinue insulin pump and proceed with Lantus, meal coverage and a sliding scale insulin  Essential hypertension: Continue with losartan and metoprolol.  Hypothyroidism continue with Synthroid.  Pulmonary HTN; cardiology consulted.  Hypokalemia: Replete orally Hypomagnesemia: Replete. Constipation: started  senna, lactulose.  Had a bowel movement  Estimated body mass index is 21.47 kg/m as calculated from the following:   Height as of this encounter: 5\' 2"  (1.575 m).   Weight as of this encounter: 53.3 kg.   DVT prophylaxis: Lovenox Code Status: DNR DNI Family Communication: Husband at bedside.  Disposition Plan:  Status is: Inpatient  AWaiting cardiology evaluation for aortic valvular stenosis, transition from IV to oral Lasix  Dispo: The patient is from: Home              Anticipated d/c is to: Home  Anticipated d/c date is: 2 or 3 days              Patient currently; not medical stable for discharge        Consultants:   Cardiology  Procedures:    Echo  Antimicrobials:  None  Subjective: She started to make urine.  No new complaints.  Denies shortness of breath  Objective: Vitals:   09/05/19 1156 09/05/19 1932 09/05/19 2130 09/06/19 0300  BP: 117/63 (!) 119/55 124/65 125/65  Pulse: 79 81 91 89  Resp: 18 15  17   Temp: 97.8 F (36.6 C) 98.3 F (36.8 C)  98.1 F (36.7 C)  TempSrc: Oral Oral  Oral  SpO2: 96% 93%  90%  Weight:    53.3 kg  Height:        Intake/Output Summary (Last 24 hours) at 09/06/2019 0950 Last data filed at 09/06/2019 0825 Gross per 24 hour  Intake 880 ml  Output 700 ml  Net 180 ml   Filed Weights   09/04/19 0333 09/05/19 0331 09/06/19 0300  Weight: 52.5 kg 53.9 kg 53.3 kg    Examination:  General exam: NAD Respiratory system: Bilateral crackles at the bases Cardiovascular system: S1, S2 regular rhythm or rate systolic murmur Gastrointestinal system: Bowel sounds present, soft nontender not distended Central nervous system: Alert Extremities: No edema   Data Reviewed: I have personally reviewed following labs and imaging studies  CBC: Recent Labs  Lab 09/09/2019 0558 09/11/2019 1657 08/22/2019 1702 09/02/19 0426 09/03/19 0802 09/04/19 0728 09/06/19 0816  WBC 9.0  --   --  9.4 9.1 8.1 6.0  HGB 11.4*   < > 10.2* 10.7* 9.8* 9.5* 10.4*  HCT 36.1   < > 30.0* 33.1* 31.4* 30.1* 32.3*  MCV 94.8  --   --  95.1 94.9 94.1 90.7  PLT 251  --   --  224 191 195 260   < > = values in this interval not displayed.   Basic Metabolic Panel: Recent Labs  Lab 08/31/19 0440 08/15/2019 0558 09/02/19 0426 09/02/19 0426 09/03/19 0802 09/03/19 1058 09/04/19 0728 09/05/19 0645 09/06/19 0816  NA 137   < > 136  --  135  --  133* 130* 135  K 3.2*   < > 3.5  --  4.2  --  3.7 3.6 2.8*  CL 100   < > 101  --  99  --  101 98 100  CO2 27   < > 21*  --  22  --  22 18* 22  GLUCOSE 100*   < > 257*  --  144*  --  191* 205* 226*  BUN 20   < > 22  --  40*  --  50* 64* 58*  CREATININE 0.97   < > 0.99   < >  2.38* 2.69* 3.88* 4.95* 2.88*  CALCIUM 8.8*   < > 8.5*  --  8.3*  --  7.8* 7.9* 8.1*  MG 1.5*  --   --   --   --   --   --   --   --   PHOS 3.9  --   --   --   --   --   --   --  2.9   < > = values in this interval not displayed.   GFR: Estimated Creatinine Clearance: 10.9 mL/min (A) (by C-G formula based on SCr of 2.88 mg/dL (H)). Liver Function Tests: Recent Labs  Lab 08/31/19 0440  09/06/19 0816  ALBUMIN 3.0* 2.9*   No results for input(s): LIPASE, AMYLASE in the last 168 hours. No results for input(s): AMMONIA in the last 168 hours. Coagulation Profile: No results for input(s): INR, PROTIME in the last 168 hours. Cardiac Enzymes: No results for input(s): CKTOTAL, CKMB, CKMBINDEX, TROPONINI in the last 168 hours. BNP (last 3 results) No results for input(s): PROBNP in the last 8760 hours. HbA1C: No results for input(s): HGBA1C in the last 72 hours. CBG: Recent Labs  Lab 09/05/19 1110 09/05/19 1622 09/05/19 2107 09/06/19 0253 09/06/19 0622  GLUCAP 279* 237* 218* 245* 261*   Lipid Profile: No results for input(s): CHOL, HDL, LDLCALC, TRIG, CHOLHDL, LDLDIRECT in the last 72 hours. Thyroid Function Tests: No results for input(s): TSH, T4TOTAL, FREET4, T3FREE, THYROIDAB in the last 72 hours. Anemia Panel: No results for input(s): VITAMINB12, FOLATE, FERRITIN, TIBC, IRON, RETICCTPCT in the last 72 hours. Sepsis Labs: No results for input(s): PROCALCITON, LATICACIDVEN in the last 168 hours.  Recent Results (from the past 240 hour(s))  SARS Coronavirus 2 by RT PCR (hospital order, performed in [facility] hospital lab) Nasopharyngeal Nasopharyngeal Swab     Status: None   Collection Time: 08/17/2019  7:56 PM   Specimen: Nasopharyngeal Swab  Result Value Ref Range Status   SARS Coronavirus 2 NEGATIVE NEGATIVE Final    Comment: (NOTE) SARS-CoV-2 target nucleic acids are NOT DETECTED. The SARS-CoV-2 RNA is generally detectable in upper and lower respiratory specimens during  the acute phase of infection. The lowest concentration of SARS-CoV-2 viral copies this assay can detect is 250 copies / mL. A negative result does not preclude SARS-CoV-2 infection and should not be used as the sole basis for treatment or other patient management decisions.  A negative result may occur with improper specimen collection / handling, submission of specimen other than nasopharyngeal swab, presence of viral mutation(s) within the areas targeted by this assay, and inadequate number of viral copies (<250 copies / mL). A negative result must be combined with clinical observations, patient history, and epidemiological information. Fact Sheet for Patients:   BoilerBrush.com.cy Fact Sheet for Healthcare Providers: https://pope.com/ This test is not yet approved or cleared  by the [place] FDA and has been authorized for detection and/or diagnosis of SARS-CoV-2 by FDA under an Emergency Use Authorization (EUA).  This EUA will remain in effect (meaning this test can be used) for the duration of the COVID-19 declaration under Section 564(b)(1) of the Act, 21 U.S.C. section 360bbb-3(b)(1), unless the authorization is terminated or revoked sooner. Performed at [facility], [address] [place], [place] [address]   Urine Culture     Status: Abnormal (Preliminary result)   Collection Time: 09/05/19 11:40 AM   Specimen: Urine, Random  Result Value Ref Range Status   Specimen Description URINE, RANDOM  Final   Special Requests NONE  Final   Culture (A)  Final    >=100,000 COLONIES/mL GRAM NEGATIVE RODS SUSCEPTIBILITIES TO FOLLOW Performed at [facility], [address] [place], [place] [address]    Report Status PENDING  Incomplete         Radiology Studies: No results found.      Scheduled Meds: . aspirin  81 mg Oral Daily  . heparin injection (subcutaneous)  5,000 Units Subcutaneous Q8H  . insulin  pump   Subcutaneous TID WC, HS, 0200  . levothyroxine  50 mcg Oral QAC breakfast  . NIFEdipine  30 mg Oral Q12H  .  potassium chloride  20 mEq Oral Once  . senna-docusate  1 tablet Oral BID  . simvastatin  20 mg Oral q1800  . sodium bicarbonate  650 mg Oral TID  . sodium chloride flush  3 mL Intravenous Q12H   Continuous Infusions: . sodium chloride    . cefTRIAXone (ROCEPHIN)  IV       LOS: 8 days    Time spent: 35 minutes    Sierra Bissonette A Nikolai Wilczak, MD [facility]   If 7PM-7AM, please contact night-coverage [email or web address]  09/06/2019, 9:50 AM

## 2019-09-06 NOTE — H&P (View-Only) (Signed)
[facility] TE[facility]  Inpatient TAVR Consultation:   Patient ID: Nancy Mcguire; [id number]; [birth year]   Admit date: 08/16/2019 Date of Consult: 09/06/2019  Primary Care Provider: Robyne Peers, MD Primary Cardiologist: Dr. Gwenlyn Found    Patient Profile:   Nancy Mcguire is a 86 y.o. female with a hx of HTN, HLD, CAD s/p remote CABG x3, CKD stage IIIA, DMT1 on an insulin pump (diagnosed in her 95s), pulmonary HTN and severe aortic stenosis  who is being seen today for the evaluation of severe AS at the request of Dr. Angelena Form.  History of Present Illness:   Nancy Mcguire  lives with her husband in [place], [place]. She has three daughters, one who lives locally. She functions independently and stays very busy.  She no longer drives, but goes to the grocery store and takes care of all of her own ADLs.  She used to use her Rollator to walk around the neighborhood for exercise but has not been able to do that for several months given worsening shortness of breath and fatigue.  She has also limited by back pain.  Of note, she regularly sees the dentist and has had no current dental issues.  She underwent CABG x3 in [place] 26years ago and has not had recurrent issues since. She and her husband moved from [place] to [place] 6 years ago. She was last seen by Dr. Gwenlyn Found on 01/04/2019 and was completely asymptomatic at that time. She underwent carotid Dopplers at [facility] on 09/21/2018 which showed 1-39% stenosis bilaterally.  Echo 01/13/19 showed EF 60-65%, mild MR, mild-mod TR, severe AS with a mean gradient of 43 mm hg and moderate pulmonary HTN. It appears her severe AS was missed at this time. She was seen in our clinic by Coletta Memos NP on 08/09/19 for evaluation of shortness of breath and chest pressure which was felt to be most consistent with chest wall pain. She was noted to have a systolic murmur at that time. Continued monitoring was  recommended.   She presented to [facility] on 09/05/2019 with worsening shortness of breath, DOE and chest pain. In the ER, CXR showed cardiomegaly, elevation of the left hemidiaphragm, small biilateral pleural effusions and or pleural thickening. CTA of chest negative for PE. There was extensive atherosclerotic disease including coronary arteries and small to moderate bilateral pleural effusions with compressive atelectasis of lower lobes. HS Troponin 21; 26, BNP 1024, HGb 10.9, WBC 5.1 plts 219, Na 135, K+ 4.9, glucose 262, BUN 25 and Cr 0.99 and COVID neg. Repeat echo 08/30/19 showed EF 60-65%, mild LVH, G2DD, elevated LVEDP, mild RV dysfunction, severely elevated pulmonary artery pressure (82.2 mm Hg), mild biatrial enlargement, mild-mod MR, severe AS with a mean gradient of 51 mm Hg and trivial AI. She has been diuersed with IV lasix with symptomatic improvement.    The multidisciplinary valve team was consulted for consideration of TAVR and she was seen by Dr. Angelena Form.  She underwent left and right heart catheterization on 5/20 which showed 2 out of 3 patent bypass grafts in a left dominant system.  The vein graft to the diagonal branch is occluded at the origin.  The LIMA to the LAD is patent with significant proximal LAD disease proximal to the LIMA insertion and the vein graft to the circumflex has moderate disease in the mid to distal shaft.  The RCA is nondominant.  She was found to have severe aortic stenosis with an AVA of  0.6cm2 and an LVEDP of 18.  On 5/21, she underwent cardiac gated CTA of the heart which revealed anatomical characteristics consistent with severe aortic stenosis suitable for treatment by transcatheter aortic valve replacement without any significant complicating feature.  CTA of the aorta and iliac vessels demonstrate what appear to be adequate pelvic vascular access to facilitate a transfemoral approach (potentially on left side).  Unfortunately, the patient developed acute kidney  injury with a creat peaking at 4.95 felt to mostly likely be related to contrast induced nephropathy with possible ARB effect. Losartan was discontinued on 5/22. She was treated with bicarb given metabolic acidosis from renal failure. Her creat has now improved to 2.88 and she has had increased urine output with 80 mg IV lasix. She is also growing gram negative rods in her urine and being treated for a UTI.   She is currently feeling much better. Husband in room with her. Shortness of breath has improved. Anxious to get valve done but she understands that we need to be careful as the TAVR procedure requires contrast dye.   Past Medical History:  Diagnosis Date  . CAD (coronary artery disease)   . Carotid stenosis   . Chronic back pain   . CKD (chronic kidney disease) stage 3, GFR 30-59 ml/min   . Diabetes mellitus type 1 (HCC)   . Hyperlipidemia   . Hypertension   . Hypothyroidism   . Osteoporosis     Past Surgical History:  Procedure Laterality Date  . CESAREAN SECTION    . CORONARY ARTERY BYPASS GRAFT     X 3  . RIGHT/LEFT HEART CATH AND CORONARY/GRAFT ANGIOGRAPHY N/A 09/08/2019   Procedure: RIGHT/LEFT HEART CATH AND CORONARY/GRAFT ANGIOGRAPHY;  Surgeon: Runell Gess, MD;  Location: [facility] INVASIVE CV LAB;  Service: Cardiovascular;  Laterality: N/A;  . TRIGGER FINGER RELEASE       Inpatient Medications: Scheduled Meds: . aspirin  81 mg Oral Daily  . heparin injection (subcutaneous)  5,000 Units Subcutaneous Q8H  . insulin pump   Subcutaneous TID WC, HS, 0200  . levothyroxine  50 mcg Oral QAC breakfast  . NIFEdipine  30 mg Oral Q12H  . potassium chloride  20 mEq Oral Once  . senna-docusate  1 tablet Oral BID  . simvastatin  20 mg Oral q1800  . sodium bicarbonate  650 mg Oral TID  . sodium chloride flush  3 mL Intravenous Q12H   Continuous Infusions: . sodium chloride    . cefTRIAXone (ROCEPHIN)  IV 1 g (09/06/19 1139)   PRN Meds: sodium chloride, acetaminophen,  lactulose, ondansetron (ZOFRAN) IV, sodium chloride flush  Allergies:    Allergies  Allergen Reactions  . Neomycin-Bacitracin Zn-Polymyx Other (See Comments)    Eyes irritation   . Hydrocodone-Acetaminophen Nausea And Vomiting    Social History:   Social History   Socioeconomic History  . Marital status: Married    Spouse name: Not on file  . Number of children: Not on file  . Years of education: Not on file  . Highest education level: Not on file  Occupational History  . Not on file  Tobacco Use  . Smoking status: Never Smoker  . Smokeless tobacco: Never Used  Substance and Sexual Activity  . Alcohol use: Yes  . Drug use: Never  . Sexual activity: Not on file  Other Topics Concern  . Not on file  Social History Narrative  . Not on file   Social Determinants of Health   Financial Resource  Strain:   . Difficulty of Paying Living Expenses:   Food Insecurity:   . Worried About Programme researcher, broadcasting/film/video in the Last Year:   . Barista in the Last Year:   Transportation Needs:   . Freight forwarder (Medical):   Marland Kitchen Lack of Transportation (Non-Medical):   Physical Activity:   . Days of Exercise per Week:   . Minutes of Exercise per Session:   Stress:   . Feeling of Stress :   Social Connections:   . Frequency of Communication with Friends and Family:   . Frequency of Social Gatherings with Friends and Family:   . Attends Religious Services:   . Active Member of Clubs or Organizations:   . Attends Banker Meetings:   Marland Kitchen Marital Status:   Intimate Partner Violence:   . Fear of Current or Ex-Partner:   . Emotionally Abused:   Marland Kitchen Physically Abused:   . Sexually Abused:     Family History:   The patient's family history includes Healthy in her mother.  ROS:  Please see the history of present illness.  ROS  All other ROS reviewed and negative.     Physical Exam/Data:   Vitals:   09/05/19 1932 09/05/19 2130 09/06/19 0300 09/06/19 1115  BP:  (!) 119/55 124/65 125/65 138/70  Pulse: 81 91 89 86  Resp: 15  17 16   Temp: 98.3 F (36.8 C)  98.1 F (36.7 C) 97.7 F (36.5 C)  TempSrc: Oral  Oral Oral  SpO2: 93%  90% 100%  Weight:   53.3 kg   Height:        Intake/Output Summary (Last 24 hours) at 09/06/2019 1220 Last data filed at 09/06/2019 0825 Gross per 24 hour  Intake 880 ml  Output 600 ml  Net 280 ml   Filed Weights   09/04/19 0333 09/05/19 0331 09/06/19 0300  Weight: 52.5 kg 53.9 kg 53.3 kg   Body mass index is 21.47 kg/m.  General:  Well nourished, well developed, in no acute distress HEENT: normal Lymph: no adenopathy Neck: no JVD Endocrine:  No thryoid Cardiac:  normal S1, S2; RRR; 3/6 harsh SEM heard best @ LUSB Lungs:  clear to auscultation bilaterally, no wheezing, rhonchi or rales  Abd: soft, nontender, no hepatomegaly  Ext: no edema Musculoskeletal:  No deformities, BUE and BLE strength normal and equal Skin: warm and dry  Neuro:  CNs 2-12 intact, no focal abnormalities noted Psych:  Normal affect   EKG:  The EKG was personally reviewed and demonstrates:  Sinus bradycardia HR 58 Telemetry:  Telemetry was personally reviewed and demonstrates:  Sinus brady  Relevant CV Studies: Echo 08/30/19 IMPRESSIONS  1. Left ventricular ejection fraction, by estimation, is 60 to 65%. The  left ventricle has normal function. The left ventricle has no regional  wall motion abnormalities. There is mild left ventricular hypertrophy.  Left ventricular diastolic parameters  are consistent with Grade II diastolic dysfunction (pseudonormalization).  Elevated left ventricular end-diastolic pressure.  2. Right ventricular systolic function is mildly reduced. The right  ventricular size is normal. There is severely elevated pulmonary artery  systolic pressure. The estimated right ventricular systolic pressure is  82.2 mmHg.  3. Left atrial size was mildly dilated.  4. Right atrial size was mildly dilated.  5. The  mitral valve is abnormal. Mild to moderate mitral valve  regurgitation.  6. Tricuspid valve regurgitation is mild to moderate.  7. The aortic valve is tricuspid. Aortic  valve regurgitation is trivial.  Severe aortic valve stenosis. Aortic valve area, by VTI measures 0.36 cm.  Aortic valve mean gradient measures 51.0 mmHg. Aortic valve Vmax measures  4.68 m/s.  8. The inferior vena cava is dilated in size with <50% respiratory  variability, suggesting right atrial pressure of 15 mmHg.   Comparison(s): Changes from prior study are noted. 01/13/2019: LVEF 60-65%,  severe AS - mean gradient 43 mmHg.   FINDINGS  Left Ventricle: Left ventricular ejection fraction, by estimation, is 60  to 65%. The left ventricle has normal function. The left ventricle has no  regional wall motion abnormalities. The left ventricular internal cavity  size was normal in size. There is  mild left ventricular hypertrophy. Left ventricular diastolic parameters  are consistent with Grade II diastolic dysfunction (pseudonormalization).  Elevated left ventricular end-diastolic pressure.   Right Ventricle: The right ventricular size is normal. No increase in  right ventricular wall thickness. Right ventricular systolic function is  mildly reduced. There is severely elevated pulmonary artery systolic  pressure. The tricuspid regurgitant  velocity is 4.10 m/s, and with an assumed right atrial pressure of 15  mmHg, the estimated right ventricular systolic pressure is 82.2 mmHg.   Left Atrium: Left atrial size was mildly dilated.   Right Atrium: Right atrial size was mildly dilated.   Pericardium: There is no evidence of pericardial effusion.   Mitral Valve: The mitral valve is abnormal. There is moderate thickening  of the mitral valve leaflet(s). There is moderate calcification of the  posterior mitral valve leaflet(s). Moderate mitral annular calcification.  Mild to moderate mitral valve  regurgitation.    Tricuspid Valve: The tricuspid valve is grossly normal. Tricuspid valve  regurgitation is mild to moderate.   Aortic Valve: The aortic valve is tricuspid. Aortic valve regurgitation is  trivial. Severe aortic stenosis is present. Aortic valve mean gradient  measures 51.0 mmHg. Aortic valve peak gradient measures 87.6 mmHg. Aortic  valve area, by VTI measures 0.36  cm.   Pulmonic Valve: The pulmonic valve was normal in structure. Pulmonic valve  regurgitation is not visualized.   Aorta: The aortic root and ascending aorta are structurally normal, with  no evidence of dilitation.   Venous: The inferior vena cava is dilated in size with less than 50%  respiratory variability, suggesting right atrial pressure of 15 mmHg.   IAS/Shunts: No atrial level shunt detected by color flow Doppler.     LEFT VENTRICLE  PLAX 2D  LVIDd:     4.20 cm Diastology  LVIDs:     3.10 cm LV e' lateral:  6.76 cm/s  LV PW:     1.10 cm LV E/e' lateral: 18.9  LV IVS:    1.10 cm LV e' medial:  4.04 cm/s  LVOT diam:   1.80 cm LV E/e' medial: 31.7  LV SV:     41  LV SV Index:  27  LVOT Area:   2.54 cm     RIGHT VENTRICLE  RV Basal diam: 2.80 cm  RV S prime:   6.10 cm/s  TAPSE (M-mode): 2.3 cm   LEFT ATRIUM       Index    RIGHT ATRIUM      Index  LA diam:    4.50 cm 2.92 cm/m RA Area:   18.80 cm  LA Vol (A2C):  52.2 ml 33.93 ml/m RA Volume:  54.70 ml 35.55 ml/m  LA Vol (A4C):  45.5 ml 29.57 ml/m  LA Biplane Vol: 48.4  ml 31.46 ml/m  AORTIC VALVE  AV Area (Vmax):  0.39 cm  AV Area (Vmean):  0.34 cm  AV Area (VTI):   0.36 cm  AV Vmax:      468.00 cm/s  AV Vmean:     317.333 cm/s  AV VTI:      1.147 m  AV Peak Grad:   87.6 mmHg  AV Mean Grad:   51.0 mmHg  LVOT Vmax:     71.70 cm/s  LVOT Vmean:    42.800 cm/s  LVOT VTI:     0.163 m  LVOT/AV VTI ratio: 0.14    AORTA  Ao Root diam:  2.60 cm   MITRAL VALVE        TRICUSPID VALVE  MV Area (PHT): 5.20 cm   TR Peak grad:  67.2 mmHg  MV Decel Time: 146 msec   TR Vmax:    410.00 cm/s  MV E velocity: 128.00 cm/s  MV A velocity: 111.00 cm/s SHUNTS  MV E/A ratio: 1.15     Systemic VTI: 0.16 m  Systemic Diam: 1.80 cm    __________________  Carotid dopplers 09/03/2019 Summary:  Right Carotid: Velocities in the right ICA are consistent with a 1-39%  stenosis.   Left Carotid: Velocities in the left ICA are consistent with a 1-39%  stenosis.   Vertebrals: Bilateral vertebral arteries demonstrate antegrade flow.  Subclavians: Normal flow hemodynamics were seen in bilateral subclavian        arteries.   __________________  [facility] 09/30/2019 RIGHT/LEFT HEART CATH AND CORONARY/GRAFT ANGIOGRAPHY  Conclusion    Prox LAD to Mid LAD lesion is 90% stenosed.  Mid LAD-1 lesion is 90% stenosed.  Mid LAD-2 lesion is 90% stenosed.  Origin to Prox Graft lesion is 100% stenosed.  Ost Cx to Prox Cx lesion is 90% stenosed.  2nd Mrg lesion is 90% stenosed.  Mid Graft to Dist Graft lesion is 60% stenosed.  Prox Graft to Mid Graft lesion is 40% stenosed.  Hemodynamic findings consistent with aortic valve stenosis.   Nancy Mcguire is a 86 y.o. female    [id number] LOCATION:  FACILITY: [facility]  PHYSICIAN: Nanetta Batty, M.D. [birth year]   DATE OF PROCEDURE:  Sep 30, 2019  DATE OF DISCHARGE:     CARDIAC CATHETERIZATION     History obtained from chart review.86 y.o.femalewith a hx of HTN, CAD (CABG X3 26 yrs ago), DM-1(on insulin pump) and AS with echo 01/2019 with AVarea by VTI of 0.51who is being seen today for the evaluation of SOB and severe AS.   IMPRESSION: Ms. Steinhauser has 2 of 3 grafts patent in the left dominant system.  The vein graft to the diagonal branch is occluded at the origin.  The LIMA to the LAD is patent with significant proximal LAD disease proximal  to LIMA insertion.  The vein graft to the circumflex has moderate disease in the mid and distal shaft.  The RCA is nondominant.  She has severe aortic stenosis with a valve area between 0.6 1.8 by Fick or thermal dilution and EDP of 18.  I did shoot her distal abdominal aorta revealing widely patent iliacs arteries.  I reviewed the films with Dr. Clifton James who feels that she is a acceptable candidate for TAVR.  I performed angiography of the right common femoral artery and and successfully deployed a MYNX closure device in the right common femoral artery and vein achieving hemostasis.  The patient left lab in stable condition.   __________________   Cardiac CT 09/02/19  ADDENDUM: Extracardiac findings are described separately under dictation for contemporaneously obtained CTA chest, abdomen and pelvis 07/03/2019. Please see that dictation for full description of relevant extracardiac findings.   Electronically Signed   By: Trudie Reed M.D.   On: 09/05/2019 11:33   Addended by Florencia Reasons, MD on 09/05/2019 11:36 AM    Study Result  CLINICAL DATA:  Aortic Stenosis  EXAM: Cardiac TAVR CT  TECHNIQUE: The patient was scanned on a Siemens Force 192 slice scanner. A 120 kV retrospective scan was triggered in the ascending thoracic aorta at 140 HU's. Gantry rotation speed was 250 msecs and collimation was .6 mm. No beta blockade or nitro were given. The 3D data set was reconstructed in 5% intervals of the R-R cycle. Systolic and diastolic phases were analyzed on a dedicated work station using MPR, MIP and VRT modes. The patient received 80 cc of contrast.  FINDINGS: Aortic Valve: Tri leaflet calcified with restricted leaflet motion  Aorta: No aneurysm normal arch vessels moderate calcific atherosclerosis  Sino-tubular Junction: 23 mm  Ascending Thoracic Aorta: 26 mm  Aortic Arch: 26 mm  Descending Thoracic Aorta: 19 mm  Sinus of Valsalva  Measurements:  Non-coronary: 29 mm  Right - coronary: 28.4 mm  Left -   coronary: 28.3 mm  Coronary Artery Height above Annulus:  Left Main: 13.1 mm above annulus  Right Coronary: 14.4 mm above annulus  Virtual Basal Annulus Measurements:  Maximum / Minimum Diameter: 25.6 mm x 20.65 mm  Perimeter: 72.4 mm  Area: 388 mm2  Coronary Arteries: Sufficient height above annulus for deployment Patent SVG to OM and Patent LIMA to LAD  Optimum Fluoroscopic Angle for Delivery: LAO 14 Caudal 14 degrees  IMPRESSION: 1. Tri leaflet AV with annular area of 388 mm2 suitable for a 23 mm Sapien 3 valve  2. Coronary arteries suitable height above annulus for deployment Patent SVG to OM and LIMA to LAD  3.  Normal ascending aortic root diameter 2.6 cm  4. Optimum angiographic angle for deployment LAO 14 Caudal 14 degrees  Nancy Mcguire     __________________  CT angio chest/abd/pelvis 09/02/19 Study Result  CLINICAL DATA:  86 year old female with history of severe aortic stenosis. Preprocedural study prior to potential transcatheter aortic valve replacement (TAVR).  EXAM: CT ANGIOGRAPHY CHEST, ABDOMEN AND PELVIS  TECHNIQUE: Non-contrast CT of the chest was initially obtained.  Multidetector CT imaging through the chest, abdomen and pelvis was performed using the standard protocol during bolus administration of intravenous contrast. Multiplanar reconstructed images and MIPs were obtained and reviewed to evaluate the vascular anatomy.  CONTRAST:  80mL OMNIPAQUE IOHEXOL 350 MG/ML SOLN  COMPARISON:  No priors.  FINDINGS: CTA CHEST FINDINGS  Cardiovascular: Heart size is enlarged with biatrial dilatation (right greater than left). There is no significant pericardial fluid, thickening or pericardial calcification. There is aortic atherosclerosis, as well as atherosclerosis of the great vessels of the mediastinum and the coronary arteries, including  calcified atherosclerotic plaque in the left main, left anterior descending and left circumflex coronary arteries. Status post median sternotomy for CABG including LIMA to the LAD. Severe thickening calcification of the aortic valve. Moderate to severe calcifications of the mitral annulus, particularly inferiorly.  Mediastinum/Lymph Nodes: No pathologically enlarged mediastinal or hilar lymph nodes. In the mid esophagus (axial image 75 of series 15) there is a well-defined 1.0 x 0.7 cm high attenuation structure, which could represent an esophageal varix, or could represent a duplication cyst containing proteinaceous/calcific content. No axillary  lymphadenopathy.  Lungs/Pleura: A few scattered small pulmonary nodules are noted, largest of which measures only 5 mm in the posterior aspect of the right upper lobe (axial image 32 of series 16). No other larger more suspicious appearing pulmonary nodules or masses are noted. Bilateral apical pleuroparenchymal thickening and architectural distortion (left greater than right), most compatible with chronic post infectious or inflammatory scarring. Linear scarring also noted in the basal segments of the left lower lobe. Trace bilateral pleural effusions lying dependently.  Musculoskeletal/Soft Tissues: There are no aggressive appearing lytic or blastic lesions noted in the visualized portions of the skeleton. Median sternotomy wires.  CTA ABDOMEN AND PELVIS FINDINGS  Hepatobiliary: No suspicious cystic or solid hepatic lesions. No intra or extrahepatic biliary ductal dilatation. Gallbladder is unremarkable in appearance.  Pancreas: No pancreatic mass. No pancreatic ductal dilatation. No pancreatic or peripancreatic fluid collections or inflammatory changes.  Spleen: Unremarkable.  Adrenals/Urinary Tract: Bilateral kidneys and adrenal glands are unremarkable in appearance. No hydroureteronephrosis. Urinary bladder is  unremarkable in appearance.  Stomach/Bowel: Normal appearance of the stomach. No pathologic dilatation of small bowel or colon. The appendix is not confidently identified and may be surgically absent. Regardless, there are no inflammatory changes noted adjacent to the cecum to suggest the presence of an acute appendicitis at this time.  Vascular/Lymphatic: Aortic atherosclerosis with vascular findings and measurements pertinent to potential TAVR procedure, as detailed below. No aneurysm or dissection noted in the abdominal or pelvic vasculature. No lymphadenopathy noted in the abdomen or pelvis.  Reproductive: Uterus and ovaries are atrophic. In the uterus there is a 2.4 cm densely calcified lesion (axial image 179 of series 15), presumably a calcified fibroid.  Other: No significant volume of ascites.  No pneumoperitoneum.  Musculoskeletal: Chronic appearing compression fracture of L2 with 30% loss of anterior vertebral body height. There are no aggressive appearing lytic or blastic lesions noted in the visualized portions of the skeleton.  VASCULAR MEASUREMENTS PERTINENT TO TAVR:  AORTA:  Minimal Aortic Diameter-9 x 11 mm  Severity of Aortic Calcification-moderate  RIGHT PELVIS:  Right Common Iliac Artery -  Minimal Diameter-6.3 x 3.7 mm  Tortuosity - mild  Calcification-moderate to severe  Right External Iliac Artery -  Minimal Diameter-5.6 x 5.8 mm  Tortuosity - mild  Calcification-mild  Right Common Femoral Artery -  Minimal Diameter-5.0 x 3.2 mm  Tortuosity - mild  Calcification-moderate  LEFT PELVIS:  Left Common Iliac Artery -  Minimal Diameter-5.8 x 6.7 mm  Tortuosity - mild  Calcification-moderate  Left External Iliac Artery -  Minimal Diameter-6.7 x 6.1 mm  Tortuosity - mild  Calcification-mild  Left Common Femoral Artery -  Minimal Diameter-5.6 x 5.0 mm  Tortuosity-mild  Calcification-mild  to moderate  Review of the MIP images confirms the above findings.  IMPRESSION: 1. Vascular findings and measurements pertinent to potential TAVR procedure, as detailed above. 2. Severe thickening calcification of the aortic valve, compatible with the reported clinical history of severe aortic stenosis. 3. 1.0 x 0.7 cm high attenuation structure in the mid esophagus which may represent a large esophageal varix, or could simply represent a duplication cyst containing proteinaceous or calcific content. 4. Small pulmonary nodules in the lungs bilaterally measuring 5 mm or less in size, nonspecific, but statistically likely benign. No follow-up needed if patient is low-risk (and has no known or suspected primary neoplasm). Non-contrast chest CT can be considered in 12 months if patient is high-risk. This recommendation follows the consensus statement: Guidelines for Management of  Incidental Pulmonary Nodules Detected on CT Images: From the Fleischner Society 2017; Radiology 2017; 284:228-243. 5. Cardiomegaly with biatrial dilatation (right greater than left). 6. Aortic atherosclerosis, in addition to left main and 2 vessel coronary artery disease. Status post median sternotomy for CABG including LIMA to the LAD. 7. Additional incidental findings, as above.     Laboratory Data:  Chemistry Recent Labs  Lab 09/04/19 0728 09/05/19 0645 09/06/19 0816  NA 133* 130* 135  K 3.7 3.6 2.8*  CL 101 98 100  CO2 22 18* 22  GLUCOSE 191* 205* 226*  BUN 50* 64* 58*  CREATININE 3.88* 4.95* 2.88*  CALCIUM 7.8* 7.9* 8.1*  GFRNONAA 10* 7* 14*  GFRAA 11* 8* 16*  ANIONGAP 10 14 13     Recent Labs  Lab 08/31/19 0440 09/06/19 0816  ALBUMIN 3.0* 2.9*   Hematology Recent Labs  Lab 09/04/19 0802 09/04/19 0728 09/06/19 0816  WBC 9.1 8.1 6.0  RBC 3.31* 3.20* 3.56*  HGB 9.8* 9.5* 10.4*  HCT 31.4* 30.1* 32.3*  MCV 94.9 94.1 90.7  MCH 29.6 29.7 29.2  MCHC 31.2 31.6 32.2  RDW 15.9*  15.6* 14.9  PLT 191 195 260   Cardiac EnzymesNo results for input(s): TROPONINI in the last 168 hours. No results for input(s): TROPIPOC in the last 168 hours.  BNPNo results for input(s): BNP, PROBNP in the last 168 hours.  DDimer No results for input(s): DDIMER in the last 168 hours.  Radiology/Studies:  US RENAL  Result Date: 2019-09-04 CLINICAL DATA:  86 year female with acute kidney injury EXAM: RENAL / URINARY TRACT ULTRASOUND COMPLETE COMPARISON:  None. FINDINGS: Right Kidney: Length: 11.3 cm x 4.2 cm x 4.7 cm, 97 cc. Echogenicity within normal limits. No mass or hydronephrosis visualized. Left Kidney: Length: 9.0 cm x 5.0 cm x 4.3 cm 102 cc. Echogenicity within normal limits. No mass or hydronephrosis visualized. Bladder: Appears normal for degree of bladder distention. IMPRESSION: Negative for hydronephrosis of the left or right kidney. Electronically Signed   By: Gilmer Mor D.O.   On: 09-04-2019 10:49   CT CORONARY MORPH W/CTA COR W/SCORE W/CA W/CM &/OR WO/CM  Addendum Date: 09/05/2019   ADDENDUM REPORT: 09/05/2019 11:33 ADDENDUM: Extracardiac findings are described separately under dictation for contemporaneously obtained CTA chest, abdomen and pelvis 07/03/2019. Please see that dictation for full description of relevant extracardiac findings. Electronically Signed   By: Trudie Reed M.D.   On: 09/05/2019 11:33   Result Date: 09/05/2019 CLINICAL DATA:  Aortic Stenosis EXAM: Cardiac TAVR CT TECHNIQUE: The patient was scanned on a Siemens Force 192 slice scanner. A 120 kV retrospective scan was triggered in the ascending thoracic aorta at 140 HU's. Gantry rotation speed was 250 msecs and collimation was .6 mm. No beta blockade or nitro were given. The 3D data set was reconstructed in 5% intervals of the R-R cycle. Systolic and diastolic phases were analyzed on a dedicated work station using MPR, MIP and VRT modes. The patient received 80 cc of contrast. FINDINGS: Aortic  Valve: Tri leaflet calcified with restricted leaflet motion Aorta: No aneurysm normal arch vessels moderate calcific atherosclerosis Sino-tubular Junction: 23 mm Ascending Thoracic Aorta: 26 mm Aortic Arch: 26 mm Descending Thoracic Aorta: 19 mm Sinus of Valsalva Measurements: Non-coronary: 29 mm Right - coronary: 28.4 mm Left -   coronary: 28.3 mm Coronary Artery Height above Annulus: Left Main: 13.1 mm above annulus Right Coronary: 14.4 mm above annulus Virtual Basal Annulus Measurements: Maximum / Minimum Diameter: 25.6 mm x  20.65 mm Perimeter: 72.4 mm Area: 388 mm2 Coronary Arteries: Sufficient height above annulus for deployment Patent SVG to OM and Patent LIMA to LAD Optimum Fluoroscopic Angle for Delivery: LAO 14 Caudal 14 degrees IMPRESSION: 1. Tri leaflet AV with annular area of 388 mm2 suitable for a 23 mm Sapien 3 valve 2. Coronary arteries suitable height above annulus for deployment Patent SVG to OM and LIMA to LAD 3.  Normal ascending aortic root diameter 2.6 cm 4. Optimum angiographic angle for deployment LAO 14 Caudal 14 degrees Nancy Mcguire Electronically Signed: By: Nancy Mcguire M.D. On: 09/02/2019 17:15   CT ANGIO CHEST AORTA W/CM & OR WO/CM  Result Date: 09/05/2019 CLINICAL DATA:  86 year old female with history of severe aortic stenosis. Preprocedural study prior to potential transcatheter aortic valve replacement (TAVR). EXAM: CT ANGIOGRAPHY CHEST, ABDOMEN AND PELVIS TECHNIQUE: Non-contrast CT of the chest was initially obtained. Multidetector CT imaging through the chest, abdomen and pelvis was performed using the standard protocol during bolus administration of intravenous contrast. Multiplanar reconstructed images and MIPs were obtained and reviewed to evaluate the vascular anatomy. CONTRAST:  45mL OMNIPAQUE IOHEXOL 350 MG/ML SOLN COMPARISON:  No priors. FINDINGS: CTA CHEST FINDINGS Cardiovascular: Heart size is enlarged with biatrial dilatation (right greater than left). There is no  significant pericardial fluid, thickening or pericardial calcification. There is aortic atherosclerosis, as well as atherosclerosis of the great vessels of the mediastinum and the coronary arteries, including calcified atherosclerotic plaque in the left main, left anterior descending and left circumflex coronary arteries. Status post median sternotomy for CABG including LIMA to the LAD. Severe thickening calcification of the aortic valve. Moderate to severe calcifications of the mitral annulus, particularly inferiorly. Mediastinum/Lymph Nodes: No pathologically enlarged mediastinal or hilar lymph nodes. In the mid esophagus (axial image 75 of series 15) there is a well-defined 1.0 x 0.7 cm high attenuation structure, which could represent an esophageal varix, or could represent a duplication cyst containing proteinaceous/calcific content. No axillary lymphadenopathy. Lungs/Pleura: A few scattered small pulmonary nodules are noted, largest of which measures only 5 mm in the posterior aspect of the right upper lobe (axial image 32 of series 16). No other larger more suspicious appearing pulmonary nodules or masses are noted. Bilateral apical pleuroparenchymal thickening and architectural distortion (left greater than right), most compatible with chronic post infectious or inflammatory scarring. Linear scarring also noted in the basal segments of the left lower lobe. Trace bilateral pleural effusions lying dependently. Musculoskeletal/Soft Tissues: There are no aggressive appearing lytic or blastic lesions noted in the visualized portions of the skeleton. Median sternotomy wires. CTA ABDOMEN AND PELVIS FINDINGS Hepatobiliary: No suspicious cystic or solid hepatic lesions. No intra or extrahepatic biliary ductal dilatation. Gallbladder is unremarkable in appearance. Pancreas: No pancreatic mass. No pancreatic ductal dilatation. No pancreatic or peripancreatic fluid collections or inflammatory changes. Spleen:  Unremarkable. Adrenals/Urinary Tract: Bilateral kidneys and adrenal glands are unremarkable in appearance. No hydroureteronephrosis. Urinary bladder is unremarkable in appearance. Stomach/Bowel: Normal appearance of the stomach. No pathologic dilatation of small bowel or colon. The appendix is not confidently identified and may be surgically absent. Regardless, there are no inflammatory changes noted adjacent to the cecum to suggest the presence of an acute appendicitis at this time. Vascular/Lymphatic: Aortic atherosclerosis with vascular findings and measurements pertinent to potential TAVR procedure, as detailed below. No aneurysm or dissection noted in the abdominal or pelvic vasculature. No lymphadenopathy noted in the abdomen or pelvis. Reproductive: Uterus and ovaries are atrophic. In the uterus there  is a 2.4 cm densely calcified lesion (axial image 179 of series 15), presumably a calcified fibroid. Other: No significant volume of ascites.  No pneumoperitoneum. Musculoskeletal: Chronic appearing compression fracture of L2 with 30% loss of anterior vertebral body height. There are no aggressive appearing lytic or blastic lesions noted in the visualized portions of the skeleton. VASCULAR MEASUREMENTS PERTINENT TO TAVR: AORTA: Minimal Aortic Diameter-9 x 11 mm Severity of Aortic Calcification-moderate RIGHT PELVIS: Right Common Iliac Artery - Minimal Diameter-6.3 x 3.7 mm Tortuosity - mild Calcification-moderate to severe Right External Iliac Artery - Minimal Diameter-5.6 x 5.8 mm Tortuosity - mild Calcification-mild Right Common Femoral Artery - Minimal Diameter-5.0 x 3.2 mm Tortuosity - mild Calcification-moderate LEFT PELVIS: Left Common Iliac Artery - Minimal Diameter-5.8 x 6.7 mm Tortuosity - mild Calcification-moderate Left External Iliac Artery - Minimal Diameter-6.7 x 6.1 mm Tortuosity - mild Calcification-mild Left Common Femoral Artery - Minimal Diameter-5.6 x 5.0 mm Tortuosity-mild Calcification-mild  to moderate Review of the MIP images confirms the above findings. IMPRESSION: 1. Vascular findings and measurements pertinent to potential TAVR procedure, as detailed above. 2. Severe thickening calcification of the aortic valve, compatible with the reported clinical history of severe aortic stenosis. 3. 1.0 x 0.7 cm high attenuation structure in the mid esophagus which may represent a large esophageal varix, or could simply represent a duplication cyst containing proteinaceous or calcific content. 4. Small pulmonary nodules in the lungs bilaterally measuring 5 mm or less in size, nonspecific, but statistically likely benign. No follow-up needed if patient is low-risk (and has no known or suspected primary neoplasm). Non-contrast chest CT can be considered in 12 months if patient is high-risk. This recommendation follows the consensus statement: Guidelines for Management of Incidental Pulmonary Nodules Detected on CT Images: From the Fleischner Society 2017; Radiology 2017; 284:228-243. 5. Cardiomegaly with biatrial dilatation (right greater than left). 6. Aortic atherosclerosis, in addition to left main and 2 vessel coronary artery disease. Status post median sternotomy for CABG including LIMA to the LAD. 7. Additional incidental findings, as above. Electronically Signed   By: Trudie Reed M.D.   On: 09/05/2019 12:37   VAS US CAROTID  Result Date: 09/03/2019 Carotid Arterial Duplex Study Indications:      Pre-Op TAVR. Risk Factors:     Hypertension, hyperlipidemia, Diabetes, coronary artery                   disease. Other Factors:    Aortic valve disease, CKD III. Comparison Study: No prior study on file Performing Technologist: Sherren Kerns RVS  Examination Guidelines: A complete evaluation includes B-mode imaging, spectral Doppler, color Doppler, and power Doppler as needed of all accessible portions of each vessel. Bilateral testing is considered an integral part of a complete examination. Limited  examinations for reoccurring indications may be performed as noted.  Right Carotid Findings: +----------+--------+--------+--------+------------------+------------------+           PSV cm/sEDV cm/sStenosisPlaque DescriptionComments           +----------+--------+--------+--------+------------------+------------------+ CCA Prox  57      7                                                    +----------+--------+--------+--------+------------------+------------------+ CCA Distal59      13  intimal thickening +----------+--------+--------+--------+------------------+------------------+ ICA Prox  53      14              calcific                             +----------+--------+--------+--------+------------------+------------------+ ICA Distal53      12                                                   +----------+--------+--------+--------+------------------+------------------+ ECA       49      2                                                    +----------+--------+--------+--------+------------------+------------------+ +----------+--------+-------+--------+-------------------+           PSV cm/sEDV cmsDescribeArm Pressure (mmHG) +----------+--------+-------+--------+-------------------+ [id number]                                         +----------+--------+-------+--------+-------------------+ +---------+--------+--+--------+--+ VertebralPSV cm/s54EDV cm/s12 +---------+--------+--+--------+--+  Left Carotid Findings: +----------+--------+--------+--------+------------------+------------------+           PSV cm/sEDV cm/sStenosisPlaque DescriptionComments           +----------+--------+--------+--------+------------------+------------------+ CCA Prox  60      13                                intimal thickening +----------+--------+--------+--------+------------------+------------------+ CCA Distal53      6                                  intimal thickening +----------+--------+--------+--------+------------------+------------------+ ICA Prox  57      15              calcific                             +----------+--------+--------+--------+------------------+------------------+ ICA Distal67      24                                                   +----------+--------+--------+--------+------------------+------------------+ ECA       81      15                                                   +----------+--------+--------+--------+------------------+------------------+ +----------+--------+--------+--------+-------------------+           PSV cm/sEDV cm/sDescribeArm Pressure (mmHG) +----------+--------+--------+--------+-------------------+ Subclavian100                                         +----------+--------+--------+--------+-------------------+ +---------+--------+--+--------+--+  VertebralPSV cm/s66EDV cm/s13 +---------+--------+--+--------+--+   Summary: Right Carotid: Velocities in the right ICA are consistent with a 1-39% stenosis. Left Carotid: Velocities in the left ICA are consistent with a 1-39% stenosis. Vertebrals:  Bilateral vertebral arteries demonstrate antegrade flow. Subclavians: Normal flow hemodynamics were seen in bilateral subclavian              arteries. *See table(s) above for measurements and observations.  Electronically signed by Lemar LivingsBrandon Cain MD on 09/03/2019 at 10:42:53 AM.    Final    CT Angio Abd/Pel w/ and/or w/o  Result Date: 09/05/2019 CLINICAL DATA:  86 year old female with history of severe aortic stenosis. Preprocedural study prior to potential transcatheter aortic valve replacement (TAVR). EXAM: CT ANGIOGRAPHY CHEST, ABDOMEN AND PELVIS TECHNIQUE: Non-contrast CT of the chest was initially obtained. Multidetector CT imaging through the chest, abdomen and pelvis was performed using the standard protocol during bolus administration of  intravenous contrast. Multiplanar reconstructed images and MIPs were obtained and reviewed to evaluate the vascular anatomy. CONTRAST:  80mL OMNIPAQUE IOHEXOL 350 MG/ML SOLN COMPARISON:  No priors. FINDINGS: CTA CHEST FINDINGS Cardiovascular: Heart size is enlarged with biatrial dilatation (right greater than left). There is no significant pericardial fluid, thickening or pericardial calcification. There is aortic atherosclerosis, as well as atherosclerosis of the great vessels of the mediastinum and the coronary arteries, including calcified atherosclerotic plaque in the left main, left anterior descending and left circumflex coronary arteries. Status post median sternotomy for CABG including LIMA to the LAD. Severe thickening calcification of the aortic valve. Moderate to severe calcifications of the mitral annulus, particularly inferiorly. Mediastinum/Lymph Nodes: No pathologically enlarged mediastinal or hilar lymph nodes. In the mid esophagus (axial image 75 of series 15) there is a well-defined 1.0 x 0.7 cm high attenuation structure, which could represent an esophageal varix, or could represent a duplication cyst containing proteinaceous/calcific content. No axillary lymphadenopathy. Lungs/Pleura: A few scattered small pulmonary nodules are noted, largest of which measures only 5 mm in the posterior aspect of the right upper lobe (axial image 32 of series 16). No other larger more suspicious appearing pulmonary nodules or masses are noted. Bilateral apical pleuroparenchymal thickening and architectural distortion (left greater than right), most compatible with chronic post infectious or inflammatory scarring. Linear scarring also noted in the basal segments of the left lower lobe. Trace bilateral pleural effusions lying dependently. Musculoskeletal/Soft Tissues: There are no aggressive appearing lytic or blastic lesions noted in the visualized portions of the skeleton. Median sternotomy wires. CTA ABDOMEN AND  PELVIS FINDINGS Hepatobiliary: No suspicious cystic or solid hepatic lesions. No intra or extrahepatic biliary ductal dilatation. Gallbladder is unremarkable in appearance. Pancreas: No pancreatic mass. No pancreatic ductal dilatation. No pancreatic or peripancreatic fluid collections or inflammatory changes. Spleen: Unremarkable. Adrenals/Urinary Tract: Bilateral kidneys and adrenal glands are unremarkable in appearance. No hydroureteronephrosis. Urinary bladder is unremarkable in appearance. Stomach/Bowel: Normal appearance of the stomach. No pathologic dilatation of small bowel or colon. The appendix is not confidently identified and may be surgically absent. Regardless, there are no inflammatory changes noted adjacent to the cecum to suggest the presence of an acute appendicitis at this time. Vascular/Lymphatic: Aortic atherosclerosis with vascular findings and measurements pertinent to potential TAVR procedure, as detailed below. No aneurysm or dissection noted in the abdominal or pelvic vasculature. No lymphadenopathy noted in the abdomen or pelvis. Reproductive: Uterus and ovaries are atrophic. In the uterus there is a 2.4 cm densely calcified lesion (axial image 179 of series 15), presumably a calcified fibroid.  Other: No significant volume of ascites.  No pneumoperitoneum. Musculoskeletal: Chronic appearing compression fracture of L2 with 30% loss of anterior vertebral body height. There are no aggressive appearing lytic or blastic lesions noted in the visualized portions of the skeleton. VASCULAR MEASUREMENTS PERTINENT TO TAVR: AORTA: Minimal Aortic Diameter-9 x 11 mm Severity of Aortic Calcification-moderate RIGHT PELVIS: Right Common Iliac Artery - Minimal Diameter-6.3 x 3.7 mm Tortuosity - mild Calcification-moderate to severe Right External Iliac Artery - Minimal Diameter-5.6 x 5.8 mm Tortuosity - mild Calcification-mild Right Common Femoral Artery - Minimal Diameter-5.0 x 3.2 mm Tortuosity - mild  Calcification-moderate LEFT PELVIS: Left Common Iliac Artery - Minimal Diameter-5.8 x 6.7 mm Tortuosity - mild Calcification-moderate Left External Iliac Artery - Minimal Diameter-6.7 x 6.1 mm Tortuosity - mild Calcification-mild Left Common Femoral Artery - Minimal Diameter-5.6 x 5.0 mm Tortuosity-mild Calcification-mild to moderate Review of the MIP images confirms the above findings. IMPRESSION: 1. Vascular findings and measurements pertinent to potential TAVR procedure, as detailed above. 2. Severe thickening calcification of the aortic valve, compatible with the reported clinical history of severe aortic stenosis. 3. 1.0 x 0.7 cm high attenuation structure in the mid esophagus which may represent a large esophageal varix, or could simply represent a duplication cyst containing proteinaceous or calcific content. 4. Small pulmonary nodules in the lungs bilaterally measuring 5 mm or less in size, nonspecific, but statistically likely benign. No follow-up needed if patient is low-risk (and has no known or suspected primary neoplasm). Non-contrast chest CT can be considered in 12 months if patient is high-risk. This recommendation follows the consensus statement: Guidelines for Management of Incidental Pulmonary Nodules Detected on CT Images: From the Fleischner Society 2017; Radiology 2017; 284:228-243. 5. Cardiomegaly with biatrial dilatation (right greater than left). 6. Aortic atherosclerosis, in addition to left main and 2 vessel coronary artery disease. Status post median sternotomy for CABG including LIMA to the LAD. 7. Additional incidental findings, as above. Electronically Signed   By: Trudie Reed M.D.   On: 09/05/2019 12:37     STS Risk Calculator: Procedure: AV Replacement (isolated AVR)  Risk of Mortality: 8.906% Renal Failure: 4.665% Permanent Stroke: 5.407% Prolonged Ventilation: 16.793% DSW Infection: 0.125% Reoperation: 4.212% Morbidity or Mortality: 22.712% Short Length  of Stay: 16.316% Long Length of Stay: 13.058%    Assessment and Plan:   Nancy Mcguire is a 86 y.o. female with symptoms of severe, stage D1 aortic stenosis with NYHA Class IV symptoms who is currently admitted with acute heart failure requiring IV diuresis.  I have reviewed the patient's recent echocardiogram which is notable for preserved LV systolic function and severe aortic stenosis with peak gradient of 87.6 mm Hg and mean transvalvular gradient of 51.0 mmHG. The patient's dimensionless index is 0.14 and calculated aortic valve area is 0.39 cm. L/RHC showed 2 out of 3 patent bypass grafts in a left dominant system.  The vein graft to the diagonal branch is occluded at the origin.  The LIMA to the LAD is patent with significant proximal LAD disease proximal to the LIMA insertion and the vein graft to the circumflex has moderate disease in the mid to distal shaft.  The RCA is nondominant.  She was found to have severe aortic stenosis with an AVA of 0.6cm2 and an LVEDP of 18. Cardiac gated CTA of the heart which revealed anatomical characteristics consistent with severe aortic stenosis suitable for treatment by transcatheter aortic valve replacement without any significant complicating feature. CTA of the aorta and iliac vessels  demonstrate what appear to be adequate pelvic vascular access to facilitate a transfemoral approach (potentially on left side). Unfortunately, the patient developed acute kidney injury with a creat peaking at 4.95 felt to mostly likely be related to contrast induced nephropathy with possible ARB effect. Creatinine has improved to 2.88 today.   The patient's predicted risk of mortality with conventional aortic valve replacement is 8.906% primarily based on age, CAD with remote bypass surgery, acute heart failure, type 1 diabetes on an insulin pump, hypertension. Other significant comorbid conditions include severe pulmonary hypertension and recent AKI likely 2/2 contrast  induced nephropathy. Alternative approaches such as conventional aortic valve replacement, transcatheter aortic valve replacement, and continued medical therapy without intervention were compared and contrasted at length.  The risks associated with conventional surgical aortic valve replacement were discussed in detail, as were expectations for post-operative convalescence, and why we would be reluctant to consider this patient a candidate for conventional surgery. Issues specific to transcatheter aortic valve replacement were discussed including questions about long term valve durability, the potential for paravalvular leak, possible increased risk of need for permanent pacemaker placement, and other technical complications related to the procedure itself. Long-term prognosis with medical therapy was discussed. This discussion was placed in the context of the patient's own specific clinical presentation and past medical history.  All of their questions have been addressed.  The patient hopes to proceed with transcatheter aortic valve replacement in the near future. She understands we need to let her renal function recover before proceeding with TAVR. Tentative plan would be to discharge her to a short term rehab with plans for TAVR on 09/20/19.  Dr. Cornelius Moras to follow.    Signed, Cline Crock, PA-C  09/06/2019 12:20 PM    I have seen and examined the patient and agree with the assessment and plan as outlined above by Cline Crock, PA-C  Patient is an 86 year old female with history of aortic stenosis, coronary artery disease status post coronary artery bypass grafting in the remote past, stage III chronic kidney disease, hypertension, hyperlipidemia, type 1 diabetes mellitus, and pulmonary hypertension who was admitted with acute exacerbation of chronic diastolic congestive heart failure.  I have personally reviewed the patient's recent transthoracic echocardiogram, diagnostic cardiac catheterization,  CT angiograms, and EKG.  Echocardiogram reveals preserved left ventricular systolic function with severe aortic stenosis.  The aortic valve is trileaflet with severe thickening, calcification, and restricted leaflet mobility involving all 3 leaflets.  Peak velocity across aortic valve measured close to 4.7 m/s corresponding to mean transvalvular gradient estimated greater than 50 mmHg.  Diagnostic cardiac catheterization revealed severe native coronary artery disease with left dominant coronary circulation but continued patency of 2 of 3 previously placed bypass grafts performed at the time of the patient's coronary artery bypass surgery in the remote past.  I agree the patient would benefit from aortic valve replacement.  However, I would not consider this elderly patient a candidate for conventional surgery under any circumstances because of her advanced age, previous coronary artery bypass surgery, and numerous comorbid medical problems.  Cardiac-gated CTA of the heart reveals anatomical characteristics consistent with aortic stenosis suitable for treatment by transcatheter aortic valve replacement without any significant complicating features and CTA of the aorta and iliac vessels demonstrate what appears to be adequate pelvic vascular access to facilitate a transfemoral approach.  The patient hopes to proceed with TAVR in the near future once her renal function has normalized.  Following the decision to proceed with transcatheter aortic valve  replacement, a discussion has been held regarding what types of management strategies would be attempted intraoperatively in the event of life-threatening complications, including whether or not the patient would be considered a candidate for the use of cardiopulmonary bypass and/or conversion to open sternotomy for attempted surgical intervention.  The patient specifically requests that should a potentially life-threatening complication develop we would not attempt  emergency median sternotomy and/or other aggressive surgical procedures.  The patient has been advised of a variety of complications that might develop including but not limited to risks of death, stroke, paravalvular leak, aortic dissection or other major vascular complications, aortic annulus rupture, device embolization, cardiac rupture or perforation, mitral regurgitation, acute myocardial infarction, arrhythmia, heart block or bradycardia requiring permanent pacemaker placement, congestive heart failure, respiratory failure, renal failure, pneumonia, infection, other late complications related to structural valve deterioration or migration, or other complications that might ultimately cause a temporary or permanent loss of functional independence or other long term morbidity.    At this point the timing of surgery remains unclear and will depend upon the patient's continued recovery from presumed contrast-induced acute kidney injury.  Under the circumstances it might be reasonable to tentatively plan for surgery in approximately 2 weeks.  She will need close follow up once she is felt stable enough to be discharged from the hospital.   I spent in excess of 60 minutes during the conduct of this hospital encounter and >50% of this time involved direct face-to-face encounter with the patient for counseling and/or coordination of their care.   Purcell Nails, MD 09/06/2019 5:40 PM

## 2019-09-06 NOTE — Progress Notes (Signed)
Inpatient Diabetes Program Recommendations  AACE/ADA: New Consensus Statement on Inpatient Glycemic Control (2015)  Target Ranges:  Prepandial:   less than 140 mg/dL      Peak postprandial:   less than 180 mg/dL (1-2 hours)      Critically ill patients:  140 - 180 mg/dL   Lab Results  Component Value Date   GLUCAP 261 (H) 09/06/2019    Review of Glycemic Control Results for Nancy Mcguire, Nancy Mcguire (MRN [id number]) as of 09/06/2019 09:01  Ref. Range 09/05/2019 11:10 09/05/2019 16:22 09/05/2019 21:07 09/06/2019 02:53 09/06/2019 06:22  Glucose-Capillary Latest Ref Range: 70 - 99 mg/dL 795 (H) 369 (H) 223 (H) 245 (H) 261 (H)    Inpatient Diabetes Program Recommendations:     CBG's remain >200,  Please consider discontinuing pump and starting SQ basal bolus,  Lantus 13-14 units Q24 hours, Novolog 0-6 units tid + hs, Novolog 2 units tid meal coverage if eating >50% of meals  Thank you, Dulce Sellar, RN, BSN Diabetes Coordinator Inpatient Diabetes Program [phone] (team pager from 8a-5p)

## 2019-09-06 NOTE — Progress Notes (Signed)
Physical Therapy Treatment Patient Details Name: Nancy Mcguire MRN: [id number] DOB: 1929-07-31 Today's Date: 09/06/2019    History of Present Illness Pt is an 86 y/o female admitted secondary to CHF exacerbation. Was also found to have severe aortic stenosis and is being assessed for TAVR. Pt for heart cath on 08/19/2019. PMH includes HTN, CAD s/p CABG, CHF, and DM.     PT Comments    Pt seen for Pre-TAVR assessment. Order inadvertently cancelled after initial eval and re-ordered, so pt seen for treatment. Overall tolerated well, however, did require standing rest during 6 minute walk test secondary to fatigue. Required seated rest in between tests. Current recommendations appropriate. Will continue to follow acutely to maximize functional mobility independence and safety.    Follow Up Recommendations  Home health PT;Supervision for mobility/OOB     Equipment Recommendations  None recommended by PT    Recommendations for Other Services       Precautions / Restrictions Precautions Precautions: Fall Restrictions Weight Bearing Restrictions: No    Mobility  Bed Mobility Overal bed mobility: Needs Assistance Bed Mobility: Supine to Sit;Sit to Supine     Supine to sit: Supervision Sit to supine: Min assist   General bed mobility comments: Min A for LE assist to return to supine.   Transfers Overall transfer level: Needs assistance Equipment used: Rolling walker (2 wheeled) Transfers: Sit to/from Stand Sit to Stand: Min guard         General transfer comment: Min guard for safety. No physical assist required.   Ambulation/Gait Ambulation/Gait assistance: Min Gaffer (Feet): 270 Feet Assistive device: Rolling walker (2 wheeled) Gait Pattern/deviations: Step-through pattern;Decreased stride length Gait velocity: Decreased Gait velocity interpretation: <1.31 ft/sec, indicative of household ambulator General Gait Details: Slow, cautious gait during  6 minute walk test and during 5 meter walk test for pre-TAVR. No LOB noted. Required standing rest X1.    Stairs             Wheelchair Mobility    Modified Rankin (Stroke Patients Only)       Balance Overall balance assessment: Needs assistance Sitting-balance support: No upper extremity supported;Feet supported Sitting balance-Leahy Scale: Good     Standing balance support: Bilateral upper extremity supported;During functional activity Standing balance-Leahy Scale: Poor Standing balance comment: Reliant on BUE support                             Cognition Arousal/Alertness: Awake/alert Behavior During Therapy: WFL for tasks assessed/performed Overall Cognitive Status: Within Functional Limits for tasks assessed                                        Exercises      General Comments General comments (skin integrity, edema, etc.): Pt's husband present during session       Pertinent Vitals/Pain Pain Assessment: No/denies pain    Home Living                      Prior Function            PT Goals (current goals can now be found in the care plan section) Acute Rehab PT Goals Patient Stated Goal: to go home PT Goal Formulation: With patient Time For Goal Achievement: 09/15/19 Potential to Achieve Goals: Good Progress towards PT goals: Progressing toward goals  Frequency    Min 3X/week      PT Plan Current plan remains appropriate    Co-evaluation              AM-PAC PT "6 Clicks" Mobility   Outcome Measure  Help needed turning from your back to your side while in a flat bed without using bedrails?: None Help needed moving from lying on your back to sitting on the side of a flat bed without using bedrails?: None Help needed moving to and from a bed to a chair (including a wheelchair)?: A Little Help needed standing up from a chair using your arms (e.g., wheelchair or bedside chair)?: A Little Help  needed to walk in hospital room?: A Little Help needed climbing 3-5 steps with a railing? : A Lot 6 Click Score: 19    End of Session Equipment Utilized During Treatment: Gait belt Activity Tolerance: Patient tolerated treatment well Patient left: in bed;with call bell/phone within reach;with nursing/sitter in room;with family/visitor present Nurse Communication: Mobility status PT Visit Diagnosis: Muscle weakness (generalized) (M62.81)     Time: 2878-6767 PT Time Calculation (min) (ACUTE ONLY): 41 min  Charges:  $Gait Training: 23-37 mins $Therapeutic Activity: 8-22 mins                     Cindee Salt, DPT  Acute Rehabilitation Services  Pager: [phone] Office: [phone]    Lehman Prom 09/06/2019, 3:49 PM

## 2019-09-06 NOTE — Consult Note (Addendum)
[facility]   MULTIDISCIPLINARY HEART VALVE TEAM  Inpatient TAVR Consultation:   Patient ID: Nancy Mcguire; [id number]; 03/08/1930   Admit date: 08/21/2019 Date of Consult: 09/06/2019  Primary Care Provider: Aguiar, Rafaela M, MD Primary Cardiologist: Dr. Berry    Patient Profile:   Nancy Mcguire is a 86 y.o. female with a hx of HTN, HLD, CAD s/p remote CABG x3, CKD stage IIIA, DMT1 on an insulin pump (diagnosed in her 30s), pulmonary HTN and severe aortic stenosis  who is being seen today for the evaluation of severe AS at the request of Dr. McAlhany.  History of Present Illness:   [name]  lives with her husband in [place], . She has three daughters, one who lives locally. She functions independently and stays very busy.  She no longer drives, but goes to the grocery store and takes care of all of her own ADLs.  She used to use her Rollator to walk around the neighborhood for exercise but has not been able to do that for several months given worsening shortness of breath and fatigue.  She has also limited by back pain.  Of note, she regularly sees the dentist and has had no current dental issues.  She underwent CABG x3 in [place] 26years ago and has not had recurrent issues since. She and her husband moved from [place] to [place] 6 years ago. She was last seen by Dr. Berry on 01/04/2019 and was completely asymptomatic at that time. She underwent carotid Dopplers at [facility] on 09/21/2018 which showed 1-39% stenosis bilaterally.  Echo 01/13/19 showed EF 60-65%, mild MR, mild-mod TR, severe AS with a mean gradient of 43 mm hg and moderate pulmonary HTN. It appears her severe AS was missed at this time. She was seen in our clinic by Jesse Cleaver NP on 08/09/19 for evaluation of shortness of breath and chest pressure which was felt to be most consistent with chest wall pain. She was noted to have a systolic murmur at that time. Continued monitoring was  recommended.   She presented to [facility] on 09/02/2019 with worsening shortness of breath, DOE and chest pain. In the ER, CXR showed cardiomegaly, elevation of the left hemidiaphragm, small biilateral pleural effusions and or pleural thickening. CTA of chest negative for PE. There was extensive atherosclerotic disease including coronary arteries and small to moderate bilateral pleural effusions with compressive atelectasis of lower lobes. HS Troponin 21; 26, BNP 1024, HGb 10.9, WBC 5.1 plts 219, Na 135, K+ 4.9, glucose 262, BUN 25 and Cr 0.99 and COVID neg. Repeat echo 08/30/19 showed EF 60-65%, mild LVH, G2DD, elevated LVEDP, mild RV dysfunction, severely elevated pulmonary artery pressure (82.2 mm Hg), mild biatrial enlargement, mild-mod MR, severe AS with a mean gradient of 51 mm Hg and trivial AI. She has been diuersed with IV lasix with symptomatic improvement.    The multidisciplinary valve team was consulted for consideration of TAVR and she was seen by Dr. McAlhany.  She underwent left and right heart catheterization on 5/20 which showed 2 out of 3 patent bypass grafts in a left dominant system.  The vein graft to the diagonal branch is occluded at the origin.  The LIMA to the LAD is patent with significant proximal LAD disease proximal to the LIMA insertion and the vein graft to the circumflex has moderate disease in the mid to distal shaft.  The RCA is nondominant.  She was found to have severe aortic stenosis with an AVA of   0.6cm2 and an LVEDP of 18.  On 5/21, she underwent cardiac gated CTA of the heart which revealed anatomical characteristics consistent with severe aortic stenosis suitable for treatment by transcatheter aortic valve replacement without any significant complicating feature.  CTA of the aorta and iliac vessels demonstrate what appear to be adequate pelvic vascular access to facilitate a transfemoral approach (potentially on left side).  Unfortunately, the patient developed acute kidney  injury with a creat peaking at 4.95 felt to mostly likely be related to contrast induced nephropathy with possible ARB effect. Losartan was discontinued on 5/22. She was treated with bicarb given metabolic acidosis from renal failure. Her creat has now improved to 2.88 and she has had increased urine output with 80 mg IV lasix. She is also growing gram negative rods in her urine and being treated for a UTI.   She is currently feeling much better. Husband in room with her. Shortness of breath has improved. Anxious to get valve done but she understands that we need to be careful as the TAVR procedure requires contrast dye.   Past Medical History:  Diagnosis Date  . CAD (coronary artery disease)   . Carotid stenosis   . Chronic back pain   . CKD (chronic kidney disease) stage 3, GFR 30-59 ml/min   . Diabetes mellitus type 1 (HCC)   . Hyperlipidemia   . Hypertension   . Hypothyroidism   . Osteoporosis     Past Surgical History:  Procedure Laterality Date  . CESAREAN SECTION    . CORONARY ARTERY BYPASS GRAFT     X 3  . RIGHT/LEFT HEART CATH AND CORONARY/GRAFT ANGIOGRAPHY N/A 08/13/2019   Procedure: RIGHT/LEFT HEART CATH AND CORONARY/GRAFT ANGIOGRAPHY;  Surgeon: Berry, Jonathan J, MD;  Location: [facility];  Service: Cardiovascular;  Laterality: N/A;  . TRIGGER FINGER RELEASE       Inpatient Medications: Scheduled Meds: . aspirin  81 mg Oral Daily  . heparin injection (subcutaneous)  5,000 Units Subcutaneous Q8H  . insulin pump   Subcutaneous TID WC, HS, 0200  . levothyroxine  50 mcg Oral QAC breakfast  . NIFEdipine  30 mg Oral Q12H  . potassium chloride  20 mEq Oral Once  . senna-docusate  1 tablet Oral BID  . simvastatin  20 mg Oral q1800  . sodium bicarbonate  650 mg Oral TID  . sodium chloride flush  3 mL Intravenous Q12H   Continuous Infusions: . sodium chloride    . cefTRIAXone (ROCEPHIN)  IV 1 g (09/06/19 1139)   PRN Meds: sodium chloride, acetaminophen,  lactulose, ondansetron (ZOFRAN) IV, sodium chloride flush  Allergies:    Allergies  Allergen Reactions  . Neomycin-Bacitracin Zn-Polymyx Other (See Comments)    Eyes irritation   . Hydrocodone-Acetaminophen Nausea And Vomiting    Social History:   Social History   Socioeconomic History  . Marital status: Married    Spouse name: Not on file  . Number of children: Not on file  . Years of education: Not on file  . Highest education level: Not on file  Occupational History  . Not on file  Tobacco Use  . Smoking status: Never Smoker  . Smokeless tobacco: Never Used  Substance and Sexual Activity  . Alcohol use: Yes  . Drug use: Never  . Sexual activity: Not on file  Other Topics Concern  . Not on file  Social History Narrative  . Not on file   Social Determinants of Health   Financial Resource   Strain:   . Difficulty of Paying Living Expenses:   Food Insecurity:   . Worried About Running Out of Food in the Last Year:   . Ran Out of Food in the Last Year:   Transportation Needs:   . Lack of Transportation (Medical):   . Lack of Transportation (Non-Medical):   Physical Activity:   . Days of Exercise per Week:   . Minutes of Exercise per Session:   Stress:   . Feeling of Stress :   Social Connections:   . Frequency of Communication with Friends and Family:   . Frequency of Social Gatherings with Friends and Family:   . Attends Religious Services:   . Active Member of Clubs or Organizations:   . Attends Club or Organization Meetings:   . Marital Status:   Intimate Partner Violence:   . Fear of Current or Ex-Partner:   . Emotionally Abused:   . Physically Abused:   . Sexually Abused:     Family History:   The patient's family history includes Healthy in her mother.  ROS:  Please see the history of present illness.  ROS  All other ROS reviewed and negative.     Physical Exam/Data:   Vitals:   09/05/19 1932 09/05/19 2130 09/06/19 0300 09/06/19 1115  BP:  (!) 119/55 124/65 125/65 138/70  Pulse: 81 91 89 86  Resp: 15  17 16  Temp: 98.3 F (36.8 C)  98.1 F (36.7 C) 97.7 F (36.5 C)  TempSrc: Oral  Oral Oral  SpO2: 93%  90% 100%  Weight:   53.3 kg   Height:        Intake/Output Summary (Last 24 hours) at 09/06/2019 1220 Last data filed at 09/06/2019 0825 Gross per 24 hour  Intake 880 ml  Output 600 ml  Net 280 ml   Filed Weights   09/04/19 0333 09/05/19 0331 09/06/19 0300  Weight: 52.5 kg 53.9 kg 53.3 kg   Body mass index is 21.47 kg/m.  General:  Well nourished, well developed, in no acute distress HEENT: normal Lymph: no adenopathy Neck: no JVD Endocrine:  No thryoid Cardiac:  normal S1, S2; RRR; 3/6 harsh SEM heard best @ LUSB Lungs:  clear to auscultation bilaterally, no wheezing, rhonchi or rales  Abd: soft, nontender, no hepatomegaly  Ext: no edema Musculoskeletal:  No deformities, BUE and BLE strength normal and equal Skin: warm and dry  Neuro:  CNs 2-12 intact, no focal abnormalities noted Psych:  Normal affect   EKG:  The EKG was personally reviewed and demonstrates:  Sinus bradycardia HR 58 Telemetry:  Telemetry was personally reviewed and demonstrates:  Sinus brady  Relevant CV Studies: Echo 08/30/19 IMPRESSIONS  1. Left ventricular ejection fraction, by estimation, is 60 to 65%. The  left ventricle has normal function. The left ventricle has no regional  wall motion abnormalities. There is mild left ventricular hypertrophy.  Left ventricular diastolic parameters  are consistent with Grade II diastolic dysfunction (pseudonormalization).  Elevated left ventricular end-diastolic pressure.  2. Right ventricular systolic function is mildly reduced. The right  ventricular size is normal. There is severely elevated pulmonary artery  systolic pressure. The estimated right ventricular systolic pressure is  82.2 mmHg.  3. Left atrial size was mildly dilated.  4. Right atrial size was mildly dilated.  5. The  mitral valve is abnormal. Mild to moderate mitral valve  regurgitation.  6. Tricuspid valve regurgitation is mild to moderate.  7. The aortic valve is tricuspid. Aortic   valve regurgitation is trivial.  Severe aortic valve stenosis. Aortic valve area, by VTI measures 0.36 cm.  Aortic valve mean gradient measures 51.0 mmHg. Aortic valve Vmax measures  4.68 m/s.  8. The inferior vena cava is dilated in size with <50% respiratory  variability, suggesting right atrial pressure of 15 mmHg.   Comparison(s): Changes from prior study are noted. 01/13/2019: LVEF 60-65%,  severe AS - mean gradient 43 mmHg.   FINDINGS  Left Ventricle: Left ventricular ejection fraction, by estimation, is 60  to 65%. The left ventricle has normal function. The left ventricle has no  regional wall motion abnormalities. The left ventricular internal cavity  size was normal in size. There is  mild left ventricular hypertrophy. Left ventricular diastolic parameters  are consistent with Grade II diastolic dysfunction (pseudonormalization).  Elevated left ventricular end-diastolic pressure.   Right Ventricle: The right ventricular size is normal. No increase in  right ventricular wall thickness. Right ventricular systolic function is  mildly reduced. There is severely elevated pulmonary artery systolic  pressure. The tricuspid regurgitant  velocity is 4.10 m/s, and with an assumed right atrial pressure of 15  mmHg, the estimated right ventricular systolic pressure is 82.2 mmHg.   Left Atrium: Left atrial size was mildly dilated.   Right Atrium: Right atrial size was mildly dilated.   Pericardium: There is no evidence of pericardial effusion.   Mitral Valve: The mitral valve is abnormal. There is moderate thickening  of the mitral valve leaflet(s). There is moderate calcification of the  posterior mitral valve leaflet(s). Moderate mitral annular calcification.  Mild to moderate mitral valve  regurgitation.    Tricuspid Valve: The tricuspid valve is grossly normal. Tricuspid valve  regurgitation is mild to moderate.   Aortic Valve: The aortic valve is tricuspid. Aortic valve regurgitation is  trivial. Severe aortic stenosis is present. Aortic valve mean gradient  measures 51.0 mmHg. Aortic valve peak gradient measures 87.6 mmHg. Aortic  valve area, by VTI measures 0.36  cm.   Pulmonic Valve: The pulmonic valve was normal in structure. Pulmonic valve  regurgitation is not visualized.   Aorta: The aortic root and ascending aorta are structurally normal, with  no evidence of dilitation.   Venous: The inferior vena cava is dilated in size with less than 50%  respiratory variability, suggesting right atrial pressure of 15 mmHg.   IAS/Shunts: No atrial level shunt detected by color flow Doppler.     LEFT VENTRICLE  PLAX 2D  LVIDd:     4.20 cm Diastology  LVIDs:     3.10 cm LV e' lateral:  6.76 cm/s  LV PW:     1.10 cm LV E/e' lateral: 18.9  LV IVS:    1.10 cm LV e' medial:  4.04 cm/s  LVOT diam:   1.80 cm LV E/e' medial: 31.7  LV SV:     41  LV SV Index:  27  LVOT Area:   2.54 cm     RIGHT VENTRICLE  RV Basal diam: 2.80 cm  RV S prime:   6.10 cm/s  TAPSE (M-mode): 2.3 cm   LEFT ATRIUM       Index    RIGHT ATRIUM      Index  LA diam:    4.50 cm 2.92 cm/m RA Area:   18.80 cm  LA Vol (A2C):  52.2 ml 33.93 ml/m RA Volume:  54.70 ml 35.55 ml/m  LA Vol (A4C):  45.5 ml 29.57 ml/m  LA Biplane Vol: 48.4   ml 31.46 ml/m  AORTIC VALVE  AV Area (Vmax):  0.39 cm  AV Area (Vmean):  0.34 cm  AV Area (VTI):   0.36 cm  AV Vmax:      468.00 cm/s  AV Vmean:     317.333 cm/s  AV VTI:      1.147 m  AV Peak Grad:   87.6 mmHg  AV Mean Grad:   51.0 mmHg  LVOT Vmax:     71.70 cm/s  LVOT Vmean:    42.800 cm/s  LVOT VTI:     0.163 m  LVOT/AV VTI ratio: 0.14    AORTA  Ao Root diam:  2.60 cm   MITRAL VALVE        TRICUSPID VALVE  MV Area (PHT): 5.20 cm   TR Peak grad:  67.2 mmHg  MV Decel Time: 146 msec   TR Vmax:    410.00 cm/s  MV E velocity: 128.00 cm/s  MV A velocity: 111.00 cm/s SHUNTS  MV E/A ratio: 1.15     Systemic VTI: 0.16 m  Systemic Diam: 1.80 cm    __________________  Carotid dopplers 09/03/2019 Summary:  Right Carotid: Velocities in the right ICA are consistent with a 1-39%  stenosis.   Left Carotid: Velocities in the left ICA are consistent with a 1-39%  stenosis.   Vertebrals: Bilateral vertebral arteries demonstrate antegrade flow.  Subclavians: Normal flow hemodynamics were seen in bilateral subclavian        arteries.   __________________  L/[facility] 08/18/2019 RIGHT/LEFT HEART CATH AND CORONARY/GRAFT ANGIOGRAPHY  Conclusion    Prox LAD to Mid LAD lesion is 90% stenosed.  Mid LAD-1 lesion is 90% stenosed.  Mid LAD-2 lesion is 90% stenosed.  Origin to Prox Graft lesion is 100% stenosed.  Ost Cx to Prox Cx lesion is 90% stenosed.  2nd Mrg lesion is 90% stenosed.  Mid Graft to Dist Graft lesion is 60% stenosed.  Prox Graft to Mid Graft lesion is 40% stenosed.  Hemodynamic findings consistent with aortic valve stenosis.   Nancy Mcguire is a 86 y.o. female    [id number] LOCATION:  FACILITY: [facility]  PHYSICIAN: Jonathan Berry, M.D. 07/06/1929   DATE OF PROCEDURE:  09/04/2019  DATE OF DISCHARGE:     CARDIAC CATHETERIZATION     History obtained from chart review.86 y.o.femalewith a hx of HTN, CAD (CABG X3 26 yrs ago), DM-1(on insulin pump) and AS with echo 01/2019 with AVarea by VTI of 0.51who is being seen today for the evaluation of SOB and severe AS.   IMPRESSION: Ms. Darley has 2 of 3 grafts patent in the left dominant system.  The vein graft to the diagonal branch is occluded at the origin.  The LIMA to the LAD is patent with significant proximal LAD disease proximal  to LIMA insertion.  The vein graft to the circumflex has moderate disease in the mid and distal shaft.  The RCA is nondominant.  She has severe aortic stenosis with a valve area between 0.6 1.8 by Fick or thermal dilution and EDP of 18.  I did shoot her distal abdominal aorta revealing widely patent iliacs arteries.  I reviewed the films with Dr. McAlhany who feels that she is a acceptable candidate for TAVR.  I performed angiography of the right common femoral artery and and successfully deployed a MYNX closure device in the right common femoral artery and vein achieving hemostasis.  The patient left lab in stable condition.   __________________   Cardiac CT 09/02/19   ADDENDUM: Extracardiac findings are described separately under dictation for contemporaneously obtained CTA chest, abdomen and pelvis 07/03/2019. Please see that dictation for full description of relevant extracardiac findings.   Electronically Signed   By: Daniel  Entrikin M.D.   On: 09/05/2019 11:33   Addended by Entrikin, Daniel W, MD on 09/05/2019 11:36 AM    Study Result  CLINICAL DATA:  Aortic Stenosis  EXAM: Cardiac TAVR CT  TECHNIQUE: The patient was scanned on a Siemens Force 192 slice scanner. A 120 kV retrospective scan was triggered in the ascending thoracic aorta at 140 HU's. Gantry rotation speed was 250 msecs and collimation was .6 mm. No beta blockade or nitro were given. The 3D data set was reconstructed in 5% intervals of the R-R cycle. Systolic and diastolic phases were analyzed on a dedicated work station using MPR, MIP and VRT modes. The patient received 80 cc of contrast.  FINDINGS: Aortic Valve: Tri leaflet calcified with restricted leaflet motion  Aorta: No aneurysm normal arch vessels moderate calcific atherosclerosis  Sino-tubular Junction: 23 mm  Ascending Thoracic Aorta: 26 mm  Aortic Arch: 26 mm  Descending Thoracic Aorta: 19 mm  Sinus of Valsalva  Measurements:  Non-coronary: 29 mm  Right - coronary: 28.4 mm  Left -   coronary: 28.3 mm  Coronary Artery Height above Annulus:  Left Main: 13.1 mm above annulus  Right Coronary: 14.4 mm above annulus  Virtual Basal Annulus Measurements:  Maximum / Minimum Diameter: 25.6 mm x 20.65 mm  Perimeter: 72.4 mm  Area: 388 mm2  Coronary Arteries: Sufficient height above annulus for deployment Patent SVG to OM and Patent LIMA to LAD  Optimum Fluoroscopic Angle for Delivery: LAO 14 Caudal 14 degrees  IMPRESSION: 1. Tri leaflet AV with annular area of 388 mm2 suitable for a 23 mm Sapien 3 valve  2. Coronary arteries suitable height above annulus for deployment Patent SVG to OM and LIMA to LAD  3.  Normal ascending aortic root diameter 2.6 cm  4. Optimum angiographic angle for deployment LAO 14 Caudal 14 degrees  Peter Nishan     __________________  CT angio chest/abd/pelvis 09/02/19 Study Result  CLINICAL DATA:  86-year-old female with history of severe aortic stenosis. Preprocedural study prior to potential transcatheter aortic valve replacement (TAVR).  EXAM: CT ANGIOGRAPHY CHEST, ABDOMEN AND PELVIS  TECHNIQUE: Non-contrast CT of the chest was initially obtained.  Multidetector CT imaging through the chest, abdomen and pelvis was performed using the standard protocol during bolus administration of intravenous contrast. Multiplanar reconstructed images and MIPs were obtained and reviewed to evaluate the vascular anatomy.  CONTRAST:  80mL OMNIPAQUE IOHEXOL 350 MG/ML SOLN  COMPARISON:  No priors.  FINDINGS: CTA CHEST FINDINGS  Cardiovascular: Heart size is enlarged with biatrial dilatation (right greater than left). There is no significant pericardial fluid, thickening or pericardial calcification. There is aortic atherosclerosis, as well as atherosclerosis of the great vessels of the mediastinum and the coronary arteries, including  calcified atherosclerotic plaque in the left main, left anterior descending and left circumflex coronary arteries. Status post median sternotomy for CABG including LIMA to the LAD. Severe thickening calcification of the aortic valve. Moderate to severe calcifications of the mitral annulus, particularly inferiorly.  Mediastinum/Lymph Nodes: No pathologically enlarged mediastinal or hilar lymph nodes. In the mid esophagus (axial image 75 of series 15) there is a well-defined 1.0 x 0.7 cm high attenuation structure, which could represent an esophageal varix, or could represent a duplication cyst containing proteinaceous/calcific content. No axillary   lymphadenopathy.  Lungs/Pleura: A few scattered small pulmonary nodules are noted, largest of which measures only 5 mm in the posterior aspect of the right upper lobe (axial image 32 of series 16). No other larger more suspicious appearing pulmonary nodules or masses are noted. Bilateral apical pleuroparenchymal thickening and architectural distortion (left greater than right), most compatible with chronic post infectious or inflammatory scarring. Linear scarring also noted in the basal segments of the left lower lobe. Trace bilateral pleural effusions lying dependently.  Musculoskeletal/Soft Tissues: There are no aggressive appearing lytic or blastic lesions noted in the visualized portions of the skeleton. Median sternotomy wires.  CTA ABDOMEN AND PELVIS FINDINGS  Hepatobiliary: No suspicious cystic or solid hepatic lesions. No intra or extrahepatic biliary ductal dilatation. Gallbladder is unremarkable in appearance.  Pancreas: No pancreatic mass. No pancreatic ductal dilatation. No pancreatic or peripancreatic fluid collections or inflammatory changes.  Spleen: Unremarkable.  Adrenals/Urinary Tract: Bilateral kidneys and adrenal glands are unremarkable in appearance. No hydroureteronephrosis. Urinary bladder is  unremarkable in appearance.  Stomach/Bowel: Normal appearance of the stomach. No pathologic dilatation of small bowel or colon. The appendix is not confidently identified and may be surgically absent. Regardless, there are no inflammatory changes noted adjacent to the cecum to suggest the presence of an acute appendicitis at this time.  Vascular/Lymphatic: Aortic atherosclerosis with vascular findings and measurements pertinent to potential TAVR procedure, as detailed below. No aneurysm or dissection noted in the abdominal or pelvic vasculature. No lymphadenopathy noted in the abdomen or pelvis.  Reproductive: Uterus and ovaries are atrophic. In the uterus there is a 2.4 cm densely calcified lesion (axial image 179 of series 15), presumably a calcified fibroid.  Other: No significant volume of ascites.  No pneumoperitoneum.  Musculoskeletal: Chronic appearing compression fracture of L2 with 30% loss of anterior vertebral body height. There are no aggressive appearing lytic or blastic lesions noted in the visualized portions of the skeleton.  VASCULAR MEASUREMENTS PERTINENT TO TAVR:  AORTA:  Minimal Aortic Diameter-9 x 11 mm  Severity of Aortic Calcification-moderate  RIGHT PELVIS:  Right Common Iliac Artery -  Minimal Diameter-6.3 x 3.7 mm  Tortuosity - mild  Calcification-moderate to severe  Right External Iliac Artery -  Minimal Diameter-5.6 x 5.8 mm  Tortuosity - mild  Calcification-mild  Right Common Femoral Artery -  Minimal Diameter-5.0 x 3.2 mm  Tortuosity - mild  Calcification-moderate  LEFT PELVIS:  Left Common Iliac Artery -  Minimal Diameter-5.8 x 6.7 mm  Tortuosity - mild  Calcification-moderate  Left External Iliac Artery -  Minimal Diameter-6.7 x 6.1 mm  Tortuosity - mild  Calcification-mild  Left Common Femoral Artery -  Minimal Diameter-5.6 x 5.0 mm  Tortuosity-mild  Calcification-mild  to moderate  Review of the MIP images confirms the above findings.  IMPRESSION: 1. Vascular findings and measurements pertinent to potential TAVR procedure, as detailed above. 2. Severe thickening calcification of the aortic valve, compatible with the reported clinical history of severe aortic stenosis. 3. 1.0 x 0.7 cm high attenuation structure in the mid esophagus which may represent a large esophageal varix, or could simply represent a duplication cyst containing proteinaceous or calcific content. 4. Small pulmonary nodules in the lungs bilaterally measuring 5 mm or less in size, nonspecific, but statistically likely benign. No follow-up needed if patient is low-risk (and has no known or suspected primary neoplasm). Non-contrast chest CT can be considered in 12 months if patient is high-risk. This recommendation follows the consensus statement: Guidelines for Management of   Incidental Pulmonary Nodules Detected on CT Images: From the Fleischner Society 2017; Radiology 2017; 284:228-243. 5. Cardiomegaly with biatrial dilatation (right greater than left). 6. Aortic atherosclerosis, in addition to left main and 2 vessel coronary artery disease. Status post median sternotomy for CABG including LIMA to the LAD. 7. Additional incidental findings, as above.     Laboratory Data:  Chemistry Recent Labs  Lab 09/04/19 0728 09/05/19 0645 09/06/19 0816  NA 133* 130* 135  K 3.7 3.6 2.8*  CL 101 98 100  CO2 22 18* 22  GLUCOSE 191* 205* 226*  BUN 50* 64* 60*  CREATININE 3.88* 4.95* 2.88*  CALCIUM 7.8* 7.9* 8.1*  GFRNONAA 10* 7* 14*  GFRAA 11* 8* 16*  ANIONGAP 10 14 13    Recent Labs  Lab 08/31/19 0440 09/06/19 0816  ALBUMIN 3.0* 2.9*   Hematology Recent Labs  Lab 09/03/19 0802 09/04/19 0728 09/06/19 0816  WBC 9.1 8.1 6.0  RBC 3.31* 3.20* 3.56*  HGB 9.8* 9.5* 10.4*  HCT 31.4* 30.1* 32.3*  MCV 94.9 94.1 90.7  MCH 29.6 29.7 29.2  MCHC 31.2 31.6 32.2  RDW 15.9*  15.6* 14.9  PLT 191 195 260   Cardiac EnzymesNo results for input(s): TROPONINI in the last 168 hours. No results for input(s): TROPIPOC in the last 168 hours.  BNPNo results for input(s): BNP, PROBNP in the last 168 hours.  DDimer No results for input(s): DDIMER in the last 168 hours.  Radiology/Studies:  US RENAL  Result Date: 09/03/2019 CLINICAL DATA:  [age] year female with acute kidney injury EXAM: RENAL / URINARY TRACT ULTRASOUND COMPLETE COMPARISON:  None. FINDINGS: Right Kidney: Length: 11.3 cm x 4.2 cm x 4.7 cm, 97 cc. Echogenicity within normal limits. No mass or hydronephrosis visualized. Left Kidney: Length: 9.0 cm x 5.0 cm x 4.3 cm 102 cc. Echogenicity within normal limits. No mass or hydronephrosis visualized. Bladder: Appears normal for degree of bladder distention. IMPRESSION: Negative for hydronephrosis of the left or right kidney. Electronically Signed   By: Jaime  Wagner D.O.   On: 09/03/2019 10:49   CT CORONARY MORPH W/CTA COR W/SCORE W/CA W/CM &/OR WO/CM  Addendum Date: 09/05/2019   ADDENDUM REPORT: 09/05/2019 11:33 ADDENDUM: Extracardiac findings are described separately under dictation for contemporaneously obtained CTA chest, abdomen and pelvis 07/03/2019. Please see that dictation for full description of relevant extracardiac findings. Electronically Signed   By: Daniel  Entrikin M.D.   On: 09/05/2019 11:33   Result Date: 09/05/2019 CLINICAL DATA:  Aortic Stenosis EXAM: Cardiac TAVR CT TECHNIQUE: The patient was scanned on a Siemens Force 192 slice scanner. A 120 kV retrospective scan was triggered in the ascending thoracic aorta at 140 HU's. Gantry rotation speed was 250 msecs and collimation was .6 mm. No beta blockade or nitro were given. The 3D data set was reconstructed in 5% intervals of the R-R cycle. Systolic and diastolic phases were analyzed on a dedicated work station using MPR, MIP and VRT modes. The patient received 80 cc of contrast. FINDINGS: Aortic  Valve: Tri leaflet calcified with restricted leaflet motion Aorta: No aneurysm normal arch vessels moderate calcific atherosclerosis Sino-tubular Junction: 23 mm Ascending Thoracic Aorta: 26 mm Aortic Arch: 26 mm Descending Thoracic Aorta: 19 mm Sinus of Valsalva Measurements: Non-coronary: 29 mm Right - coronary: 28.4 mm Left -   coronary: 28.3 mm Coronary Artery Height above Annulus: Left Main: 13.1 mm above annulus Right Coronary: 14.4 mm above annulus Virtual Basal Annulus Measurements: Maximum / Minimum Diameter: 25.6 mm x   20.65 mm Perimeter: 72.4 mm Area: 388 mm2 Coronary Arteries: Sufficient height above annulus for deployment Patent SVG to OM and Patent LIMA to LAD Optimum Fluoroscopic Angle for Delivery: LAO 14 Caudal 14 degrees IMPRESSION: 1. Tri leaflet AV with annular area of 388 mm2 suitable for a 23 mm Sapien 3 valve 2. Coronary arteries suitable height above annulus for deployment Patent SVG to OM and LIMA to LAD 3.  Normal ascending aortic root diameter 2.6 cm 4. Optimum angiographic angle for deployment LAO 14 Caudal 14 degrees Peter Nishan Electronically Signed: By: Peter  Nishan M.D. On: 09/02/2019 17:15   CT ANGIO CHEST AORTA W/CM & OR WO/CM  Result Date: 09/05/2019 CLINICAL DATA:  86-year-old female with history of severe aortic stenosis. Preprocedural study prior to potential transcatheter aortic valve replacement (TAVR). EXAM: CT ANGIOGRAPHY CHEST, ABDOMEN AND PELVIS TECHNIQUE: Non-contrast CT of the chest was initially obtained. Multidetector CT imaging through the chest, abdomen and pelvis was performed using the standard protocol during bolus administration of intravenous contrast. Multiplanar reconstructed images and MIPs were obtained and reviewed to evaluate the vascular anatomy. CONTRAST:  80mL OMNIPAQUE IOHEXOL 350 MG/ML SOLN COMPARISON:  No priors. FINDINGS: CTA CHEST FINDINGS Cardiovascular: Heart size is enlarged with biatrial dilatation (right greater than left). There is no  significant pericardial fluid, thickening or pericardial calcification. There is aortic atherosclerosis, as well as atherosclerosis of the great vessels of the mediastinum and the coronary arteries, including calcified atherosclerotic plaque in the left main, left anterior descending and left circumflex coronary arteries. Status post median sternotomy for CABG including LIMA to the LAD. Severe thickening calcification of the aortic valve. Moderate to severe calcifications of the mitral annulus, particularly inferiorly. Mediastinum/Lymph Nodes: No pathologically enlarged mediastinal or hilar lymph nodes. In the mid esophagus (axial image 75 of series 15) there is a well-defined 1.0 x 0.7 cm high attenuation structure, which could represent an esophageal varix, or could represent a duplication cyst containing proteinaceous/calcific content. No axillary lymphadenopathy. Lungs/Pleura: A few scattered small pulmonary nodules are noted, largest of which measures only 5 mm in the posterior aspect of the right upper lobe (axial image 32 of series 16). No other larger more suspicious appearing pulmonary nodules or masses are noted. Bilateral apical pleuroparenchymal thickening and architectural distortion (left greater than right), most compatible with chronic post infectious or inflammatory scarring. Linear scarring also noted in the basal segments of the left lower lobe. Trace bilateral pleural effusions lying dependently. Musculoskeletal/Soft Tissues: There are no aggressive appearing lytic or blastic lesions noted in the visualized portions of the skeleton. Median sternotomy wires. CTA ABDOMEN AND PELVIS FINDINGS Hepatobiliary: No suspicious cystic or solid hepatic lesions. No intra or extrahepatic biliary ductal dilatation. Gallbladder is unremarkable in appearance. Pancreas: No pancreatic mass. No pancreatic ductal dilatation. No pancreatic or peripancreatic fluid collections or inflammatory changes. Spleen:  Unremarkable. Adrenals/Urinary Tract: Bilateral kidneys and adrenal glands are unremarkable in appearance. No hydroureteronephrosis. Urinary bladder is unremarkable in appearance. Stomach/Bowel: Normal appearance of the stomach. No pathologic dilatation of small bowel or colon. The appendix is not confidently identified and may be surgically absent. Regardless, there are no inflammatory changes noted adjacent to the cecum to suggest the presence of an acute appendicitis at this time. Vascular/Lymphatic: Aortic atherosclerosis with vascular findings and measurements pertinent to potential TAVR procedure, as detailed below. No aneurysm or dissection noted in the abdominal or pelvic vasculature. No lymphadenopathy noted in the abdomen or pelvis. Reproductive: Uterus and ovaries are atrophic. In the uterus there   is a 2.4 cm densely calcified lesion (axial image 179 of series 15), presumably a calcified fibroid. Other: No significant volume of ascites.  No pneumoperitoneum. Musculoskeletal: Chronic appearing compression fracture of L2 with 30% loss of anterior vertebral body height. There are no aggressive appearing lytic or blastic lesions noted in the visualized portions of the skeleton. VASCULAR MEASUREMENTS PERTINENT TO TAVR: AORTA: Minimal Aortic Diameter-9 x 11 mm Severity of Aortic Calcification-moderate RIGHT PELVIS: Right Common Iliac Artery - Minimal Diameter-6.3 x 3.7 mm Tortuosity - mild Calcification-moderate to severe Right External Iliac Artery - Minimal Diameter-5.6 x 5.8 mm Tortuosity - mild Calcification-mild Right Common Femoral Artery - Minimal Diameter-5.0 x 3.2 mm Tortuosity - mild Calcification-moderate LEFT PELVIS: Left Common Iliac Artery - Minimal Diameter-5.8 x 6.7 mm Tortuosity - mild Calcification-moderate Left External Iliac Artery - Minimal Diameter-6.7 x 6.1 mm Tortuosity - mild Calcification-mild Left Common Femoral Artery - Minimal Diameter-5.6 x 5.0 mm Tortuosity-mild Calcification-mild  to moderate Review of the MIP images confirms the above findings. IMPRESSION: 1. Vascular findings and measurements pertinent to potential TAVR procedure, as detailed above. 2. Severe thickening calcification of the aortic valve, compatible with the reported clinical history of severe aortic stenosis. 3. 1.0 x 0.7 cm high attenuation structure in the mid esophagus which may represent a large esophageal varix, or could simply represent a duplication cyst containing proteinaceous or calcific content. 4. Small pulmonary nodules in the lungs bilaterally measuring 5 mm or less in size, nonspecific, but statistically likely benign. No follow-up needed if patient is low-risk (and has no known or suspected primary neoplasm). Non-contrast chest CT can be considered in 12 months if patient is high-risk. This recommendation follows the consensus statement: Guidelines for Management of Incidental Pulmonary Nodules Detected on CT Images: From the Fleischner Society 2017; Radiology 2017; 284:228-243. 5. Cardiomegaly with biatrial dilatation (right greater than left). 6. Aortic atherosclerosis, in addition to left main and 2 vessel coronary artery disease. Status post median sternotomy for CABG including LIMA to the LAD. 7. Additional incidental findings, as above. Electronically Signed   By: Daniel  Entrikin M.D.   On: 09/05/2019 12:37   VAS US CAROTID  Result Date: 09/03/2019 Carotid Arterial Duplex Study Indications:      Pre-Op TAVR. Risk Factors:     Hypertension, hyperlipidemia, Diabetes, coronary artery                   disease. Other Factors:    Aortic valve disease, CKD III. Comparison Study: No prior study on file Performing Technologist: Candace Kanady RVS  Examination Guidelines: A complete evaluation includes B-mode imaging, spectral Doppler, color Doppler, and power Doppler as needed of all accessible portions of each vessel. Bilateral testing is considered an integral part of a complete examination. Limited  examinations for reoccurring indications may be performed as noted.  Right Carotid Findings: +----------+--------+--------+--------+------------------+------------------+           PSV cm/sEDV cm/sStenosisPlaque DescriptionComments           +----------+--------+--------+--------+------------------+------------------+ CCA Prox  57      7                                                    +----------+--------+--------+--------+------------------+------------------+ CCA Distal59      13                                  intimal thickening +----------+--------+--------+--------+------------------+------------------+ ICA Prox  53      14              calcific                             +----------+--------+--------+--------+------------------+------------------+ ICA Distal53      12                                                   +----------+--------+--------+--------+------------------+------------------+ ECA       49      2                                                    +----------+--------+--------+--------+------------------+------------------+ +----------+--------+-------+--------+-------------------+           PSV cm/sEDV cmsDescribeArm Pressure (mmHG) +----------+--------+-------+--------+-------------------+ Subclavian90                                         +----------+--------+-------+--------+-------------------+ +---------+--------+--+--------+--+ VertebralPSV cm/s54EDV cm/s12 +---------+--------+--+--------+--+  Left Carotid Findings: +----------+--------+--------+--------+------------------+------------------+           PSV cm/sEDV cm/sStenosisPlaque DescriptionComments           +----------+--------+--------+--------+------------------+------------------+ CCA Prox  60      13                                intimal thickening +----------+--------+--------+--------+------------------+------------------+ CCA Distal53      6                                  intimal thickening +----------+--------+--------+--------+------------------+------------------+ ICA Prox  57      15              calcific                             +----------+--------+--------+--------+------------------+------------------+ ICA Distal67      24                                                   +----------+--------+--------+--------+------------------+------------------+ ECA       81      15                                                   +----------+--------+--------+--------+------------------+------------------+ +----------+--------+--------+--------+-------------------+           PSV cm/sEDV cm/sDescribeArm Pressure (mmHG) +----------+--------+--------+--------+-------------------+ Subclavian100                                         +----------+--------+--------+--------+-------------------+ +---------+--------+--+--------+--+   VertebralPSV cm/s66EDV cm/s13 +---------+--------+--+--------+--+   Summary: Right Carotid: Velocities in the right ICA are consistent with a 1-39% stenosis. Left Carotid: Velocities in the left ICA are consistent with a 1-39% stenosis. Vertebrals:  Bilateral vertebral arteries demonstrate antegrade flow. Subclavians: Normal flow hemodynamics were seen in bilateral subclavian              arteries. *See table(s) above for measurements and observations.  Electronically signed by Brandon Cain MD on 09/03/2019 at 10:42:53 AM.    Final    CT Angio Abd/Pel w/ and/or w/o  Result Date: 09/05/2019 CLINICAL DATA:  86-year-old female with history of severe aortic stenosis. Preprocedural study prior to potential transcatheter aortic valve replacement (TAVR). EXAM: CT ANGIOGRAPHY CHEST, ABDOMEN AND PELVIS TECHNIQUE: Non-contrast CT of the chest was initially obtained. Multidetector CT imaging through the chest, abdomen and pelvis was performed using the standard protocol during bolus administration of  intravenous contrast. Multiplanar reconstructed images and MIPs were obtained and reviewed to evaluate the vascular anatomy. CONTRAST:  80mL OMNIPAQUE IOHEXOL 350 MG/ML SOLN COMPARISON:  No priors. FINDINGS: CTA CHEST FINDINGS Cardiovascular: Heart size is enlarged with biatrial dilatation (right greater than left). There is no significant pericardial fluid, thickening or pericardial calcification. There is aortic atherosclerosis, as well as atherosclerosis of the great vessels of the mediastinum and the coronary arteries, including calcified atherosclerotic plaque in the left main, left anterior descending and left circumflex coronary arteries. Status post median sternotomy for CABG including LIMA to the LAD. Severe thickening calcification of the aortic valve. Moderate to severe calcifications of the mitral annulus, particularly inferiorly. Mediastinum/Lymph Nodes: No pathologically enlarged mediastinal or hilar lymph nodes. In the mid esophagus (axial image 75 of series 15) there is a well-defined 1.0 x 0.7 cm high attenuation structure, which could represent an esophageal varix, or could represent a duplication cyst containing proteinaceous/calcific content. No axillary lymphadenopathy. Lungs/Pleura: A few scattered small pulmonary nodules are noted, largest of which measures only 5 mm in the posterior aspect of the right upper lobe (axial image 32 of series 16). No other larger more suspicious appearing pulmonary nodules or masses are noted. Bilateral apical pleuroparenchymal thickening and architectural distortion (left greater than right), most compatible with chronic post infectious or inflammatory scarring. Linear scarring also noted in the basal segments of the left lower lobe. Trace bilateral pleural effusions lying dependently. Musculoskeletal/Soft Tissues: There are no aggressive appearing lytic or blastic lesions noted in the visualized portions of the skeleton. Median sternotomy wires. CTA ABDOMEN AND  PELVIS FINDINGS Hepatobiliary: No suspicious cystic or solid hepatic lesions. No intra or extrahepatic biliary ductal dilatation. Gallbladder is unremarkable in appearance. Pancreas: No pancreatic mass. No pancreatic ductal dilatation. No pancreatic or peripancreatic fluid collections or inflammatory changes. Spleen: Unremarkable. Adrenals/Urinary Tract: Bilateral kidneys and adrenal glands are unremarkable in appearance. No hydroureteronephrosis. Urinary bladder is unremarkable in appearance. Stomach/Bowel: Normal appearance of the stomach. No pathologic dilatation of small bowel or colon. The appendix is not confidently identified and may be surgically absent. Regardless, there are no inflammatory changes noted adjacent to the cecum to suggest the presence of an acute appendicitis at this time. Vascular/Lymphatic: Aortic atherosclerosis with vascular findings and measurements pertinent to potential TAVR procedure, as detailed below. No aneurysm or dissection noted in the abdominal or pelvic vasculature. No lymphadenopathy noted in the abdomen or pelvis. Reproductive: Uterus and ovaries are atrophic. In the uterus there is a 2.4 cm densely calcified lesion (axial image 179 of series 15), presumably a calcified fibroid.   Other: No significant volume of ascites.  No pneumoperitoneum. Musculoskeletal: Chronic appearing compression fracture of L2 with 30% loss of anterior vertebral body height. There are no aggressive appearing lytic or blastic lesions noted in the visualized portions of the skeleton. VASCULAR MEASUREMENTS PERTINENT TO TAVR: AORTA: Minimal Aortic Diameter-9 x 11 mm Severity of Aortic Calcification-moderate RIGHT PELVIS: Right Common Iliac Artery - Minimal Diameter-6.3 x 3.7 mm Tortuosity - mild Calcification-moderate to severe Right External Iliac Artery - Minimal Diameter-5.6 x 5.8 mm Tortuosity - mild Calcification-mild Right Common Femoral Artery - Minimal Diameter-5.0 x 3.2 mm Tortuosity - mild  Calcification-moderate LEFT PELVIS: Left Common Iliac Artery - Minimal Diameter-5.8 x 6.7 mm Tortuosity - mild Calcification-moderate Left External Iliac Artery - Minimal Diameter-6.7 x 6.1 mm Tortuosity - mild Calcification-mild Left Common Femoral Artery - Minimal Diameter-5.6 x 5.0 mm Tortuosity-mild Calcification-mild to moderate Review of the MIP images confirms the above findings. IMPRESSION: 1. Vascular findings and measurements pertinent to potential TAVR procedure, as detailed above. 2. Severe thickening calcification of the aortic valve, compatible with the reported clinical history of severe aortic stenosis. 3. 1.0 x 0.7 cm high attenuation structure in the mid esophagus which may represent a large esophageal varix, or could simply represent a duplication cyst containing proteinaceous or calcific content. 4. Small pulmonary nodules in the lungs bilaterally measuring 5 mm or less in size, nonspecific, but statistically likely benign. No follow-up needed if patient is low-risk (and has no known or suspected primary neoplasm). Non-contrast chest CT can be considered in 12 months if patient is high-risk. This recommendation follows the consensus statement: Guidelines for Management of Incidental Pulmonary Nodules Detected on CT Images: From the Fleischner Society 2017; Radiology 2017; 284:228-243. 5. Cardiomegaly with biatrial dilatation (right greater than left). 6. Aortic atherosclerosis, in addition to left main and 2 vessel coronary artery disease. Status post median sternotomy for CABG including LIMA to the LAD. 7. Additional incidental findings, as above. Electronically Signed   By: Daniel  Entrikin M.D.   On: 09/05/2019 12:37     STS Risk Calculator: Procedure: AV Replacement (isolated AVR)  Risk of Mortality: 8.906% Renal Failure: 4.665% Permanent Stroke: 5.407% Prolonged Ventilation: 16.793% DSW Infection: 0.125% Reoperation: 4.212% Morbidity or Mortality: 22.712% Short Length  of Stay: 16.316% Long Length of Stay: 13.058%    Assessment and Plan:   Nancy Mcguire is a 86 y.o. female with symptoms of severe, stage D1 aortic stenosis with NYHA Class IV symptoms who is currently admitted with acute heart failure requiring IV diuresis.  I have reviewed the patient's recent echocardiogram which is notable for preserved LV systolic function and severe aortic stenosis with peak gradient of 87.6 mm Hg and mean transvalvular gradient of 51.0 mmHG. The patient's dimensionless index is 0.14 and calculated aortic valve area is 0.39 cm. L/RHC showed 2 out of 3 patent bypass grafts in a left dominant system.  The vein graft to the diagonal branch is occluded at the origin.  The LIMA to the LAD is patent with significant proximal LAD disease proximal to the LIMA insertion and the vein graft to the circumflex has moderate disease in the mid to distal shaft.  The RCA is nondominant.  She was found to have severe aortic stenosis with an AVA of 0.6cm2 and an LVEDP of 18. Cardiac gated CTA of the heart which revealed anatomical characteristics consistent with severe aortic stenosis suitable for treatment by transcatheter aortic valve replacement without any significant complicating feature. CTA of the aorta and iliac vessels   demonstrate what appear to be adequate pelvic vascular access to facilitate a transfemoral approach (potentially on left side). Unfortunately, the patient developed acute kidney injury with a creat peaking at 4.95 felt to mostly likely be related to contrast induced nephropathy with possible ARB effect. Creatinine has improved to 2.88 today.   The patient's predicted risk of mortality with conventional aortic valve replacement is 8.906% primarily based on age, CAD with remote bypass surgery, acute heart failure, type 1 diabetes on an insulin pump, hypertension. Other significant comorbid conditions include severe pulmonary hypertension and recent AKI likely 2/2 contrast  induced nephropathy. Alternative approaches such as conventional aortic valve replacement, transcatheter aortic valve replacement, and continued medical therapy without intervention were compared and contrasted at length.  The risks associated with conventional surgical aortic valve replacement were discussed in detail, as were expectations for post-operative convalescence, and why we would be reluctant to consider this patient a candidate for conventional surgery. Issues specific to transcatheter aortic valve replacement were discussed including questions about long term valve durability, the potential for paravalvular leak, possible increased risk of need for permanent pacemaker placement, and other technical complications related to the procedure itself. Long-term prognosis with medical therapy was discussed. This discussion was placed in the context of the patient's own specific clinical presentation and past medical history.  All of their questions have been addressed.  The patient hopes to proceed with transcatheter aortic valve replacement in the near future. She understands we need to let her renal function recover before proceeding with TAVR. Tentative plan would be to discharge her to a short term rehab with plans for TAVR on 09/20/19.  Dr. Raheel Kunkle to follow.    Signed, Kathryn Thompson, PA-C  09/06/2019 12:20 PM    I have seen and examined the patient and agree with the assessment and plan as outlined above by Kathryn Thompson, PA-C  Patient is an 86-year-old female with history of aortic stenosis, coronary artery disease status post coronary artery bypass grafting in the remote past, stage III chronic kidney disease, hypertension, hyperlipidemia, type 1 diabetes mellitus, and pulmonary hypertension who was admitted with acute exacerbation of chronic diastolic congestive heart failure.  I have personally reviewed the patient's recent transthoracic echocardiogram, diagnostic cardiac catheterization,  CT angiograms, and EKG.  Echocardiogram reveals preserved left ventricular systolic function with severe aortic stenosis.  The aortic valve is trileaflet with severe thickening, calcification, and restricted leaflet mobility involving all 3 leaflets.  Peak velocity across aortic valve measured close to 4.7 m/s corresponding to mean transvalvular gradient estimated greater than 50 mmHg.  Diagnostic cardiac catheterization revealed severe native coronary artery disease with left dominant coronary circulation but continued patency of 2 of 3 previously placed bypass grafts performed at the time of the patient's coronary artery bypass surgery in the remote past.  I agree the patient would benefit from aortic valve replacement.  However, I would not consider this elderly patient a candidate for conventional surgery under any circumstances because of her advanced age, previous coronary artery bypass surgery, and numerous comorbid medical problems.  Cardiac-gated CTA of the heart reveals anatomical characteristics consistent with aortic stenosis suitable for treatment by transcatheter aortic valve replacement without any significant complicating features and CTA of the aorta and iliac vessels demonstrate what appears to be adequate pelvic vascular access to facilitate a transfemoral approach.  The patient hopes to proceed with TAVR in the near future once her renal function has normalized.  Following the decision to proceed with transcatheter aortic valve   replacement, a discussion has been held regarding what types of management strategies would be attempted intraoperatively in the event of life-threatening complications, including whether or not the patient would be considered a candidate for the use of cardiopulmonary bypass and/or conversion to open sternotomy for attempted surgical intervention.  The patient specifically requests that should a potentially life-threatening complication develop we would not attempt  emergency median sternotomy and/or other aggressive surgical procedures.  The patient has been advised of a variety of complications that might develop including but not limited to risks of death, stroke, paravalvular leak, aortic dissection or other major vascular complications, aortic annulus rupture, device embolization, cardiac rupture or perforation, mitral regurgitation, acute myocardial infarction, arrhythmia, heart block or bradycardia requiring permanent pacemaker placement, congestive heart failure, respiratory failure, renal failure, pneumonia, infection, other late complications related to structural valve deterioration or migration, or other complications that might ultimately cause a temporary or permanent loss of functional independence or other long term morbidity.    At this point the timing of surgery remains unclear and will depend upon the patient's continued recovery from presumed contrast-induced acute kidney injury.  Under the circumstances it might be reasonable to tentatively plan for surgery in approximately 2 weeks.  She will need close follow up once she is felt stable enough to be discharged from the hospital.   I spent in excess of 60 minutes during the conduct of this hospital encounter and >50% of this time involved direct face-to-face encounter with the patient for counseling and/or coordination of their care.   Kycen Spalla H Kamaree Berkel, MD 09/06/2019 5:40 PM    

## 2019-09-07 ENCOUNTER — Inpatient Hospital Stay (HOSPITAL_COMMUNITY): Payer: Medicare Other

## 2019-09-07 DIAGNOSIS — J810 Acute pulmonary edema: Secondary | ICD-10-CM

## 2019-09-07 LAB — URINE CULTURE: Culture: >=100000 — AB

## 2019-09-07 LAB — BASIC METABOLIC PANEL
Anion gap: 12 (ref 5–15)
Anion gap: 12 (ref 5–15)
BUN: 43 mg/dL — ABNORMAL HIGH (ref 8–23)
BUN: 35 mg/dL — ABNORMAL HIGH (ref 8–23)
CO2: 22 mmol/L (ref 22–32)
CO2: 22 mmol/L (ref 22–32)
Calcium: 8.2 mg/dL — ABNORMAL LOW (ref 8.9–10.3)
Calcium: 7.9 mg/dL — ABNORMAL LOW (ref 8.9–10.3)
Chloride: 102 mmol/L (ref 98–111)
Chloride: 102 mmol/L (ref 98–111)
Creatinine, Ser: 1.82 mg/dL — ABNORMAL HIGH (ref 0.44–1.00)
Creatinine, Ser: 1.5 mg/dL — ABNORMAL HIGH (ref 0.44–1.00)
GFR calc Af Amer: 28 mL/min — ABNORMAL LOW (ref >60)
GFR calc Af Amer: 36 mL/min — ABNORMAL LOW (ref >60)
GFR calc non Af Amer: 25 mL/min — ABNORMAL LOW (ref >60)
GFR calc non Af Amer: 31 mL/min — ABNORMAL LOW (ref >60)
Glucose, Bld: 241 mg/dL — ABNORMAL HIGH (ref 70–99)
Glucose, Bld: 237 mg/dL — ABNORMAL HIGH (ref 70–99)
Potassium: 3.6 mmol/L (ref 3.5–5.1)
Potassium: 3.4 mmol/L — ABNORMAL LOW (ref 3.5–5.1)
Sodium: 136 mmol/L (ref 135–145)
Sodium: 136 mmol/L (ref 135–145)

## 2019-09-07 LAB — GLUCOSE, CAPILLARY
Glucose-Capillary: 246 mg/dL — ABNORMAL HIGH (ref 70–99)
Glucose-Capillary: 192 mg/dL — ABNORMAL HIGH (ref 70–99)
Glucose-Capillary: 248 mg/dL — ABNORMAL HIGH (ref 70–99)
Glucose-Capillary: 243 mg/dL — ABNORMAL HIGH (ref 70–99)

## 2019-09-07 MED ORDER — FUROSEMIDE 10 MG/ML IJ SOLN
INTRAMUSCULAR | Status: AC
  Filled 2019-09-07: qty 8

## 2019-09-07 MED ORDER — INSULIN GLARGINE 100 UNIT/ML ~~LOC~~ SOLN
15.0000 [IU] | Freq: Every day | SUBCUTANEOUS | Status: DC
  Administered 2019-09-07 – 2019-09-10 (×4): 15 [IU] via SUBCUTANEOUS
  Filled 2019-09-07 (×5): qty 0.15

## 2019-09-07 MED ORDER — METOPROLOL TARTRATE 5 MG/5ML IV SOLN
2.5000 mg | INTRAVENOUS | Status: AC | PRN
  Administered 2019-09-07: 2.5 mg via INTRAVENOUS

## 2019-09-07 MED ORDER — FUROSEMIDE 10 MG/ML IJ SOLN
40.0000 mg | Freq: Once | INTRAMUSCULAR | Status: AC
  Administered 2019-09-07: 40 mg via INTRAVENOUS
  Filled 2019-09-07: qty 4

## 2019-09-07 MED ORDER — METOPROLOL TARTRATE 5 MG/5ML IV SOLN
INTRAVENOUS | Status: AC
  Filled 2019-09-07: qty 5

## 2019-09-07 MED ORDER — FUROSEMIDE 10 MG/ML IJ SOLN
20.0000 mg | Freq: Once | INTRAMUSCULAR | Status: AC
  Administered 2019-09-07: 20 mg via INTRAVENOUS

## 2019-09-07 MED ORDER — INSULIN ASPART 100 UNIT/ML ~~LOC~~ SOLN
4.0000 [IU] | Freq: Three times a day (TID) | SUBCUTANEOUS | Status: DC
  Administered 2019-09-08 – 2019-09-10 (×6): 4 [IU] via SUBCUTANEOUS

## 2019-09-07 NOTE — Progress Notes (Signed)
Patient has been on 4L nasal cannula since 11:15.  Around 1900 patient became visibly short of breath with sats dropping to low 90's on 6L and RR 35-40.  BBS fine crackles.  Patient placed back on BiPap at this time. RN at bedside.

## 2019-09-07 NOTE — Progress Notes (Signed)
   09/07/19 0056  Assess: MEWS Score  Temp  (Unable to obtain pt breathing through mouth)  BP (!) 163/93  Pulse Rate (!) 123  Resp (!) 36  Level of Consciousness Alert  SpO2 (!) 87 %  O2 Device Nasal Cannula  O2 Flow Rate (L/min) 6 L/min  Assess: MEWS Score  MEWS Temp 0  MEWS Systolic 0  MEWS Pulse 2  MEWS RR 3  MEWS LOC 0  MEWS Score 5  MEWS Score Color Red  Assess: if the MEWS score is Yellow or Red  Were vital signs taken at a resting state? Yes  Focused Assessment Documented focused assessment  Early Detection of Sepsis Score *See Row Information* Medium  MEWS guidelines implemented *See Row Information* Yes  Treat  MEWS Interventions Escalated (See documentation below)  Take Vital Signs  Increase Vital Sign Frequency  Red: Q 1hr X 4 then Q 4hr X 4, if remains red, continue Q 4hrs  Escalate  MEWS: Escalate Red: discuss with charge nurse/RN and provider, consider discussing with RRT  Notify: Charge Nurse/RN  Name of Charge Nurse/RN Notified [place]  Date Charge Nurse/RN Notified 09/07/19  Time Charge Nurse/RN Notified 0056  Notify: Provider  Provider Name/Title Katherina Right  Date Provider Notified 09/07/19  Time Provider Notified 0104  Notification Type Page  Notification Reason Change in status  Response See new orders  Date of Provider Response 09/07/19  Time of Provider Response 0120  Notify: Rapid Response  Name of Rapid Response RN Notified David  Date Rapid Response Notified 09/07/19  Time Rapid Response Notified 0001  Document  Patient Outcome Stabilized after interventions  Progress note created (see row info) Yes

## 2019-09-07 NOTE — Progress Notes (Signed)
Bed weight done on pt due to SOB and BIPAP.

## 2019-09-07 NOTE — Progress Notes (Signed)
Pt had not urinated since receiving lasix. Pt bladder scanned. shown. Provider called. In & Out cath ordered. 500 ml removed.

## 2019-09-07 NOTE — Progress Notes (Signed)
Pt called out stating she was having trouble breathing. Pt HR in the 120s RR 36. Unable to speak in complete sentences. PT place on [place] 3L. Sats not improving. Rapid Response nurse called to bedside to see pt. Placed on 6L. Sats 89.Stat EKG and CXR ordered and obtained. On call provider notified and will come to the bedside to see patient. New orders 2.5mg  metoprolol and 20 mg IV lasix given. Pt breathing through mouth and sats still in the 80s. Placed on nonrebreather mask. sats holding 89. Pt placed on BIPAP.

## 2019-09-07 NOTE — Progress Notes (Signed)
RT NOTES: Tried patient off bipap. Patient said it was too much of an effort to breathe. Placed back on bipap at this time. Tolerating well.

## 2019-09-07 NOTE — Progress Notes (Signed)
Inpatient Diabetes Program Recommendations  AACE/ADA: New Consensus Statement on Inpatient Glycemic Control (2015)  Target Ranges:  Prepandial:   less than 140 mg/dL      Peak postprandial:   less than 180 mg/dL (1-2 hours)      Critically ill patients:  140 - 180 mg/dL   Lab Results  Component Value Date   GLUCAP 246 (H) 09/07/2019   HGBA1C 7.5 (H) 09/06/2019    Review of Glycemic Control  Results for Nancy Mcguire, Nancy Mcguire (MRN [id number]) as of 09/07/2019 09:57  Ref. Range 09/06/2019 06:22 09/06/2019 11:17 09/06/2019 16:15 09/06/2019 21:08 09/07/2019 06:18  Glucose-Capillary Latest Ref Range: 70 - 99 mg/dL 010 (H) 272 (H) 536 (H) 307 (H) 246 (H)   Diabetes history:  DM1(does not make insulin.  Needs correction, basal and meal coverage)  Outpatient Diabetes medications:  Insulin pump   Current orders for Inpatient glycemic control:  Lantus 13 units daily  Novolog 0-6 tid with meals Novolog 0-5 qhs  Novolog 2 units tid with meals  Inpatient Diabetes Program Recommendations:     Lantus 16 units daily  Novolog 4 units tid with meals if eats at least 50% of meal and cbg > 80 mg/dl  Thank you, Dulce Sellar, RN, BSN Diabetes Coordinator Inpatient Diabetes Program [phone] (team pager from 8a-5p)

## 2019-09-07 NOTE — Progress Notes (Addendum)
2050 - Pt placed back on nasal cannula 4L to have bedtime medications. Tolerated well. O2 sat 94%. 2125 - Pt placed back on BIPAP. O2 sat 99%.    0110 - Pt requested to take a break from BIPAP. O2 sat 100%. Placed on [place]. 0130 - O2 sat 89%, placed back on BIPAP. Now 96%.   0630 - Placed on [place] 4L for morning meds. O2 sat 92%. At 0710, O2 sat drop to 84% - put back on BIPAP. O2 now 98%. Continuing to monitor.

## 2019-09-07 NOTE — Progress Notes (Signed)
Husband updated on patient's condition.

## 2019-09-07 NOTE — Progress Notes (Signed)
RT called by rapid to place pt on bipap due to SOB. Pt now on bipap and is resting comfortably. RT will continue to monitor throughout night.

## 2019-09-07 NOTE — Significant Event (Addendum)
Rapid Response Event Note  Overview: Afib with RVR and SOB  Initial Focused Assessment: Nursing staff notified me of pt in respiratory distress. Upon arrival, Ms. Heinle is in significant respiratory distress with retractions and abdominal accessory muscle use. RR 28. BBS crackles. +JVD. EKG shows afib with RVR. Placed on NRB mask at 15L and sats 90-91%. Marikay Alar APP notified and orders received. Stat PCXR shows worsening CHF per Dr. Elvera Maria. Lasix ordered. Pt placed on BIPAP due to low sats 83% and increased WOB.   Interventions: -Stat PCXR -Stat EKG -Lasix 20 mg now  Plan of Care (if not transferred): -Notify primary svc for further orders -Notify primary svc and/or RRRN for further assistance -Wean 02 as tolerated for sats > or = 92%   Event Summary: Call 0055 Arrived 0100 Call ended 0155   Follow up 0430: On Bipap, RR 18, TV 670. Reported 500cc I&O Cath. Resting well. Wean off Bipap when awake in am.    Rose Fillers

## 2019-09-07 NOTE — Progress Notes (Signed)
Progress Note  Patient Name: Nancy Mcguire Date of Encounter: 09/07/2019  Primary Cardiologist: Nanetta Batty, MD   Subjective   Was placed on bipap overnight for low O2 sats, tachypnea, increased work of breathing. Was tachycardic and hypotensive at the time. She received 20 ml of lasix--did not urinate but 500cc removed by in and out cath.  She has made more urine this AM, and Cr down to 1.82. However, has been unable to wean from bipap. She feels poorly this AM. Discussed events, next steps with daughter at bedside.  Inpatient Medications    Scheduled Meds: . aspirin  81 mg Oral Daily  . heparin injection (subcutaneous)  5,000 Units Subcutaneous Q8H  . insulin aspart  0-5 Units Subcutaneous QHS  . insulin aspart  0-6 Units Subcutaneous TID WC  . insulin aspart  2 Units Subcutaneous TID WC  . insulin glargine  13 Units Subcutaneous Q1400  . levothyroxine  50 mcg Oral QAC breakfast  . NIFEdipine  30 mg Oral Q12H  . senna-docusate  1 tablet Oral BID  . simvastatin  20 mg Oral q1800  . sodium bicarbonate  650 mg Oral TID  . sodium chloride flush  3 mL Intravenous Q12H   Continuous Infusions: . sodium chloride    . cefTRIAXone (ROCEPHIN)  IV 1 g (09/06/19 1139)   PRN Meds: sodium chloride, acetaminophen, lactulose, ondansetron (ZOFRAN) IV, sodium chloride flush   Vital Signs    Vitals:   09/07/19 0600 09/07/19 0700 09/07/19 0734 09/07/19 0737  BP: 121/71 111/68 117/63 117/63  Pulse:  91  95  Resp: (!) 24 (!) 26  (!) 23  Temp: (!) 97.3 F (36.3 C)  97.6 F (36.4 C)   TempSrc: Oral  Oral   SpO2: 95% 99%  97%  Weight:      Height:        Intake/Output Summary (Last 24 hours) at 09/07/2019 0943 Last data filed at 09/07/2019 0804 Gross per 24 hour  Intake 940 ml  Output 1450 ml  Net -510 ml   Last 3 Weights 09/07/2019 09/06/2019 09/05/2019  Weight (lbs) 119 lb 0.8 oz 117 lb 6.4 oz 118 lb 12.8 oz  Weight (kg) 54 kg 53.252 kg 53.887 kg      Telemetry    NSR,  with gradual increase in heart rate overnight during event. Appears on telemetry more consistent with sinus tachycardia with PACs - Personally Reviewed  ECG    Read as afib RVR. Based on ECG and telemetry, appears more consistent with sinus tachycardia with PACs - Personally Reviewed  Physical Exam   GEN: In bed, bipap in place HEENT: Normal, moist mucous membranes NECK: No JVD visible at 90 degrees CARDIAC: regular rhythm, normal S1 and S2, no rubs or gallops. 3/6 SE murmur. VASCULAR: Radial and DP pulses 2+ bilaterally.  RESPIRATORY:  Clear in upper fields, but crackles mid to lower lung fields bilaterally, R>L ABDOMEN: Soft, non-tender, non-distended MUSCULOSKELETAL:  Moves all 4 limbs independently SKIN: Warm and dry, no edema NEUROLOGIC:  Alert and oriented x 3. No focal neuro deficits noted. PSYCHIATRIC:  Normal affect   Labs    High Sensitivity Troponin:   Recent Labs  Lab 08/23/2019 1220 08/14/2019 1539  TROPONINIHS 21* 26*      Chemistry Recent Labs  Lab 09/05/19 0645 09/06/19 0816 09/07/19 0550  NA 130* 135 136  K 3.6 2.8* 3.6  CL 98 100 102  CO2 18* 22 22  GLUCOSE 205* 226* 241*  BUN  64* 58* 43*  CREATININE 4.95* 2.88* 1.82*  CALCIUM 7.9* 8.1* 8.2*  ALBUMIN  --  2.9*  --   GFRNONAA 7* 14* 25*  GFRAA 8* 16* 28*  ANIONGAP 14 13 12      Hematology Recent Labs  Lab 09/03/19 0802 09/04/19 0728 09/06/19 0816  WBC 9.1 8.1 6.0  RBC 3.31* 3.20* 3.56*  HGB 9.8* 9.5* 10.4*  HCT 31.4* 30.1* 32.3*  MCV 94.9 94.1 90.7  MCH 29.6 29.7 29.2  MCHC 31.2 31.6 32.2  RDW 15.9* 15.6* 14.9  PLT 191 195 260    BNP No results for input(s): BNP, PROBNP in the last 168 hours.   DDimer No results for input(s): DDIMER in the last 168 hours.   Radiology    DG Chest Port 1 View  Result Date: 09/07/2019 CLINICAL DATA:  Acute respiratory distress EXAM: PORTABLE CHEST 1 VIEW COMPARISON:  CT 08/21/2019 FINDINGS: There is chronic elevation of the left hemidiaphragm likely  with adjacent atelectatic changes. There are diffusely increasing hazy and reticular opacities throughout both lungs. Suspect small effusions well. The pulmonary vascularity is indistinct. Cardiomediastinal contours are similar to prior with evidence of prior CABG including mediastinal surgical clips and sternotomy sutures. Atherosclerotic calcification of the thoracic aorta. No acute osseous or soft tissue abnormality. Degenerative changes are present in the imaged spine and shoulders. Telemetry leads overlie the chest. IMPRESSION: 1. Diffuse bilateral hazy and reticular opacities throughout both lungs with some indistinctness of the pulmonary vascularity. Findings are suggestive of worsening CHF/volume overload with edema. Trace effusions are likely present as well. 2. Chronic elevation of the left hemidiaphragm likely with adjacent atelectatic changes. Electronically Signed   By: Lovena Le M.D.   On: 09/07/2019 01:31    Cardiac Studies   Cath 09/07/2019  Prox LAD to Mid LAD lesion is 90% stenosed.  Mid LAD-1 lesion is 90% stenosed.  Mid LAD-2 lesion is 90% stenosed.  Origin to Prox Graft lesion is 100% stenosed.  Ost Cx to Prox Cx lesion is 90% stenosed.  2nd Mrg lesion is 90% stenosed.  Mid Graft to Dist Graft lesion is 60% stenosed.  Prox Graft to Mid Graft lesion is 40% stenosed.  Hemodynamic findings consistent with aortic valve stenosis.  IMPRESSION: Ms. Amoroso has 2 of 3 grafts patent in the left dominant system.  The vein graft to the diagonal branch is occluded at the origin.  The LIMA to the LAD is patent with significant proximal LAD disease proximal to LIMA insertion.  The vein graft to the circumflex has moderate disease in the mid and distal shaft.  The RCA is nondominant.  She has severe aortic stenosis with a valve area between 0.6 1.8 by Fick or thermal dilution and EDP of 18.  I did shoot her distal abdominal aorta revealing widely patent iliacs arteries.  I reviewed  the films with Dr. Angelena Form who feels that she is a acceptable candidate for TAVR.  I performed angiography of the right common femoral artery and and successfully deployed a MYNX closure device in the right common femoral artery and vein achieving hemostasis.  The patient left lab in stable condition.  Hemodynamic findings consistent with aortic valve stenosis. Right atrial pressure-5/7 Right ventricular pressure 56 systolic Pulmonary artery pressure-54/17, mean 31 Pulmonary wedge pressure-A-wave 14, V wave 15, mean 12 LVEDP-18 Strict cardiac output-4.9 L/min with an index of 3.3 L/min/m by Fick, 5.6 L/min with an index of 4.4 L/min/m by thermodilution Aortic valve area-0.81 cm by thermodilution, 0.61 cm  by Hiram Comber  Echo 08/30/19 1. Left ventricular ejection fraction, by estimation, is 60 to 65%. The  left ventricle has normal function. The left ventricle has no regional  wall motion abnormalities. There is mild left ventricular hypertrophy.  Left ventricular diastolic parameters  are consistent with Grade II diastolic dysfunction (pseudonormalization).  Elevated left ventricular end-diastolic pressure.  2. Right ventricular systolic function is mildly reduced. The right  ventricular size is normal. There is severely elevated pulmonary artery  systolic pressure. The estimated right ventricular systolic pressure is  82.2 mmHg.  3. Left atrial size was mildly dilated.  4. Right atrial size was mildly dilated.  5. The mitral valve is abnormal. Mild to moderate mitral valve  regurgitation.  6. Tricuspid valve regurgitation is mild to moderate.  7. The aortic valve is tricuspid. Aortic valve regurgitation is trivial.  Severe aortic valve stenosis. Aortic valve area, by VTI measures 0.36 cm.  Aortic valve mean gradient measures 51.0 mmHg. Aortic valve Vmax measures  4.68 m/s.  8. The inferior vena cava is dilated in size with <50% respiratory  variability, suggesting right atrial  pressure of 15 mmHg.   Comparison(s): Changes from prior study are noted. 01/13/2019: LVEF 60-65%,  severe AS - mean gradient 43 mmHg.    Patient Profile     86 y.o. female with PMH of HTN, CAD (CABG X3 26 yrs ago), DM-1(on insulin pump) and severe aortic stenosis. Admitted for acute on chronic diastolic heart failure exacerbation, complicated by acute kidney injury. Episode of flash pulmonary edema overnight 5/25-5/26.  Assessment & Plan    Acute on chronic diastolic heart failure, complicated by severe aortic stenosis, pulmonary hypertension, acute kidney injury -likely episode of flash pulmonary edema overnight -remains on bipap. With improvement in kidney function and increase in urine output, will given 40 mg IV lasix today due to pulmonary edema. Will check BMET in PM to monitor.  -with improving Cr, improved symptoms, may be able to go to skilled nursing facility possibly as early as tomorrow from a cardiac standpoint -weight today 54 kg, admission weight 54.4 kg.  -Cr today 1.82, down from 4.95 peak -TAVR planned as outpatient  Acute kidney injury:  -appreciate nephrology evaluation -suspected 2/2 contrast nephropathy given two CT scans and cath -Cr downtrended from 4.95 to 1.82 today -now being treated for UTI  History of CAD s/p prior CABG: -medical management with statin -on aspirin 81 mg daily  Type I diabetes, on insulin pump: -per primary team  Hypertension: BP has been running lower generally, but had episode of hypertension and flash pulmonary edema overnight -holding home losartan 100 mg daily given AKI -holding home metoprolol 50 mg BID. Heart rate has been generally well controlled other than overnight episode -home nifedipine 60 mg daily split to 30 mg q12hours with hold parameters per nephrology 5/23  Hypothyroidism: -on levothyroxine  For questions or updates, please contact [facility] Please consult [email or web address] for contact info under       Signed, Jodelle Red, MD  09/07/2019, 9:43 AM

## 2019-09-07 NOTE — Progress Notes (Signed)
PROGRESS NOTE    Nancy Mcguire  ZOX:[id number] DOB: 26-Mar-1930 DOA: Sep 12, 2019 PCP: Angelica Chessman, MD   Brief Narrative: 86 year old with past medical history significant for diastolic heart failure not on diuretics, moderate to severe pulmonary hypertension, severe aortic stenosis, heart murmur, CAD, carotid artery stenosis, CKD stage III AAA, diabetes type 2 on insulin pump, hypothyroidism, chronic back pain who presented with worsening shortness of breath and dry cough. Patient was admitted with acute on chronic diastolic heart failure exacerbation, chest x-ray showed cardiomegaly and small bilateral pleural effusion, CTA negative for PE, BNP 1024.  Patient acute heart failure exacerbation was treated with IV Laxis, subsequently she was transitioned to oral Lasix.  She underwent evaluation for aortic valve replacement (TAVAR), cardiac cath and CT chest with contrast.  Hospital course complicated by acute renal failure secondary to contrast nephropathy.  Patient received IV fluid transiently.  Creatinine continued to increase, nephrology consulted.  Today patient kidney function slightly improved, creatinine decreased from 4.8-2.8. Cardiology planning TAVAR  in 2 weeks (June 8).    Assessment & Plan:   Acute on chronic diastolic heart failure  Acute on chronic hypoxic respiratory failure severe pulmonary hypertension Severe aortic stenosis Echo with ejection fraction 60 to 65%, severe aortic valve stenosis -Diuresed with IV Lasix and then diuretics were held in the setting of AKI -Developed flash pulmonary edema last night, now on BiPAP -Lasix this morning, attempt to discontinue BiPAP -Cardiology following -Monitor I's/O, daily weights  Aortic Valve Severe Stenosis:  CAD/CABG  cardiology consulting Cardiology has consulted TAVR team for further evaluation. Had Cath: LIMA and Circumflex graft open, diagonal graft with obstruction.  Underwent  CT scans, CVTS sx consulted , TVAR  will be delayed until June 8 due to AKI  Acute on CKD stage III a; Contrast nephropathy -Creatinine peaked at 4.9 -Now improving -Was briefly hydrated with IVs fluids as well, renal ultrasound without hydronephrosis -Appreciate nephrology input -Kidney function improving, IV Lasix this morning  E. coli UTI -Day 2 of IV ceftriaxone, changed to oral Keflex tomorrow for 5 days total  Controlled diabetes type 1 with hyperglycemia: -CBGs poorly controlled, increase Lantus and NovoLog meal coverage, resume insulin pump at discharge  Essential hypertension: Continue with losartan and metoprolol.  Hypothyroidism continue with Synthroid.  Hypomagnesemia: Replete.  Constipation: started  senna, lactulose.  Had a bowel movement  Estimated body mass index is 21.77 kg/m as calculated from the following:   Height as of this encounter: 5\' 2"  (1.575 m).   Weight as of this encounter: 54 kg.   DVT prophylaxis: Lovenox Code Status: DNR DNI Family Communication: Daughter at bedside Disposition Plan:  Status is: Inpatient Remains fluid overloaded, developed flash pulmonary edema last night, improving acute kidney injury  Dispo: The patient is from: Home              Anticipated d/c is to: Home               Anticipated d/c date is: 2 or 3 days              Patient currently; not medical stable for discharge        Consultants:   Cardiology  Procedures:   Echo  Antimicrobials:  None  Subjective: -Developed respiratory distress overnight, x-ray with pulmonary edema, started on BiPAP  Objective: Vitals:   09/07/19 0734 09/07/19 0737 09/07/19 1113 09/07/19 1131  BP: 117/63 117/63 122/67 112/66  Pulse:  95 93   Resp:  (!) 23 (!)  37 18  Temp: 97.6 F (36.4 C)   97.9 F (36.6 C)  TempSrc: Oral   Oral  SpO2:  97% 100% 94%  Weight:      Height:        Intake/Output Summary (Last 24 hours) at 09/07/2019 1312 Last data filed at 09/07/2019 1100 Gross per 24 hour  Intake  700 ml  Output 1850 ml  Net -1150 ml   Filed Weights   09/05/19 0331 09/06/19 0300 09/07/19 0302  Weight: 53.9 kg 53.3 kg 54 kg    Examination:  General exam: Frail elderly chronically ill female sitting up in bed, awake alert, BiPAP on Respiratory system: Fine bilateral crackles Cardiovascular system: S1-S2, regular rhythm, loud systolic ejection murmur Gastrointestinal system: Soft, nontender, bowel sounds present  Central nervous system: Awake alert, oriented x2 Extremities: No edema   Data Reviewed: I have personally reviewed following labs and imaging studies  CBC: Recent Labs  Lab 09/08/2019 0558 09/12/2019 1657 08/13/2019 1702 09/02/19 0426 09/03/19 0802 09/04/19 0728 09/06/19 0816  WBC 9.0  --   --  9.4 9.1 8.1 6.0  HGB 11.4*   < > 10.2* 10.7* 9.8* 9.5* 10.4*  HCT 36.1   < > 30.0* 33.1* 31.4* 30.1* 32.3*  MCV 94.8  --   --  95.1 94.9 94.1 90.7  PLT 251  --   --  224 191 195 260   < > = values in this interval not displayed.   Basic Metabolic Panel: Recent Labs  Lab 09/03/19 0802 09/03/19 0802 09/03/19 1058 09/04/19 0728 09/05/19 0645 09/06/19 0816 09/07/19 0550  NA 135  --   --  133* 130* 135 136  K 4.2  --   --  3.7 3.6 2.8* 3.6  CL 99  --   --  101 98 100 102  CO2 22  --   --  22 18* 22 22  GLUCOSE 144*  --   --  191* 205* 226* 241*  BUN 40*  --   --  50* 64* 58* 43*  CREATININE 2.38*   < > 2.69* 3.88* 4.95* 2.88* 1.82*  CALCIUM 8.3*  --   --  7.8* 7.9* 8.1* 8.2*  PHOS  --   --   --   --   --  2.9  --    < > = values in this interval not displayed.   GFR: Estimated Creatinine Clearance: 17.2 mL/min (A) (by C-G formula based on SCr of 1.82 mg/dL (H)). Liver Function Tests: Recent Labs  Lab 09/06/19 0816  ALBUMIN 2.9*   No results for input(s): LIPASE, AMYLASE in the last 168 hours. No results for input(s): AMMONIA in the last 168 hours. Coagulation Profile: No results for input(s): INR, PROTIME in the last 168 hours. Cardiac Enzymes: No  results for input(s): CKTOTAL, CKMB, CKMBINDEX, TROPONINI in the last 168 hours. BNP (last 3 results) No results for input(s): PROBNP in the last 8760 hours. HbA1C: Recent Labs    09/06/19 0816  HGBA1C 7.5*   CBG: Recent Labs  Lab 09/06/19 1117 09/06/19 1615 09/06/19 2108 09/07/19 0618 09/07/19 1129  GLUCAP 342* 276* 307* 246* 192*   Lipid Profile: No results for input(s): CHOL, HDL, LDLCALC, TRIG, CHOLHDL, LDLDIRECT in the last 72 hours. Thyroid Function Tests: No results for input(s): TSH, T4TOTAL, FREET4, T3FREE, THYROIDAB in the last 72 hours. Anemia Panel: No results for input(s): VITAMINB12, FOLATE, FERRITIN, TIBC, IRON, RETICCTPCT in the last 72 hours. Sepsis Labs: No results for input(s):  PROCALCITON, LATICACIDVEN in the last 168 hours.  Recent Results (from the past 240 hour(s))  SARS Coronavirus 2 by RT PCR (hospital order, performed in [facility] hospital lab) Nasopharyngeal Nasopharyngeal Swab     Status: None   Collection Time: 08/16/2019  7:56 PM   Specimen: Nasopharyngeal Swab  Result Value Ref Range Status   SARS Coronavirus 2 NEGATIVE NEGATIVE Final    Comment: (NOTE) SARS-CoV-2 target nucleic acids are NOT DETECTED. The SARS-CoV-2 RNA is generally detectable in upper and lower respiratory specimens during the acute phase of infection. The lowest concentration of SARS-CoV-2 viral copies this assay can detect is 250 copies / mL. A negative result does not preclude SARS-CoV-2 infection and should not be used as the sole basis for treatment or other patient management decisions.  A negative result may occur with improper specimen collection / handling, submission of specimen other than nasopharyngeal swab, presence of viral mutation(s) within the areas targeted by this assay, and inadequate number of viral copies (<250 copies / mL). A negative result must be combined with clinical observations, patient history, and epidemiological information. Fact Sheet  for Patients:   BoilerBrush.com.cy Fact Sheet for Healthcare Providers: https://pope.com/ This test is not yet approved or cleared  by the [place] FDA and has been authorized for detection and/or diagnosis of SARS-CoV-2 by FDA under an Emergency Use Authorization (EUA).  This EUA will remain in effect (meaning this test can be used) for the duration of the COVID-19 declaration under Section 564(b)(1) of the Act, 21 U.S.C. section 360bbb-3(b)(1), unless the authorization is terminated or revoked sooner. Performed at [facility], [address], [place], [place] [address]   Urine Culture     Status: Abnormal   Collection Time: 09/05/19 11:40 AM   Specimen: Urine, Random  Result Value Ref Range Status   Specimen Description URINE, RANDOM  Final   Special Requests   Final    NONE Performed at [facility], [address] [place], [place] [address]    Culture >=100,000 COLONIES/mL ESCHERICHIA COLI (A)  Final   Report Status 09/07/2019 FINAL  Final   Organism ID, Bacteria ESCHERICHIA COLI (A)  Final      Susceptibility   Escherichia coli - MIC*    AMPICILLIN 4 SENSITIVE Sensitive     CEFAZOLIN <=4 SENSITIVE Sensitive     CEFTRIAXONE <=1 SENSITIVE Sensitive     CIPROFLOXACIN <=0.25 SENSITIVE Sensitive     GENTAMICIN <=1 SENSITIVE Sensitive     IMIPENEM <=0.25 SENSITIVE Sensitive     NITROFURANTOIN <=16 SENSITIVE Sensitive     TRIMETH/SULFA <=20 SENSITIVE Sensitive     AMPICILLIN/SULBACTAM <=2 SENSITIVE Sensitive     PIP/TAZO <=4 SENSITIVE Sensitive     * >=100,000 COLONIES/mL ESCHERICHIA COLI         Radiology Studies: DG Chest Port 1 View  Result Date: 09/07/2019 CLINICAL DATA:  Acute respiratory distress EXAM: PORTABLE CHEST 1 VIEW COMPARISON:  CT 08/18/2019 FINDINGS: There is chronic elevation of the left hemidiaphragm likely with adjacent atelectatic changes. There are diffusely increasing hazy and  reticular opacities throughout both lungs. Suspect small effusions well. The pulmonary vascularity is indistinct. Cardiomediastinal contours are similar to prior with evidence of prior CABG including mediastinal surgical clips and sternotomy sutures. Atherosclerotic calcification of the thoracic aorta. No acute osseous or soft tissue abnormality. Degenerative changes are present in the imaged spine and shoulders. Telemetry leads overlie the chest. IMPRESSION: 1. Diffuse bilateral hazy and reticular opacities throughout  both lungs with some indistinctness of the pulmonary vascularity. Findings are suggestive of worsening CHF/volume overload with edema. Trace effusions are likely present as well. 2. Chronic elevation of the left hemidiaphragm likely with adjacent atelectatic changes. Electronically Signed   By: Lovena Le M.D.   On: 09/07/2019 01:31        Scheduled Meds: . aspirin  81 mg Oral Daily  . heparin injection (subcutaneous)  5,000 Units Subcutaneous Q8H  . insulin aspart  0-5 Units Subcutaneous QHS  . insulin aspart  0-6 Units Subcutaneous TID WC  . insulin aspart  2 Units Subcutaneous TID WC  . insulin glargine  13 Units Subcutaneous Q1400  . levothyroxine  50 mcg Oral QAC breakfast  . NIFEdipine  30 mg Oral Q12H  . senna-docusate  1 tablet Oral BID  . simvastatin  20 mg Oral q1800  . sodium chloride flush  3 mL Intravenous Q12H   Continuous Infusions: . sodium chloride    . cefTRIAXone (ROCEPHIN)  IV 1 g (09/07/19 0942)     LOS: 9 days    Time spent: 35 minutes  Domenic Polite, MD [facility]   09/07/2019, 1:12 PM

## 2019-09-07 NOTE — Progress Notes (Signed)
RT NOTES: Removed patient from bipap and placed on 4lpm nasal cannula. Tolerating well. Sats 96%. Will continue to monitor.

## 2019-09-07 NOTE — Progress Notes (Signed)
[facility] Progress Note   Subjective:  resp distress overnight, CXR with pulm edema - rec'd  Lasix IV and I/O cath with 519mL.  Total UOP yesterday 1.45L.  Cr improved to 1.8 this AM. Cardiology ordered 40 IV last and PM BMP for today.  Daughter and husband at bedside  Objective Vitals:   09/07/19 0600 09/07/19 0700 09/07/19 0734 09/07/19 0737  BP: 121/71 111/68 117/63 117/63  Pulse:  91  95  Resp: (!) 24 (!) 26  (!) 23  Temp: (!) 97.3 F (36.3 C)  97.6 F (36.4 C)   TempSrc: Oral  Oral   SpO2: 95% 99%  97%  Weight:      Height:       Physical Exam General: elderly woman, comfortable on Bipap Heart: IV/VI syst murmur LUSB Lungs: rales in bases still Abdomen: soft nontender Extremities: no LE edema Neuro: nonfocal, conversant, no asterixis.   Additional Objective Labs: Basic Metabolic Panel: Recent Labs  Lab 09/05/19 0645 09/06/19 0816 09/07/19 0550  NA 130* 135 136  K 3.6 2.8* 3.6  CL 98 100 102  CO2 18* 22 22  GLUCOSE 205* 226* 241*  BUN 64* 58* 43*  CREATININE 4.95* 2.88* 1.82*  CALCIUM 7.9* 8.1* 8.2*  PHOS  --  2.9  --    Liver Function Tests: Recent Labs  Lab 09/06/19 0816  ALBUMIN 2.9*   No results for input(s): LIPASE, AMYLASE in the last 168 hours. CBC: Recent Labs  Lab 09/07/2019 0558 08/24/2019 1657 09/02/19 0426 09/02/19 0426 09/03/19 0802 09/04/19 0728 09/06/19 0816  WBC 9.0  --  9.4   < > 9.1 8.1 6.0  HGB 11.4*   < > 10.7*   < > 9.8* 9.5* 10.4*  HCT 36.1   < > 33.1*   < > 31.4* 30.1* 32.3*  MCV 94.8  --  95.1  --  94.9 94.1 90.7  PLT 251  --  224   < > 191 195 260   < > = values in this interval not displayed.   Blood Culture    Component Value Date/Time   SDES URINE, RANDOM 09/05/2019 1140   SPECREQUEST  09/05/2019 1140    NONE Performed at [facility], [place] [address], [place], [place] [address]    CULT >=100,000 COLONIES/mL ESCHERICHIA COLI (A) 09/05/2019 1140   REPTSTATUS 09/07/2019 FINAL 09/05/2019  1140    Cardiac Enzymes: No results for input(s): CKTOTAL, CKMB, CKMBINDEX, TROPONINI in the last 168 hours. CBG: Recent Labs  Lab 09/06/19 0622 09/06/19 1117 09/06/19 1615 09/06/19 2108 09/07/19 0618  GLUCAP 261* 342* 276* 307* 246*   Iron Studies: No results for input(s): IRON, TIBC, TRANSFERRIN, FERRITIN in the last 72 hours. @lablastinr3 @ Studies/Results: DG Chest Port 1 View  Result Date: 09/07/2019 CLINICAL DATA:  Acute respiratory distress EXAM: PORTABLE CHEST 1 VIEW COMPARISON:  CT 26-Sep-2019 FINDINGS: There is chronic elevation of the left hemidiaphragm likely with adjacent atelectatic changes. There are diffusely increasing hazy and reticular opacities throughout both lungs. Suspect small effusions well. The pulmonary vascularity is indistinct. Cardiomediastinal contours are similar to prior with evidence of prior CABG including mediastinal surgical clips and sternotomy sutures. Atherosclerotic calcification of the thoracic aorta. No acute osseous or soft tissue abnormality. Degenerative changes are present in the imaged spine and shoulders. Telemetry leads overlie the chest. IMPRESSION: 1. Diffuse bilateral hazy and reticular opacities throughout both lungs with some indistinctness of the pulmonary vascularity. Findings are suggestive of worsening CHF/volume overload with  edema. Trace effusions are likely present as well. 2. Chronic elevation of the left hemidiaphragm likely with adjacent atelectatic changes. Electronically Signed   By: Kreg Shropshire M.D.   On: 09/07/2019 01:31   Medications: . sodium chloride    . cefTRIAXone (ROCEPHIN)  IV 1 g (09/07/19 0942)   . aspirin  81 mg Oral Daily  . heparin injection (subcutaneous)  5,000 Units Subcutaneous Q8H  . insulin aspart  0-5 Units Subcutaneous QHS  . insulin aspart  0-6 Units Subcutaneous TID WC  . insulin aspart  2 Units Subcutaneous TID WC  . insulin glargine  13 Units Subcutaneous Q1400  . levothyroxine  50 mcg Oral  QAC breakfast  . NIFEdipine  30 mg Oral Q12H  . senna-docusate  1 tablet Oral BID  . simvastatin  20 mg Oral q1800  . sodium bicarbonate  650 mg Oral TID  . sodium chloride flush  3 mL Intravenous Q12H    Assessment/ Plan: 1. AKI, severe - most likely contrast nephropathy (sp CT x 2) w/ ARB effect also possible . Creat 0.8 at baseline on admission, peaked at 4.95 and improved to 1.88 today. Expect to see continued improvement.   2. Severe AS - w/u in progress for TAVR, appears planned outpt pending recovery from inpatient issues.  3. HTN - losartan dc'd 5/22, metop 5/23. Procardia xl 30 bid for now w/ hold orders. Watch closely through day after last night's issues. 4.  CAD h/o CABG remote 5.  Chron diast CHF: stable at this time.  Per above watch BP closely today with last night's issues.  Resume antiHTN therapy if BPs up.  6. Metabolic acidosis: secondary to AKI; resolved, d/c po bicarb.  7. Hyponatremia, hypervolemic: resolved 8. Hypokalemia:  K normal this AM.  Estill Bakes MD 09/07/2019, 10:10 AM  [facility] Pager: [phone]

## 2019-09-08 ENCOUNTER — Inpatient Hospital Stay (HOSPITAL_COMMUNITY): Payer: Medicare Other

## 2019-09-08 DIAGNOSIS — J9601 Acute respiratory failure with hypoxia: Secondary | ICD-10-CM

## 2019-09-08 LAB — CBC
HCT: 30.8 % — ABNORMAL LOW (ref 36.0–46.0)
Hemoglobin: 9.8 g/dL — ABNORMAL LOW (ref 12.0–15.0)
MCH: 29.1 pg (ref 26.0–34.0)
MCHC: 31.8 g/dL (ref 30.0–36.0)
MCV: 91.4 fL (ref 80.0–100.0)
Platelets: 251 10*3/uL (ref 150–400)
RBC: 3.37 MIL/uL — ABNORMAL LOW (ref 3.87–5.11)
RDW: 15.3 % (ref 11.5–15.5)
WBC: 7.2 10*3/uL (ref 4.0–10.5)
nRBC: 0 % (ref 0.0–0.2)

## 2019-09-08 LAB — GLUCOSE, CAPILLARY
Glucose-Capillary: 91 mg/dL (ref 70–99)
Glucose-Capillary: 140 mg/dL — ABNORMAL HIGH (ref 70–99)
Glucose-Capillary: 201 mg/dL — ABNORMAL HIGH (ref 70–99)
Glucose-Capillary: 84 mg/dL (ref 70–99)
Glucose-Capillary: 107 mg/dL — ABNORMAL HIGH (ref 70–99)

## 2019-09-08 LAB — BLOOD GAS, ARTERIAL
Acid-Base Excess: 1.7 mmol/L (ref 0.0–2.0)
Bicarbonate: 25 mmol/L (ref 20.0–28.0)
Drawn by: 51185
FIO2: 40
O2 Saturation: 94.5 %
Patient temperature: 37
pCO2 arterial: 34.2 mmHg (ref 32.0–48.0)
pH, Arterial: 7.477 — ABNORMAL HIGH (ref 7.350–7.450)
pO2, Arterial: 71.8 mmHg — ABNORMAL LOW (ref 83.0–108.0)

## 2019-09-08 LAB — BASIC METABOLIC PANEL
Anion gap: 10 (ref 5–15)
BUN: 31 mg/dL — ABNORMAL HIGH (ref 8–23)
CO2: 26 mmol/L (ref 22–32)
Calcium: 8.5 mg/dL — ABNORMAL LOW (ref 8.9–10.3)
Chloride: 103 mmol/L (ref 98–111)
Creatinine, Ser: 1.33 mg/dL — ABNORMAL HIGH (ref 0.44–1.00)
GFR calc Af Amer: 42 mL/min — ABNORMAL LOW (ref >60)
GFR calc non Af Amer: 36 mL/min — ABNORMAL LOW (ref >60)
Glucose, Bld: 98 mg/dL (ref 70–99)
Potassium: 3.2 mmol/L — ABNORMAL LOW (ref 3.5–5.1)
Sodium: 139 mmol/L (ref 135–145)

## 2019-09-08 MED ORDER — FUROSEMIDE 10 MG/ML IJ SOLN
40.0000 mg | Freq: Two times a day (BID) | INTRAMUSCULAR | Status: AC
  Administered 2019-09-08 (×2): 40 mg via INTRAVENOUS
  Filled 2019-09-08 (×3): qty 4

## 2019-09-08 MED ORDER — CEPHALEXIN 250 MG PO CAPS
250.0000 mg | ORAL_CAPSULE | Freq: Three times a day (TID) | ORAL | Status: DC
  Administered 2019-09-09 – 2019-09-10 (×4): 250 mg via ORAL
  Filled 2019-09-08 (×4): qty 1

## 2019-09-08 MED ORDER — POTASSIUM CHLORIDE CRYS ER 20 MEQ PO TBCR
40.0000 meq | EXTENDED_RELEASE_TABLET | Freq: Two times a day (BID) | ORAL | Status: AC
  Administered 2019-09-08 (×2): 40 meq via ORAL
  Filled 2019-09-08 (×2): qty 2

## 2019-09-08 NOTE — Progress Notes (Signed)
[facility] Progress Note   Subjective:  Remains on Bipap this AM, persistent edema on CXR.  I/Os yesterday neg 1.4L, neg 2.1 for the admission; wts show admit 54.4 today 54.7kg.    Objective Vitals:   09/08/19 0800 09/08/19 1108 09/08/19 1132 09/08/19 1133  BP:  109/64  109/64  Pulse:  96 80 100  Resp:  (!) 22  (!) 24  Temp: 99.3 F (37.4 C)  98.5 F (36.9 C) 98.5 F (36.9 C)  TempSrc: Oral  Oral Axillary  SpO2:  97% 96% 99%  Weight:      Height:       Physical Exam General: elderly woman, looks tired on Bipap Heart: IV/VI syst murmur LUSB Lungs: clear ant Abdomen: soft nontender Extremities: no LE edema Neuro: interactive  Additional Objective Labs: Basic Metabolic Panel: Recent Labs  Lab 09/06/19 0816 09/06/19 0816 09/07/19 0550 09/07/19 1634 09/08/19 0446  NA 135   < > 136 136 139  K 2.8*   < > 3.6 3.4* 3.2*  CL 100   < > 102 102 103  CO2 22   < > 22 22 26   GLUCOSE 226*   < > 241* 237* 98  BUN 58*   < > 43* 35* 31*  CREATININE 2.88*   < > 1.82* 1.50* 1.33*  CALCIUM 8.1*   < > 8.2* 7.9* 8.5*  PHOS 2.9  --   --   --   --    < > = values in this interval not displayed.   Liver Function Tests: Recent Labs  Lab 09/06/19 0816  ALBUMIN 2.9*   No results for input(s): LIPASE, AMYLASE in the last 168 hours. CBC: Recent Labs  Lab 09/02/19 0426 09/02/19 0426 09/03/19 0802 09/03/19 0802 09/04/19 0728 09/06/19 0816 09/08/19 0446  WBC 9.4   < > 9.1   < > 8.1 6.0 7.2  HGB 10.7*   < > 9.8*   < > 9.5* 10.4* 9.8*  HCT 33.1*   < > 31.4*   < > 30.1* 32.3* 30.8*  MCV 95.1  --  94.9  --  94.1 90.7 91.4  PLT 224   < > 191   < > 195 260 251   < > = values in this interval not displayed.   Blood Culture    Component Value Date/Time   SDES URINE, RANDOM 09/05/2019 1140   SPECREQUEST  09/05/2019 1140    NONE Performed at [facility], [place] [address] [place], [place] [address]    CULT >=100,000 COLONIES/mL ESCHERICHIA COLI (A)  09/05/2019 1140   REPTSTATUS 09/07/2019 FINAL 09/05/2019 1140    Cardiac Enzymes: No results for input(s): CKTOTAL, CKMB, CKMBINDEX, TROPONINI in the last 168 hours. CBG: Recent Labs  Lab 09/07/19 1129 09/07/19 1712 09/07/19 2124 09/08/19 0617 09/08/19 1136  GLUCAP 192* 248* 243* 91 140*   Iron Studies: No results for input(s): IRON, TIBC, TRANSFERRIN, FERRITIN in the last 72 hours. @lablastinr3 @ Studies/Results: DG CHEST PORT 1 VIEW  Result Date: 09/08/2019 CLINICAL DATA:  Shortness of breath. EXAM: PORTABLE CHEST 1 VIEW COMPARISON:  09/07/2019 FINDINGS: The heart is mildly enlarged. The mediastinal and hilar contours are within normal limits and stable. There is moderate central vascular congestion and mild interstitial edema. Stable moderate eventration of the left hemidiaphragm with overlying vascular crowding and atelectasis. No pneumothorax. IMPRESSION: Persistent changes of CHF. Electronically Signed   By: Marijo Sanes M.D.   On: 09/08/2019 08:10   DG Chest [facility]  1 View  Result Date: 09/07/2019 CLINICAL DATA:  Acute respiratory distress EXAM: PORTABLE CHEST 1 VIEW COMPARISON:  CT 09/12/2019 FINDINGS: There is chronic elevation of the left hemidiaphragm likely with adjacent atelectatic changes. There are diffusely increasing hazy and reticular opacities throughout both lungs. Suspect small effusions well. The pulmonary vascularity is indistinct. Cardiomediastinal contours are similar to prior with evidence of prior CABG including mediastinal surgical clips and sternotomy sutures. Atherosclerotic calcification of the thoracic aorta. No acute osseous or soft tissue abnormality. Degenerative changes are present in the imaged spine and shoulders. Telemetry leads overlie the chest. IMPRESSION: 1. Diffuse bilateral hazy and reticular opacities throughout both lungs with some indistinctness of the pulmonary vascularity. Findings are suggestive of worsening CHF/volume overload with edema.  Trace effusions are likely present as well. 2. Chronic elevation of the left hemidiaphragm likely with adjacent atelectatic changes. Electronically Signed   By: Kreg Shropshire M.D.   On: 09/07/2019 01:31   Medications: . sodium chloride 250 mL (09/08/19 0852)  . cefTRIAXone (ROCEPHIN)  IV 1 g (09/08/19 0854)   . aspirin  81 mg Oral Daily  . furosemide  40 mg Intravenous BID  . heparin injection (subcutaneous)  5,000 Units Subcutaneous Q8H  . insulin aspart  0-5 Units Subcutaneous QHS  . insulin aspart  0-6 Units Subcutaneous TID WC  . insulin aspart  4 Units Subcutaneous TID WC  . insulin glargine  15 Units Subcutaneous Q1400  . levothyroxine  50 mcg Oral QAC breakfast  . potassium chloride  40 mEq Oral BID  . senna-docusate  1 tablet Oral BID  . simvastatin  20 mg Oral q1800  . sodium chloride flush  3 mL Intravenous Q12H    Assessment/ Plan: 1. AKI, severe - course consistent with contrast nephropathy (sp CT x 2) w/ ARB effect also possible . Creat 0.8 at baseline on admission, peaked at 4.95 and improved to 1.3 today. Expect to see continued improvement.    See below for discussion re: upcoming TAVR.  2. Severe AS - given clinical status plan is for her to remain inpatient awaiting TAVR which will be planned for next Tuesday.  I spoke with cardiology --> 9mL contrast used for TAVR.  I think that although there is a risk for contrast nephropathy the contrast volume is low and given marked improvement in renal function it's an acceptable risk to proceed in light of severe AS.  The only renoprotective maneuver to prevent CIN is volume expansion but in light of her extremely tenuous status I wouldn't do more than very gentle hydration prior to TAVR --> e.g. NS 50-64mL/hr x 4-6h IF she is euvolemic going into it.   3. HTN - losartan dc'd 5/22, metop 5/23. Procardia xl 30 bid for now w/ hold orders.  Cardiology managing.  4.  CAD h/o CABG remote 5.  Chron diast CHF: diuresing today in light of  continued pulmonary edema on CXR and bipap, ok with lasix 40 IV.  6. Metabolic acidosis: secondary to AKI; resolved, d/c'd po bicarb.  7. Hyponatremia, hypervolemic: resolved 8. Hypokalemia:  K normal this AM.  I'll sign off given renal function nearly back to normal.  Please call us back with questions or concerns.  Discussed with she and husband at bedside today.   Estill Bakes MD 09/08/2019, 2:25 PM  [facility] Pager: [phone]

## 2019-09-08 NOTE — Progress Notes (Signed)
Pt wasn't able to come off bipap. We took her off bipap for a few minutes, she increased work of breathing and became unresponsive. After we put bipap on, she started to open her eyes and became responsive. MD paged.

## 2019-09-08 NOTE — Progress Notes (Addendum)
PROGRESS NOTE    Nancy Mcguire  QMV:[id number] DOB: 27-Mar-1930 DOA: 2019/09/19 PCP: Angelica Chessman, MD   Brief Narrative: 86 year old with past medical history significant for diastolic heart failure not on diuretics, moderate to severe pulmonary hypertension, severe aortic stenosis, heart murmur, CAD, carotid artery stenosis, CKD stage III AAA, diabetes type 2 on insulin pump, hypothyroidism, chronic back pain who presented with worsening shortness of breath and dry cough. Patient was admitted with acute on chronic diastolic heart failure exacerbation, chest x-ray showed cardiomegaly and small bilateral pleural effusion, CTA negative for PE, BNP 1024.  Patient acute heart failure exacerbation was treated with IV Laxis, subsequently she was transitioned to oral Lasix.  She underwent evaluation for aortic valve replacement (TAVAR), cardiac cath and CT chest with contrast.  Hospital course complicated by acute renal failure secondary to contrast nephropathy.  Patient received IV fluid transiently.  Creatinine continued to increase, nephrology consulted.    Assessment & Plan:   Acute on chronic diastolic heart failure  Acute on chronic hypoxic respiratory failure severe pulmonary hypertension Severe aortic stenosis Echo with ejection fraction 60 to 65%, severe aortic valve stenosis -Diuresed with IV Lasix and then diuretics were held in the setting of AKI -Recurrent flash pulmonary edema requiring BiPAP 2 nights ago and again overnight -Cardiology following, plan to keep inpatient until TAVR early next week -Diuresed with IV Lasix today -Monitor urine output and kidney function -Need to avoid hypotension, stop nifedipine with soft blood pressures  Aortic Valve Severe Stenosis:  CAD/CABG  cardiology consulting Cardiology has consulted TAVR team for further evaluation. Had Cath: LIMA and Circumflex graft open, diagonal graft with obstruction.  Underwent  CT scans, CVTS sx consulted ,  TAVR preproned to early next week  Acute on CKD stage III a; Contrast nephropathy -Creatinine peaked at 4.9 -Now improving -Was briefly hydrated with IVs fluids as well, renal ultrasound without hydronephrosis -Appreciate nephrology input -Kidney function improving, diuresed with IV Lasix  E. coli UTI -Day 3 of IV ceftriaxone, changed to oral Keflex today for 5 days total  Controlled diabetes type 1 with hyperglycemia: -CBGs more stable, continue Lantus and meal coverage insulin, resume insulin pump at discharge  Essential hypertension: Continue with losartan and metoprolol.  Hypothyroidism continue with Synthroid.  Hypomagnesemia: Replete.  Constipation: started  senna, lactulose.  Had a bowel movement  Estimated body mass index is 22.06 kg/m as calculated from the following:   Height as of this encounter: 5\' 2"  (1.575 m).   Weight as of this encounter: 54.7 kg.   DVT prophylaxis: Lovenox Code Status: DNR DNI Family Communication: Daughter at bedside yesterday Disposition Plan:  Status is: Inpatient Remains fluid overloaded, developed flash pulmonary edema last night, improving acute kidney injury  Dispo: The patient is from: Home              Anticipated d/c is to: Home               Anticipated d/c date is: 1 week due to recurrent pulmonary edema requiring BiPAP, plan for TAVR early next week              Patient currently; not medical stable for discharge        Consultants:   Cardiology  Procedures:   Echo  Antimicrobials:  None  Subjective: -Developed respiratory distress again overnight,  back on BiPAP multiple times last night  Objective: Vitals:   09/08/19 0800 09/08/19 1108 09/08/19 1132 09/08/19 1133  BP:  109/64  109/64  Pulse:  96 80 100  Resp:  (!) 22  (!) 24  Temp: 99.3 F (37.4 C)  98.5 F (36.9 C) 98.5 F (36.9 C)  TempSrc: Oral  Oral Axillary  SpO2:  97% 96% 99%  Weight:      Height:        Intake/Output Summary (Last 24  hours) at 09/08/2019 1441 Last data filed at 09/08/2019 1358 Gross per 24 hour  Intake 450 ml  Output 1920 ml  Net -1470 ml   Filed Weights   09/06/19 0300 09/07/19 0302 09/08/19 0345  Weight: 53.3 kg 54 kg 54.7 kg    Examination:  General exam: Elderly frail chronically ill female sitting up in bed, awake alert, BiPAP on HEENT positive JVD CVS S1-S2 regular rate rhythm, loud systolic ejection murmur Lungs with fine bilateral crackles Abdomen is soft nontender with positive bowel sounds Extremities with no edema Neuro moves all extremities, no localizing signs   Data Reviewed: I have personally reviewed following labs and imaging studies  CBC: Recent Labs  Lab 09/02/19 0426 09/03/19 0802 09/04/19 0728 09/06/19 0816 09/08/19 0446  WBC 9.4 9.1 8.1 6.0 7.2  HGB 10.7* 9.8* 9.5* 10.4* 9.8*  HCT 33.1* 31.4* 30.1* 32.3* 30.8*  MCV 95.1 94.9 94.1 90.7 91.4  PLT 224 191 195 260 378   Basic Metabolic Panel: Recent Labs  Lab 09/05/19 0645 09/06/19 0816 09/07/19 0550 09/07/19 1634 09/08/19 0446  NA 130* 135 136 136 139  K 3.6 2.8* 3.6 3.4* 3.2*  CL 98 100 102 102 103  CO2 18* 22 22 22 26   GLUCOSE 205* 226* 241* 237* 98  BUN 64* 58* 43* 35* 31*  CREATININE 4.95* 2.88* 1.82* 1.50* 1.33*  CALCIUM 7.9* 8.1* 8.2* 7.9* 8.5*  PHOS  --  2.9  --   --   --    GFR: Estimated Creatinine Clearance: 23.6 mL/min (A) (by C-G formula based on SCr of 1.33 mg/dL (H)). Liver Function Tests: Recent Labs  Lab 09/06/19 0816  ALBUMIN 2.9*   No results for input(s): LIPASE, AMYLASE in the last 168 hours. No results for input(s): AMMONIA in the last 168 hours. Coagulation Profile: No results for input(s): INR, PROTIME in the last 168 hours. Cardiac Enzymes: No results for input(s): CKTOTAL, CKMB, CKMBINDEX, TROPONINI in the last 168 hours. BNP (last 3 results) No results for input(s): PROBNP in the last 8760 hours. HbA1C: Recent Labs    09/06/19 0816  HGBA1C 7.5*   CBG: Recent  Labs  Lab 09/07/19 1129 09/07/19 1712 09/07/19 2124 09/08/19 0617 09/08/19 1136  GLUCAP 192* 248* 243* 91 140*   Lipid Profile: No results for input(s): CHOL, HDL, LDLCALC, TRIG, CHOLHDL, LDLDIRECT in the last 72 hours. Thyroid Function Tests: No results for input(s): TSH, T4TOTAL, FREET4, T3FREE, THYROIDAB in the last 72 hours. Anemia Panel: No results for input(s): VITAMINB12, FOLATE, FERRITIN, TIBC, IRON, RETICCTPCT in the last 72 hours. Sepsis Labs: No results for input(s): PROCALCITON, LATICACIDVEN in the last 168 hours.  Recent Results (from the past 240 hour(s))  SARS Coronavirus 2 by RT PCR (hospital order, performed in [facility] hospital lab) Nasopharyngeal Nasopharyngeal Swab     Status: None   Collection Time: September 15, 2019  7:56 PM   Specimen: Nasopharyngeal Swab  Result Value Ref Range Status   SARS Coronavirus 2 NEGATIVE NEGATIVE Final    Comment: (NOTE) SARS-CoV-2 target nucleic acids are NOT DETECTED. The SARS-CoV-2 RNA is generally detectable in upper and lower respiratory specimens during  the acute phase of infection. The lowest concentration of SARS-CoV-2 viral copies this assay can detect is 250 copies / mL. A negative result does not preclude SARS-CoV-2 infection and should not be used as the sole basis for treatment or other patient management decisions.  A negative result may occur with improper specimen collection / handling, submission of specimen other than nasopharyngeal swab, presence of viral mutation(s) within the areas targeted by this assay, and inadequate number of viral copies (<250 copies / mL). A negative result must be combined with clinical observations, patient history, and epidemiological information. Fact Sheet for Patients:   BoilerBrush.com.cy Fact Sheet for Healthcare Providers: https://pope.com/ This test is not yet approved or cleared  by the [place] FDA and has been authorized  for detection and/or diagnosis of SARS-CoV-2 by FDA under an Emergency Use Authorization (EUA).  This EUA will remain in effect (meaning this test can be used) for the duration of the COVID-19 declaration under Section 564(b)(1) of the Act, 21 U.S.C. section 360bbb-3(b)(1), unless the authorization is terminated or revoked sooner. Performed at [facility], [address] [place], [place] [address]   Urine Culture     Status: Abnormal   Collection Time: 09/05/19 11:40 AM   Specimen: Urine, Random  Result Value Ref Range Status   Specimen Description URINE, RANDOM  Final   Special Requests   Final    NONE Performed at [facility], [address] [place], [place] [address]    Culture >=100,000 COLONIES/mL ESCHERICHIA COLI (A)  Final   Report Status 09/07/2019 FINAL  Final   Organism ID, Bacteria ESCHERICHIA COLI (A)  Final      Susceptibility   Escherichia coli - MIC*    AMPICILLIN 4 SENSITIVE Sensitive     CEFAZOLIN <=4 SENSITIVE Sensitive     CEFTRIAXONE <=1 SENSITIVE Sensitive     CIPROFLOXACIN <=0.25 SENSITIVE Sensitive     GENTAMICIN <=1 SENSITIVE Sensitive     IMIPENEM <=0.25 SENSITIVE Sensitive     NITROFURANTOIN <=16 SENSITIVE Sensitive     TRIMETH/SULFA <=20 SENSITIVE Sensitive     AMPICILLIN/SULBACTAM <=2 SENSITIVE Sensitive     PIP/TAZO <=4 SENSITIVE Sensitive     * >=100,000 COLONIES/mL ESCHERICHIA COLI         Radiology Studies: DG CHEST PORT 1 VIEW  Result Date: 09/08/2019 CLINICAL DATA:  Shortness of breath. EXAM: PORTABLE CHEST 1 VIEW COMPARISON:  09/07/2019 FINDINGS: The heart is mildly enlarged. The mediastinal and hilar contours are within normal limits and stable. There is moderate central vascular congestion and mild interstitial edema. Stable moderate eventration of the left hemidiaphragm with overlying vascular crowding and atelectasis. No pneumothorax. IMPRESSION: Persistent changes of CHF. Electronically Signed   By: Rudie Meyer M.D.    On: 09/08/2019 08:10   DG Chest Port 1 View  Result Date: 09/07/2019 CLINICAL DATA:  Acute respiratory distress EXAM: PORTABLE CHEST 1 VIEW COMPARISON:  CT 09/19/19 FINDINGS: There is chronic elevation of the left hemidiaphragm likely with adjacent atelectatic changes. There are diffusely increasing hazy and reticular opacities throughout both lungs. Suspect small effusions well. The pulmonary vascularity is indistinct. Cardiomediastinal contours are similar to prior with evidence of prior CABG including mediastinal surgical clips and sternotomy sutures. Atherosclerotic calcification of the thoracic aorta. No acute osseous or soft tissue abnormality. Degenerative changes are present in the imaged spine and shoulders. Telemetry leads overlie the chest. IMPRESSION: 1. Diffuse bilateral hazy and reticular opacities throughout both lungs with some indistinctness  of the pulmonary vascularity. Findings are suggestive of worsening CHF/volume overload with edema. Trace effusions are likely present as well. 2. Chronic elevation of the left hemidiaphragm likely with adjacent atelectatic changes. Electronically Signed   By: Kreg Shropshire M.D.   On: 09/07/2019 01:31        Scheduled Meds: . aspirin  81 mg Oral Daily  . furosemide  40 mg Intravenous BID  . heparin injection (subcutaneous)  5,000 Units Subcutaneous Q8H  . insulin aspart  0-5 Units Subcutaneous QHS  . insulin aspart  0-6 Units Subcutaneous TID WC  . insulin aspart  4 Units Subcutaneous TID WC  . insulin glargine  15 Units Subcutaneous Q1400  . levothyroxine  50 mcg Oral QAC breakfast  . potassium chloride  40 mEq Oral BID  . senna-docusate  1 tablet Oral BID  . simvastatin  20 mg Oral q1800  . sodium chloride flush  3 mL Intravenous Q12H   Continuous Infusions: . sodium chloride 250 mL (09/08/19 0852)  . cefTRIAXone (ROCEPHIN)  IV 1 g (09/08/19 0854)     LOS: 10 days    Time spent: 25 minutes  Zannie Cove, MD [facility]   09/08/2019, 2:41 PM

## 2019-09-08 NOTE — Progress Notes (Signed)
PT Cancellation Note  Patient Details Name: Nancy Mcguire MRN: [id number] DOB: 1929-04-21   Cancelled Treatment:    Reason Eval/Treat Not Completed: (P) Medical issues which prohibited therapy Pt currently on BiPAP unable to wean. PT will follow back this afternoon to see if pt is appropriate.   Preethi Scantlebury B. Beverely Risen PT, DPT Acute Rehabilitation Services Pager [phone] Office [phone]    Elon Alas [facility] 09/08/2019, 8:41 AM

## 2019-09-08 NOTE — Progress Notes (Signed)
  [facility]   MULTIDISCIPLINARY HEART VALVE TEAM  Patient still on Bipap with recurrent flash pulmonary edema. Creat continues to improve ~1.3 today. Discussed case with multidisciplinary valve team, Dr. Cristal Deer and Dr. Glenna Fellows with nephrology who all agree we need to keep her inpatient and plan to do TAVR (first case) next Tuesday with Drs Clifton James and Laneta Simmers. Discussed with patient and family who are in agreement. Her work up is complete. I will order pre operative labs next Monday.    Cline Crock PA-C  [facility]

## 2019-09-08 NOTE — Progress Notes (Signed)
Pts order is BIPAP PRN. Pt placed on BIPAP with the following settings 12/6 40% BUR 8. Pt respiratory status stable at this time. No distress noted. RT will continue to monitor.

## 2019-09-08 NOTE — Progress Notes (Signed)
Inpatient Diabetes Program Recommendations  AACE/ADA: New Consensus Statement on Inpatient Glycemic Control (2015)  Target Ranges:  Prepandial:   less than 140 mg/dL      Peak postprandial:   less than 180 mg/dL (1-2 hours)      Critically ill patients:  140 - 180 mg/dL   Lab Results  Component Value Date   GLUCAP 91 09/08/2019   HGBA1C 7.5 (H) 09/06/2019    Review of Glycemic Control Results for Nancy Mcguire, Nancy Mcguire (MRN [id number]) as of 09/08/2019 08:28  Ref. Range 09/07/2019 06:18 09/07/2019 11:29 09/07/2019 17:12 09/07/2019 21:24 09/08/2019 06:17  Glucose-Capillary Latest Ref Range: 70 - 99 mg/dL 897 (H) 915 (H) 041 (H) 243 (H) 91   Diabetes history: DM1(does not make insulin.  Needs correction, basal and meal coverage)  Outpatient Diabetes medications:  Insulin Pump  Current orders for Inpatient glycemic control:  Novolog 0-6 tis  Novolog 0-5 qhs Novolog 4 units tid with meals Lantus 15 units daily    Inpatient Diabetes Program Recommendations:     Increase correction scale: Novolog 0-9 units tid  Thank you, Dulce Sellar, RN, BSN Diabetes Coordinator Inpatient Diabetes Program [phone] (team pager from 8a-5p)

## 2019-09-08 NOTE — Progress Notes (Signed)
Physical Therapy Treatment Patient Details Name: Nancy Mcguire MRN: [id number] DOB: [birth year] Today's Date: 09/08/2019    History of Present Illness Pt is an 86 y/o female admitted secondary to CHF exacerbation. Was also found to have severe aortic stenosis and is being assessed for TAVR. Pt for heart cath on 09/05/2019. PMH includes HTN, CAD s/p CABG, CHF, and DM.  Rapid Response called for difficulty breathing, CXR revealed pulmonary edema and pt placed on BiPAP 5/26    PT Comments    Pt sitting up in bed on entry, on 4L O2 via [place] SaO2 96%O2. RN and pt agreeable to bed exercises. Pt able to perform limited exercise with SaO2 drop to 87%O2. Exercise stopped and pt cued in pursed lipped breathing and able to maintain SaO2 at 90-93%O2. Pt with complaints of L flank pain and neck pain. Supported neck with rolled towel and provided soft tissue mobilization to trigger point on L flank. Pt with fatigue performing pursed lipped breathing. Pt requested Tylenol and RN notified. D/c plan remains appropriate when medically stable. PT will continue to follow acutely.   Follow Up Recommendations  Home health PT;Supervision for mobility/OOB     Equipment Recommendations  None recommended by PT       Precautions / Restrictions Precautions Precautions: Fall Restrictions Weight Bearing Restrictions: No          Cognition Arousal/Alertness: Awake/alert Behavior During Therapy: WFL for tasks assessed/performed Overall Cognitive Status: Within Functional Limits for tasks assessed                                        Exercises General Exercises - Lower Extremity Ankle Circles/Pumps: AROM;Both;10 reps;Other (comment)(HoB 60%) Straight Leg Raises: Both;10 reps;AAROM(HoB 60%)    General Comments General comments (skin integrity, edema, etc.): Pt husband is present during session and very attentive to his wife. Pt weaning off BiPAP and is on 4L O2 via [place], able to maintain SaO2 95-98%  on entry as session progressed pt with increased discomfort SaO2 dropped to 87%O2, pt able to maintain SaO2 93%O2 with cues for puresed lipped breathing however pt with increased fatigue RN notified.       Pertinent Vitals/Pain Pain Assessment: 0-10 Pain Score: 8  Pain Location: L flank Pain Descriptors / Indicators: ("catch") Pain Intervention(s): Utilized relaxation techniques;Other (comment);Repositioned;Monitored during session;Limited activity within patient's tolerance;RN gave pain meds during session(soft tissue mobilization )           PT Goals (current goals can now be found in the care plan section) Acute Rehab PT Goals Patient Stated Goal: to go home PT Goal Formulation: With patient Time For Goal Achievement: 09/15/19 Potential to Achieve Goals: Good    Frequency    Min 3X/week      PT Plan Current plan remains appropriate       AM-PAC PT "6 Clicks" Mobility   Outcome Measure  Help needed turning from your back to your side while in a flat bed without using bedrails?: None Help needed moving from lying on your back to sitting on the side of a flat bed without using bedrails?: None Help needed moving to and from a bed to a chair (including a wheelchair)?: A Little Help needed standing up from a chair using your arms (e.g., wheelchair or bedside chair)?: A Little Help needed to walk in hospital room?: A Little Help needed climbing 3-5 steps with a railing? :  A Lot 6 Click Score: 19    End of Session Equipment Utilized During Treatment: Gait belt Activity Tolerance: Patient tolerated treatment well Patient left: in bed;with call bell/phone within reach;with nursing/sitter in room;with family/visitor present Nurse Communication: Mobility status PT Visit Diagnosis: Muscle weakness (generalized) (M62.81)     Time: 5093-2671 PT Time Calculation (min) (ACUTE ONLY): 30 min  Charges:  $Therapeutic Exercise: 23-37 mins                     Emersen Carroll B. Migdalia Dk PT, DPT Acute Rehabilitation Services Pager [phone] Office [phone]    [place] 09/08/2019, 4:25 PM

## 2019-09-08 NOTE — Progress Notes (Signed)
Progress Note  Patient Name: Nancy Mcguire Date of Encounter: 09/08/2019  Primary Cardiologist: Nancy Batty, MD   Subjective   Weaned to nasal cannula yesterday AM around 11 AM. Became short of breath around 1900 yesterday, placed back on bipap. Trialed off bipap several times overnight, but sats drop and she has increased work of breathing. CXR today with continued pulmonary edema, elevated left hemidiaphragm (chronic).   She remains on bipap this AM. Husband at bedside. They have already spoken with TAVR team and understand plans to do TAVR while inpatient next week given her tenuous clinical status.  Inpatient Medications    Scheduled Meds: . aspirin  81 mg Oral Daily  . furosemide  40 mg Intravenous BID  . heparin injection (subcutaneous)  5,000 Units Subcutaneous Q8H  . insulin aspart  0-5 Units Subcutaneous QHS  . insulin aspart  0-6 Units Subcutaneous TID WC  . insulin aspart  4 Units Subcutaneous TID WC  . insulin glargine  15 Units Subcutaneous Q1400  . levothyroxine  50 mcg Oral QAC breakfast  . potassium chloride  40 mEq Oral BID  . senna-docusate  1 tablet Oral BID  . simvastatin  20 mg Oral q1800  . sodium chloride flush  3 mL Intravenous Q12H   Continuous Infusions: . sodium chloride 250 mL (09/08/19 0852)  . cefTRIAXone (ROCEPHIN)  IV 1 g (09/08/19 0854)   PRN Meds: sodium chloride, acetaminophen, lactulose, ondansetron (ZOFRAN) IV, sodium chloride flush   Vital Signs    Vitals:   09/08/19 0752 09/08/19 0753 09/08/19 0800 09/08/19 0800  BP: 112/63  109/60   Pulse: 100 100    Resp: (!) 21 (!) 24    Temp:    99.3 F (37.4 C)  TempSrc:    Oral  SpO2: 100% 99%    Weight:      Height:        Intake/Output Summary (Last 24 hours) at 09/08/2019 1059 Last data filed at 09/08/2019 0825 Gross per 24 hour  Intake 480 ml  Output 1700 ml  Net -1220 ml   Last 3 Weights 09/08/2019 09/07/2019 09/06/2019  Weight (lbs) 120 lb 9.5 oz 119 lb 0.8 oz 117 lb 6.4 oz   Weight (kg) 54.7 kg 54 kg 53.252 kg      Telemetry    NSR, with occasional sinus tachycardia- Personally Reviewed  ECG    09/07/19 computer read as afib RVR. Based on ECG and telemetry, appears more consistent with sinus tachycardia with PACs - Personally Reviewed  Physical Exam   GEN: Frail appearing, bipap in place HEENT: Normal, moist mucous membranes NECK: No JVD visible at 90 degrees CARDIAC: regular rhythm, normal S1 and S2, no rubs or gallops. 4/6 SE murmur. VASCULAR: Radial and DP pulses 2+ bilaterally.  RESPIRATORY:  Bipap air flow. Clear in upper fields, but crackles in mid to lower lung fields ABDOMEN: Soft, non-tender, non-distended MUSCULOSKELETAL:  Moves all 4 limbs independently SKIN: Warm and dry, no edema NEUROLOGIC:  Alert and oriented x 3. No focal neuro deficits noted. PSYCHIATRIC:  Normal affect   Labs    High Sensitivity Troponin:   Recent Labs  Lab 2019-09-20 1220 09-20-19 1539  TROPONINIHS 21* 26*      Chemistry Recent Labs  Lab 09/06/19 0816 09/06/19 0816 09/07/19 0550 09/07/19 1634 09/08/19 0446  NA 135   < > 136 136 139  K 2.8*   < > 3.6 3.4* 3.2*  CL 100   < > 102 102 103  CO2 22   < > 22 22 26   GLUCOSE 226*   < > 241* 237* 98  BUN 58*   < > 43* 35* 31*  CREATININE 2.88*   < > 1.82* 1.50* 1.33*  CALCIUM 8.1*   < > 8.2* 7.9* 8.5*  ALBUMIN 2.9*  --   --   --   --   GFRNONAA 14*   < > 25* 31* 36*  GFRAA 16*   < > 28* 36* 42*  ANIONGAP 13   < > 12 12 10    < > = values in this interval not displayed.     Hematology Recent Labs  Lab 09/04/19 0728 09/06/19 0816 09/08/19 0446  WBC 8.1 6.0 7.2  RBC 3.20* 3.56* 3.37*  HGB 9.5* 10.4* 9.8*  HCT 30.1* 32.3* 30.8*  MCV 94.1 90.7 91.4  MCH 29.7 29.2 29.1  MCHC 31.6 32.2 31.8  RDW 15.6* 14.9 15.3  PLT 195 260 251    BNP No results for input(s): BNP, PROBNP in the last 168 hours.   DDimer No results for input(s): DDIMER in the last 168 hours.   Radiology    DG CHEST PORT 1  VIEW  Result Date: 09/08/2019 CLINICAL DATA:  Shortness of breath. EXAM: PORTABLE CHEST 1 VIEW COMPARISON:  09/07/2019 FINDINGS: The heart is mildly enlarged. The mediastinal and hilar contours are within normal limits and stable. There is moderate central vascular congestion and mild interstitial edema. Stable moderate eventration of the left hemidiaphragm with overlying vascular crowding and atelectasis. No pneumothorax. IMPRESSION: Persistent changes of CHF. Electronically Signed   By: 09/10/2019 M.D.   On: 09/08/2019 08:10   DG Chest Port 1 View  Result Date: 09/07/2019 CLINICAL DATA:  Acute respiratory distress EXAM: PORTABLE CHEST 1 VIEW COMPARISON:  CT 08/14/2019 FINDINGS: There is chronic elevation of the left hemidiaphragm likely with adjacent atelectatic changes. There are diffusely increasing hazy and reticular opacities throughout both lungs. Suspect small effusions well. The pulmonary vascularity is indistinct. Cardiomediastinal contours are similar to prior with evidence of prior CABG including mediastinal surgical clips and sternotomy sutures. Atherosclerotic calcification of the thoracic aorta. No acute osseous or soft tissue abnormality. Degenerative changes are present in the imaged spine and shoulders. Telemetry leads overlie the chest. IMPRESSION: 1. Diffuse bilateral hazy and reticular opacities throughout both lungs with some indistinctness of the pulmonary vascularity. Findings are suggestive of worsening CHF/volume overload with edema. Trace effusions are likely present as well. 2. Chronic elevation of the left hemidiaphragm likely with adjacent atelectatic changes. Electronically Signed   By: 09/09/2019 M.D.   On: 09/07/2019 01:31    Cardiac Studies   Cath 08/27/2019  Prox LAD to Mid LAD lesion is 90% stenosed.  Mid LAD-1 lesion is 90% stenosed.  Mid LAD-2 lesion is 90% stenosed.  Origin to Prox Graft lesion is 100% stenosed.  Ost Cx to Prox Cx lesion is 90%  stenosed.  2nd Mrg lesion is 90% stenosed.  Mid Graft to Dist Graft lesion is 60% stenosed.  Prox Graft to Mid Graft lesion is 40% stenosed.  Hemodynamic findings consistent with aortic valve stenosis.  IMPRESSION: Nancy Mcguire has 2 of 3 grafts patent in the left dominant system.  The vein graft to the diagonal branch is occluded at the origin.  The LIMA to the LAD is patent with significant proximal LAD disease proximal to LIMA insertion.  The vein graft to the circumflex has moderate disease in the mid and distal shaft.  The RCA is nondominant.  She has severe aortic stenosis with a valve area between 0.6 1.8 by Fick or thermal dilution and EDP of 18.  I did shoot her distal abdominal aorta revealing widely patent iliacs arteries.  I reviewed the films with Dr. Clifton James who feels that she is a acceptable candidate for TAVR.  I performed angiography of the right common femoral artery and and successfully deployed a MYNX closure device in the right common femoral artery and vein achieving hemostasis.  The patient left lab in stable condition.  Hemodynamic findings consistent with aortic valve stenosis. Right atrial pressure-5/7 Right ventricular pressure 56 systolic Pulmonary artery pressure-54/17, mean 31 Pulmonary wedge pressure-A-wave 14, V wave 15, mean 12 LVEDP-18 Strict cardiac output-4.9 L/min with an index of 3.3 L/min/m by Fick, 5.6 L/min with an index of 4.4 L/min/m by thermodilution Aortic valve area-0.81 cm by thermodilution, 0.61 cm by Fick  Echo 08/30/19 1. Left ventricular ejection fraction, by estimation, is 60 to 65%. The  left ventricle has normal function. The left ventricle has no regional  wall motion abnormalities. There is mild left ventricular hypertrophy.  Left ventricular diastolic parameters  are consistent with Grade II diastolic dysfunction (pseudonormalization).  Elevated left ventricular end-diastolic pressure.  2. Right ventricular systolic function is  mildly reduced. The right  ventricular size is normal. There is severely elevated pulmonary artery  systolic pressure. The estimated right ventricular systolic pressure is  82.2 mmHg.  3. Left atrial size was mildly dilated.  4. Right atrial size was mildly dilated.  5. The mitral valve is abnormal. Mild to moderate mitral valve  regurgitation.  6. Tricuspid valve regurgitation is mild to moderate.  7. The aortic valve is tricuspid. Aortic valve regurgitation is trivial.  Severe aortic valve stenosis. Aortic valve area, by VTI measures 0.36 cm.  Aortic valve mean gradient measures 51.0 mmHg. Aortic valve Vmax measures  4.68 m/s.  8. The inferior vena cava is dilated in size with <50% respiratory  variability, suggesting right atrial pressure of 15 mmHg.   Comparison(s): Changes from prior study are noted. 01/13/2019: LVEF 60-65%,  severe AS - mean gradient 43 mmHg.    Patient Profile     86 y.o. female with PMH of HTN, CAD (CABG X3 26 yrs ago), DM-1(on insulin pump) and severe aortic stenosis. Admitted for acute on chronic diastolic heart failure exacerbation, complicated by acute kidney injury. Episode of flash pulmonary edema overnight 5/25-5/26.  Assessment & Plan    Acute on chronic diastolic heart failure, complicated by severe aortic stenosis, pulmonary hypertension, acute kidney injury -recurrent flash pulmonary edema, worse at night -remains on bipap this AM, though able to get to nasal cannula yesterday during the day. Will aim to transition her again today. If she cannot be weaned, may need to be transferred to ICU. -good output on 40 IV lasix yesterday. Renal function continues to improve.  -agree with 40 IV BID lasix today. Watch Cr closely. -weight today 54.7 kg, admission weight 54.4 kg.  -Cr today 1.33, down from 4.95 peak -Discussed with TAVR team. Given her tenuous fluid balance, planned for TAVR as inpatient next week. Will need to monitor closely until that  time.  Acute kidney injury:  -appreciate nephrology evaluation -suspected 2/2 contrast nephropathy given two CT scans and cath -Cr downtrended from 4.95 to 1.33 today -now being treated for UTI -cautious diuresis given pulmonary edema and Bipap requirement  Hypokalemia: K 3.2 today.  -ordered for total of 80 meq today  History of CAD s/p prior CABG: -medical management with statin -on aspirin 81 mg daily  Type I diabetes, on insulin pump: -per primary team  Hypertension: BP has been running lower generally, with rare elevations when she is short of breath -holding home losartan 100 mg daily given AKI -holding home metoprolol 50 mg BID. Heart rate has been generally well controlled except with flash pulmonary edema -home nifedipine 60 mg daily split to 30 mg q12hours with hold parameters per nephrology 5/23 -avoid hypotension given severe AS  Hypothyroidism: -on levothyroxine  With recurrent flash pulmonary edema and acute hypoxic respiratory failure, she is at high risk of decompensation. I agree with inpatient monitoring until TAVR as she is high risk of a poor outcome at home. May need to transfer to ICU if she cannot be weaned from bipap. Highly complex medical decision making with multiple comorbidities.  For questions or updates, please contact [facility] Please consult [email or web address] for contact info under     Signed, Buford Dresser, MD  09/08/2019, 10:59 AM

## 2019-09-09 LAB — CBC
HCT: 32 % — ABNORMAL LOW (ref 36.0–46.0)
Hemoglobin: 9.8 g/dL — ABNORMAL LOW (ref 12.0–15.0)
MCH: 28.9 pg (ref 26.0–34.0)
MCHC: 30.6 g/dL (ref 30.0–36.0)
MCV: 94.4 fL (ref 80.0–100.0)
Platelets: 268 10*3/uL (ref 150–400)
RBC: 3.39 MIL/uL — ABNORMAL LOW (ref 3.87–5.11)
RDW: 15.4 % (ref 11.5–15.5)
WBC: 7.3 10*3/uL (ref 4.0–10.5)
nRBC: 0 % (ref 0.0–0.2)

## 2019-09-09 LAB — BASIC METABOLIC PANEL
Anion gap: 11 (ref 5–15)
BUN: 32 mg/dL — ABNORMAL HIGH (ref 8–23)
CO2: 27 mmol/L (ref 22–32)
Calcium: 8.5 mg/dL — ABNORMAL LOW (ref 8.9–10.3)
Chloride: 101 mmol/L (ref 98–111)
Creatinine, Ser: 1.38 mg/dL — ABNORMAL HIGH (ref 0.44–1.00)
GFR calc Af Amer: 40 mL/min — ABNORMAL LOW (ref >60)
GFR calc non Af Amer: 34 mL/min — ABNORMAL LOW (ref >60)
Glucose, Bld: 120 mg/dL — ABNORMAL HIGH (ref 70–99)
Potassium: 3.8 mmol/L (ref 3.5–5.1)
Sodium: 139 mmol/L (ref 135–145)

## 2019-09-09 LAB — GLUCOSE, CAPILLARY
Glucose-Capillary: 127 mg/dL — ABNORMAL HIGH (ref 70–99)
Glucose-Capillary: 230 mg/dL — ABNORMAL HIGH (ref 70–99)
Glucose-Capillary: 179 mg/dL — ABNORMAL HIGH (ref 70–99)
Glucose-Capillary: 138 mg/dL — ABNORMAL HIGH (ref 70–99)

## 2019-09-09 MED ORDER — FUROSEMIDE 10 MG/ML IJ SOLN
40.0000 mg | Freq: Once | INTRAMUSCULAR | Status: AC
  Administered 2019-09-09: 40 mg via INTRAVENOUS
  Filled 2019-09-09: qty 4

## 2019-09-09 NOTE — Progress Notes (Signed)
Assessed patient this evening.  BS were Clear and Diminished, HR at 91, Rate at 27, O2 sat at 96% on 3L [place].  Patient did express that she would like to go back on Bipap tonight, she felt it was helping her.  I let her know that I would be back to put her on later.

## 2019-09-09 NOTE — Plan of Care (Signed)
  Problem: Pain Managment: Goal: General experience of comfort will improve Outcome: Completed/Met   Problem: Skin Integrity: Goal: Risk for impaired skin integrity will decrease Outcome: Completed/Met

## 2019-09-09 NOTE — Progress Notes (Signed)
Physical Therapy Treatment Patient Details Name: Nancy Mcguire MRN: [id number] DOB: 05-01-1929 Today's Date: 09/09/2019    History of Present Illness Pt is an 86 y/o female admitted secondary to CHF exacerbation. Was also found to have severe aortic stenosis and is being assessed for TAVR. Pt for heart cath on 09/11/2019. PMH includes HTN, CAD s/p CABG, CHF, and DM.  Rapid Response called for difficulty breathing, CXR revealed pulmonary edema and pt placed on BiPAP 5/26    PT Comments    Pt admitted with above diagnosis. Pt was able to ambulate with min assist in room with RW. Sats >90% on 6LO2 with ambulation. Pt definitely needs O2 at rest and with activity. Will continue to progress pt as able.  Pt currently with functional limitations due to the deficits listed below (see PT Problem List). Pt will benefit from skilled PT to increase their independence and safety with mobility to allow discharge to the venue listed below.     Follow Up Recommendations  Home health PT;Supervision for mobility/OOB     Equipment Recommendations  None recommended by PT    Recommendations for Other Services       Precautions / Restrictions Precautions Precautions: Fall Restrictions Weight Bearing Restrictions: No    Mobility  Bed Mobility Overal bed mobility: Needs Assistance Bed Mobility: Supine to Sit;Sit to Supine     Supine to sit: Min assist     General bed mobility comments: Min A for LE assist to return to supine.   Transfers Overall transfer level: Needs assistance Equipment used: Rolling walker (2 wheeled) Transfers: Sit to/from Stand Sit to Stand: Min guard         General transfer comment: Min guard for safety. No physical assist required. Assisted pt to 3N1 intiailly on standng as she urinated when she first stood up. Cleaned pt as well.    Ambulation/Gait Ambulation/Gait assistance: Min guard;Min assist Gait Distance (Feet): 45 Feet Assistive device: Rolling walker (2  wheeled) Gait Pattern/deviations: Step-through pattern;Decreased stride length;Trunk flexed;Drifts right/left Gait velocity: Decreased Gait velocity interpretation: <1.31 ft/sec, indicative of household ambulator General Gait Details: Slow, cautious gait with cues to stay close to the RW.  Pt also needed cues for hand placement.  No LOB noted. Pt walked on O2 as she was saturating 87-88% on 5L on arrival at rest.  O2 sats 96-100% with ambulation on 6L (tank wont do 5L).  Pt left on 5LO2 with sats 98%.     Stairs             Wheelchair Mobility    Modified Rankin (Stroke Patients Only)       Balance Overall balance assessment: Needs assistance Sitting-balance support: No upper extremity supported;Feet supported Sitting balance-Leahy Scale: Good     Standing balance support: Bilateral upper extremity supported;During functional activity Standing balance-Leahy Scale: Poor Standing balance comment: Reliant on BUE support                             Cognition Arousal/Alertness: Awake/alert Behavior During Therapy: WFL for tasks assessed/performed Overall Cognitive Status: Within Functional Limits for tasks assessed                                        Exercises      General Comments General comments (skin integrity, edema, etc.): Pt daughter present as well as  husband came in while PT finishing up.       Pertinent Vitals/Pain Pain Assessment: 0-10 Pain Score: 6  Pain Location: L flank Pain Descriptors / Indicators: ("catch") Pain Intervention(s): Limited activity within patient's tolerance;Monitored during session;Repositioned    Home Living                      Prior Function            PT Goals (current goals can now be found in the care plan section) Acute Rehab PT Goals Patient Stated Goal: to go home Progress towards PT goals: Progressing toward goals    Frequency    Min 3X/week      PT Plan Current plan  remains appropriate    Co-evaluation              AM-PAC PT "6 Clicks" Mobility   Outcome Measure  Help needed turning from your back to your side while in a flat bed without using bedrails?: None Help needed moving from lying on your back to sitting on the side of a flat bed without using bedrails?: None Help needed moving to and from a bed to a chair (including a wheelchair)?: A Little Help needed standing up from a chair using your arms (e.g., wheelchair or bedside chair)?: A Little Help needed to walk in hospital room?: A Little Help needed climbing 3-5 steps with a railing? : A Lot 6 Click Score: 19    End of Session Equipment Utilized During Treatment: Gait belt;Oxygen Activity Tolerance: Patient limited by fatigue Patient left: with call bell/phone within reach;with family/visitor present;in chair;with chair alarm set Nurse Communication: Mobility status PT Visit Diagnosis: Muscle weakness (generalized) (M62.81)     Time: 1275-1700 PT Time Calculation (min) (ACUTE ONLY): 34 min  Charges:  $Gait Training: 23-37 mins                     Nancy Mcguire,PT Acute Rehabilitation Services Pager:  [phone]  Office:  [phone]     Berline Lopes 09/09/2019, 1:21 PM

## 2019-09-09 NOTE — Progress Notes (Signed)
PROGRESS NOTE    Alaiza Yau  PYK:[id number] DOB: [birth year] DOA: 08/25/2019 PCP: Angelica Chessman, MD   Brief Narrative: 86 year old with past medical history significant for diastolic heart failure not on diuretics, moderate to severe pulmonary hypertension, severe aortic stenosis, heart murmur, CAD, carotid artery stenosis, CKD stage III AAA, diabetes type 2 on insulin pump, hypothyroidism, chronic back pain who presented with worsening shortness of breath and dry cough. Patient was admitted with acute on chronic diastolic heart failure exacerbation, chest x-ray showed cardiomegaly and small bilateral pleural effusion, CTA negative for PE, BNP 1024.  Patient acute heart failure exacerbation was treated with IV Laxis, subsequently she was transitioned to oral Lasix.  She underwent evaluation for aortic valve replacement (TAVAR), cardiac cath and CT chest with contrast.  Hospital course complicated by acute renal failure secondary to contrast nephropathy.  Patient received IV fluid transiently.  Creatinine continued to increase, nephrology consulted.    Assessment & Plan:   Acute on chronic diastolic heart failure  Acute on chronic hypoxic respiratory failure severe pulmonary hypertension Severe aortic stenosis Echo with ejection fraction 60 to 65%, severe aortic valve stenosis -Diuresed with IV Lasix and then diuretics were held in the setting of AKI -Recurrent flash pulmonary edema requiring BiPAP two nights in a row -Cardiology following, plan to keep inpatient until TAVR early next week -Continue IV Lasix today, with the goal to keep net negative, she is -3.0 L, currently on 5 to 6 L of O2 -Kidney function is stable -nifedipine stopped with soft/low blood pressures to avoid hypotension -Resume beta-blocker at low-dose hopefully tomorrow  Aortic Valve Severe Stenosis:  CAD/CABG  cardiology consulting Cardiology has consulted TAVR team for further evaluation. Had Cath: LIMA and  Circumflex graft open, diagonal graft with obstruction.  Underwent  CT scans, CVTS sx consulted , TAVR preproned to early next week  Acute on CKD stage III a; Contrast nephropathy -Creatinine peaked at 4.9 -Now improving -Was briefly hydrated with IVs fluids as well, renal ultrasound without hydronephrosis -Appreciate nephrology input -Kidney function improving, diuresed with IV Lasix -Creatinine is stable  E. coli UTI -Completed 3 days of IV ceftriaxone followed by 1 day of Keflex, continue Keflex for one more day  Controlled diabetes type 1 with hyperglycemia: -CBGs more stable, continue Lantus and meal coverage insulin, resume insulin pump at discharge  Essential hypertension: Losartan, metoprolol and nifedipine on hold  Hypothyroidism continue with Synthroid.  Hypomagnesemia: Replete.  Estimated body mass index is 20.69 kg/m as calculated from the following:   Height as of this encounter: 5\' 2"  (1.575 m).   Weight as of this encounter: 51.3 kg.   DVT prophylaxis: Lovenox Code Status: DNR DNI Family Communication: Daughter at bedside  Disposition Plan:  Status is: Inpatient Recurrent flash pulmonary edema in the setting of CAD and severe AS improving acute kidney injury, plan for DC after TAVR on Tuesday  Dispo: The patient is from: Home              Anticipated d/c is to: Home               Anticipated d/c date is: 1 week due to recurrent pulmonary edema requiring BiPAP, plan for TAVR early next week              Patient currently; not medical stable for discharge        Consultants:   Cardiology  Procedures:   Echo  Antimicrobials:  None  Subjective: -Taken off BiPAP earlier  this morning, requests to have it back on, currently on 6 L of O2  Objective: Vitals:   09/09/19 0717 09/09/19 0800 09/09/19 0952 09/09/19 1101  BP: 121/75 111/77  115/67  Pulse: (!) 103 (!) 101  (!) 104  Resp: (!) 22 (!) 21 18   Temp: 97.9 F (36.6 C)   97.6 F (36.4 C)    TempSrc: Oral   Oral  SpO2: 93% 93%    Weight:      Height:        Intake/Output Summary (Last 24 hours) at 09/09/2019 1420 Last data filed at 09/09/2019 1409 Gross per 24 hour  Intake 1180 ml  Output 1125 ml  Net 55 ml   Filed Weights   09/07/19 0302 09/08/19 0345 09/09/19 0024  Weight: 54 kg 54.7 kg 51.3 kg    Examination:  General exam: Elderly frail chronically ill female sitting up in bed, awake alert off BiPAP, on nasal cannula, no distress HEENT: Positive JVD CVS S1-S2 regular rhythm, loud systolic ejection murmur Lungs with fine basilar crackles Abdomen is soft nontender with positive bowel sounds Extremities with no edema Neuro moves all extremities, no localizing signs   Data Reviewed: I have personally reviewed following labs and imaging studies  CBC: Recent Labs  Lab 09/03/19 0802 09/04/19 0728 09/06/19 0816 09/08/19 0446 09/09/19 0419  WBC 9.1 8.1 6.0 7.2 7.3  HGB 9.8* 9.5* 10.4* 9.8* 9.8*  HCT 31.4* 30.1* 32.3* 30.8* 32.0*  MCV 94.9 94.1 90.7 91.4 94.4  PLT 191 195 260 251 973   Basic Metabolic Panel: Recent Labs  Lab 09/06/19 0816 09/07/19 0550 09/07/19 1634 09/08/19 0446 09/09/19 0419  NA 135 136 136 139 139  K 2.8* 3.6 3.4* 3.2* 3.8  CL 100 102 102 103 101  CO2 22 22 22 26 27   GLUCOSE 226* 241* 237* 98 120*  BUN 58* 43* 35* 31* 32*  CREATININE 2.88* 1.82* 1.50* 1.33* 1.38*  CALCIUM 8.1* 8.2* 7.9* 8.5* 8.5*  PHOS 2.9  --   --   --   --    GFR: Estimated Creatinine Clearance: 22.7 mL/min (A) (by C-G formula based on SCr of 1.38 mg/dL (H)). Liver Function Tests: Recent Labs  Lab 09/06/19 0816  ALBUMIN 2.9*   No results for input(s): LIPASE, AMYLASE in the last 168 hours. No results for input(s): AMMONIA in the last 168 hours. Coagulation Profile: No results for input(s): INR, PROTIME in the last 168 hours. Cardiac Enzymes: No results for input(s): CKTOTAL, CKMB, CKMBINDEX, TROPONINI in the last 168 hours. BNP (last 3  results) No results for input(s): PROBNP in the last 8760 hours. HbA1C: No results for input(s): HGBA1C in the last 72 hours. CBG: Recent Labs  Lab 09/08/19 1645 09/08/19 2059 09/08/19 2130 09/09/19 0615 09/09/19 1103  GLUCAP 201* 84 107* 127* 230*   Lipid Profile: No results for input(s): CHOL, HDL, LDLCALC, TRIG, CHOLHDL, LDLDIRECT in the last 72 hours. Thyroid Function Tests: No results for input(s): TSH, T4TOTAL, FREET4, T3FREE, THYROIDAB in the last 72 hours. Anemia Panel: No results for input(s): VITAMINB12, FOLATE, FERRITIN, TIBC, IRON, RETICCTPCT in the last 72 hours. Sepsis Labs: No results for input(s): PROCALCITON, LATICACIDVEN in the last 168 hours.  Recent Results (from the past 240 hour(s))  Urine Culture     Status: Abnormal   Collection Time: 09/05/19 11:40 AM   Specimen: Urine, Random  Result Value Ref Range Status   Specimen Description URINE, RANDOM  Final   Special Requests  Final    NONE Performed at [facility], [address], [place], [place] [address]    Culture >=100,000 COLONIES/mL ESCHERICHIA COLI (A)  Final   Report Status 09/07/2019 FINAL  Final   Organism ID, Bacteria ESCHERICHIA COLI (A)  Final      Susceptibility   Escherichia coli - MIC*    AMPICILLIN 4 SENSITIVE Sensitive     CEFAZOLIN <=4 SENSITIVE Sensitive     CEFTRIAXONE <=1 SENSITIVE Sensitive     CIPROFLOXACIN <=0.25 SENSITIVE Sensitive     GENTAMICIN <=1 SENSITIVE Sensitive     IMIPENEM <=0.25 SENSITIVE Sensitive     NITROFURANTOIN <=16 SENSITIVE Sensitive     TRIMETH/SULFA <=20 SENSITIVE Sensitive     AMPICILLIN/SULBACTAM <=2 SENSITIVE Sensitive     PIP/TAZO <=4 SENSITIVE Sensitive     * >=100,000 COLONIES/mL ESCHERICHIA COLI         Radiology Studies: DG CHEST PORT 1 VIEW  Result Date: 09/08/2019 CLINICAL DATA:  Shortness of breath. EXAM: PORTABLE CHEST 1 VIEW COMPARISON:  09/07/2019 FINDINGS: The heart is mildly enlarged. The mediastinal and hilar contours  are within normal limits and stable. There is moderate central vascular congestion and mild interstitial edema. Stable moderate eventration of the left hemidiaphragm with overlying vascular crowding and atelectasis. No pneumothorax. IMPRESSION: Persistent changes of CHF. Electronically Signed   By: Rudie Meyer M.D.   On: 09/08/2019 08:10        Scheduled Meds: . aspirin  81 mg Oral Daily  . cephALEXin  250 mg Oral Q8H  . heparin injection (subcutaneous)  5,000 Units Subcutaneous Q8H  . insulin aspart  0-5 Units Subcutaneous QHS  . insulin aspart  0-6 Units Subcutaneous TID WC  . insulin aspart  4 Units Subcutaneous TID WC  . insulin glargine  15 Units Subcutaneous Q1400  . levothyroxine  50 mcg Oral QAC breakfast  . senna-docusate  1 tablet Oral BID  . simvastatin  20 mg Oral q1800  . sodium chloride flush  3 mL Intravenous Q12H   Continuous Infusions: . sodium chloride 250 mL (09/08/19 0852)     LOS: 11 days    Time spent: 25 minutes  Zannie Cove, MD [facility]   09/09/2019, 2:20 PM

## 2019-09-09 NOTE — Progress Notes (Signed)
Progress Note  Patient Name: Nancy Mcguire Date of Encounter: 09/09/2019  Primary Cardiologist: Quay Burow, MD   Subjective   On nasal cannula this AM. Had some mild confusion early AM but she was aware she was confused, now back to baseline. Blood pressures have been borderline hypotensive. Cr stable with diuresis yesterday. Reviewed plan for TAVR first case 10/06/2019.  Inpatient Medications    Scheduled Meds: . aspirin  81 mg Oral Daily  . cephALEXin  250 mg Oral Q8H  . furosemide  40 mg Intravenous Once  . heparin injection (subcutaneous)  5,000 Units Subcutaneous Q8H  . insulin aspart  0-5 Units Subcutaneous QHS  . insulin aspart  0-6 Units Subcutaneous TID WC  . insulin aspart  4 Units Subcutaneous TID WC  . insulin glargine  15 Units Subcutaneous Q1400  . levothyroxine  50 mcg Oral QAC breakfast  . senna-docusate  1 tablet Oral BID  . simvastatin  20 mg Oral q1800  . sodium chloride flush  3 mL Intravenous Q12H   Continuous Infusions: . sodium chloride 250 mL (09/08/19 0852)   PRN Meds: sodium chloride, acetaminophen, lactulose, ondansetron (ZOFRAN) IV, sodium chloride flush   Vital Signs    Vitals:   09/09/19 0600 09/09/19 0717 09/09/19 0800 09/09/19 0952  BP: 127/68 121/75 111/77   Pulse: (!) 102 (!) 103 (!) 101   Resp: (!) 31 (!) 22 (!) 21 18  Temp:  97.9 F (36.6 C)    TempSrc:  Oral    SpO2: 94% 93% 93%   Weight:      Height:        Intake/Output Summary (Last 24 hours) at 09/09/2019 1058 Last data filed at 09/09/2019 1610 Gross per 24 hour  Intake 930 ml  Output 1695 ml  Net -765 ml   Last 3 Weights 09/09/2019 09/08/2019 09/07/2019  Weight (lbs) 113 lb 1.5 oz 120 lb 9.5 oz 119 lb 0.8 oz  Weight (kg) 51.3 kg 54.7 kg 54 kg      Telemetry    Sinus rhythm/sinus tachycardia- Personally Reviewed  ECG    09/07/19 computer read as afib RVR. Based on ECG and telemetry, appears more consistent with sinus tachycardia with PACs - Personally  Reviewed  Physical Exam   GEN: frail appearing, nasal cannula in place HEENT: Normal, moist mucous membranes NECK: No JVD visible at 60 degrees CARDIAC: regular rhythm, normal S1 and S2, no rubs or gallops. 4/6 SE murmur. VASCULAR: Radial and DP pulses 2+ bilaterally.  RESPIRATORY:  Clear to auscultation in upper air fields with bilateral crackles at bases. ABDOMEN: Soft, non-tender, non-distended MUSCULOSKELETAL:  Moves all 4 limbs independently SKIN: Warm and dry, no edema NEUROLOGIC:  Alert and oriented x 3. No focal neuro deficits noted. PSYCHIATRIC:  Normal affect   Labs    High Sensitivity Troponin:   Recent Labs  Lab 08/17/2019 1220 08/24/2019 1539  TROPONINIHS 21* 26*      Chemistry Recent Labs  Lab 09/06/19 0816 09/07/19 0550 09/07/19 1634 09/08/19 0446 09/09/19 0419  NA 135   < > 136 139 139  K 2.8*   < > 3.4* 3.2* 3.8  CL 100   < > 102 103 101  CO2 22   < > 22 26 27   GLUCOSE 226*   < > 237* 98 120*  BUN 58*   < > 35* 31* 32*  CREATININE 2.88*   < > 1.50* 1.33* 1.38*  CALCIUM 8.1*   < > 7.9* 8.5* 8.5*  ALBUMIN 2.9*  --   --   --   --   GFRNONAA 14*   < > 31* 36* 34*  GFRAA 16*   < > 36* 42* 40*  ANIONGAP 13   < > 12 10 11    < > = values in this interval not displayed.     Hematology Recent Labs  Lab 09/06/19 0816 09/08/19 0446 09/09/19 0419  WBC 6.0 7.2 7.3  RBC 3.56* 3.37* 3.39*  HGB 10.4* 9.8* 9.8*  HCT 32.3* 30.8* 32.0*  MCV 90.7 91.4 94.4  MCH 29.2 29.1 28.9  MCHC 32.2 31.8 30.6  RDW 14.9 15.3 15.4  PLT 260 251 268    BNP No results for input(s): BNP, PROBNP in the last 168 hours.   DDimer No results for input(s): DDIMER in the last 168 hours.   Radiology    DG CHEST PORT 1 VIEW  Result Date: 09/08/2019 CLINICAL DATA:  Shortness of breath. EXAM: PORTABLE CHEST 1 VIEW COMPARISON:  09/07/2019 FINDINGS: The heart is mildly enlarged. The mediastinal and hilar contours are within normal limits and stable. There is moderate central  vascular congestion and mild interstitial edema. Stable moderate eventration of the left hemidiaphragm with overlying vascular crowding and atelectasis. No pneumothorax. IMPRESSION: Persistent changes of CHF. Electronically Signed   By: 09/09/2019 M.D.   On: 09/08/2019 08:10    Cardiac Studies   Cath 08/19/2019  Prox LAD to Mid LAD lesion is 90% stenosed.  Mid LAD-1 lesion is 90% stenosed.  Mid LAD-2 lesion is 90% stenosed.  Origin to Prox Graft lesion is 100% stenosed.  Ost Cx to Prox Cx lesion is 90% stenosed.  2nd Mrg lesion is 90% stenosed.  Mid Graft to Dist Graft lesion is 60% stenosed.  Prox Graft to Mid Graft lesion is 40% stenosed.  Hemodynamic findings consistent with aortic valve stenosis.  IMPRESSION: Ms. Madonia has 2 of 3 grafts patent in the left dominant system.  The vein graft to the diagonal branch is occluded at the origin.  The LIMA to the LAD is patent with significant proximal LAD disease proximal to LIMA insertion.  The vein graft to the circumflex has moderate disease in the mid and distal shaft.  The RCA is nondominant.  She has severe aortic stenosis with a valve area between 0.6 1.8 by Fick or thermal dilution and EDP of 18.  I did shoot her distal abdominal aorta revealing widely patent iliacs arteries.  I reviewed the films with Dr. Ave Filter who feels that she is a acceptable candidate for TAVR.  I performed angiography of the right common femoral artery and and successfully deployed a MYNX closure device in the right common femoral artery and vein achieving hemostasis.  The patient left lab in stable condition.  Hemodynamic findings consistent with aortic valve stenosis. Right atrial pressure-5/7 Right ventricular pressure 56 systolic Pulmonary artery pressure-54/17, mean 31 Pulmonary wedge pressure-A-wave 14, V wave 15, mean 12 LVEDP-18 Strict cardiac output-4.9 L/min with an index of 3.3 L/min/m by Fick, 5.6 L/min with an index of 4.4 L/min/m by  thermodilution Aortic valve area-0.81 cm by thermodilution, 0.61 cm by Fick  Echo 08/30/19 1. Left ventricular ejection fraction, by estimation, is 60 to 65%. The  left ventricle has normal function. The left ventricle has no regional  wall motion abnormalities. There is mild left ventricular hypertrophy.  Left ventricular diastolic parameters  are consistent with Grade II diastolic dysfunction (pseudonormalization).  Elevated left ventricular end-diastolic pressure.  2. Right  ventricular systolic function is mildly reduced. The right  ventricular size is normal. There is severely elevated pulmonary artery  systolic pressure. The estimated right ventricular systolic pressure is  82.2 mmHg.  3. Left atrial size was mildly dilated.  4. Right atrial size was mildly dilated.  5. The mitral valve is abnormal. Mild to moderate mitral valve  regurgitation.  6. Tricuspid valve regurgitation is mild to moderate.  7. The aortic valve is tricuspid. Aortic valve regurgitation is trivial.  Severe aortic valve stenosis. Aortic valve area, by VTI measures 0.36 cm.  Aortic valve mean gradient measures 51.0 mmHg. Aortic valve Vmax measures  4.68 m/s.  8. The inferior vena cava is dilated in size with <50% respiratory  variability, suggesting right atrial pressure of 15 mmHg.   Comparison(s): Changes from prior study are noted. 01/13/2019: LVEF 60-65%,  severe AS - mean gradient 43 mmHg.    Patient Profile     86 y.o. female with PMH of HTN, CAD (CABG X3 26 yrs ago), DM-1(on insulin pump) and severe aortic stenosis. Admitted for acute on chronic diastolic heart failure exacerbation, complicated by acute kidney injury. Episode of flash pulmonary edema overnight 5/25-5/26.  Assessment & Plan    Acute on chronic diastolic heart failure, complicated by severe aortic stenosis, pulmonary hypertension, acute kidney injury -recurrent flash pulmonary edema, worse at night -has been weaned to  bipap with sleep/nasal cannula when awake today -good output on 40 IV lasix BID yesterday. Will dose again today with just one lasix dose to try to optimize volume status and keep her off Bipap as much as possible. Limited based on blood pressure and renal function. -weight today 51.3 kg, admission weight 54.4 kg. Net negative 3L. -Cr today 1.38, down from 4.95 peak -Discussed with TAVR team. Given her tenuous fluid balance, planned for TAVR as inpatient next week. Will need to monitor closely until that time.  Acute kidney injury:  -appreciate nephrology evaluation -suspected 2/2 contrast nephropathy given two CT scans and cath -Cr downtrended from 4.95 to 1.38 today -now being treated for UTI -cautious diuresis given pulmonary edema and Bipap requirement  Hypokalemia: K 3.8 today.  -monitor closely with diuresis  History of CAD s/p prior CABG: -medical management with statin -on aspirin 81 mg daily  Type I diabetes, on insulin pump: -per primary team  Hypertension: BP has been running lower generally, with rare elevations when she is short of breath -holding home losartan 100 mg daily given AKI -holding home metoprolol 50 mg BID. Heart rate has been generally well controlled except with flash pulmonary edema -home nifedipine 60 mg daily held  Hypothyroidism: -on levothyroxine  For questions or updates, please contact [facility] Please consult [email or web address] for contact info under     Signed, Jodelle Red, MD  09/09/2019, 10:58 AM

## 2019-09-10 LAB — GLUCOSE, CAPILLARY
Glucose-Capillary: 100 mg/dL — ABNORMAL HIGH (ref 70–99)
Glucose-Capillary: 249 mg/dL — ABNORMAL HIGH (ref 70–99)
Glucose-Capillary: 213 mg/dL — ABNORMAL HIGH (ref 70–99)
Glucose-Capillary: 135 mg/dL — ABNORMAL HIGH (ref 70–99)

## 2019-09-10 MED ORDER — POTASSIUM CHLORIDE CRYS ER 20 MEQ PO TBCR
40.0000 meq | EXTENDED_RELEASE_TABLET | Freq: Once | ORAL | Status: AC
  Administered 2019-09-10: 40 meq via ORAL
  Filled 2019-09-10: qty 2

## 2019-09-10 MED ORDER — SODIUM CHLORIDE 0.9 % IV SOLN
INTRAVENOUS | Status: DC
  Filled 2019-09-10: qty 30

## 2019-09-10 MED ORDER — SODIUM CHLORIDE 0.9 % IV SOLN
1.5000 g | INTRAVENOUS | Status: AC
  Administered 2019-09-13: 1.5 g via INTRAVENOUS
  Filled 2019-09-10: qty 1.5

## 2019-09-10 MED ORDER — FUROSEMIDE 10 MG/ML IJ SOLN
60.0000 mg | Freq: Two times a day (BID) | INTRAMUSCULAR | Status: AC
  Administered 2019-09-10 (×2): 60 mg via INTRAVENOUS
  Filled 2019-09-10 (×2): qty 6

## 2019-09-10 MED ORDER — NOREPINEPHRINE 4 MG/250ML-% IV SOLN
0.0000 ug/min | INTRAVENOUS | Status: AC
  Administered 2019-09-13: 1 ug/min via INTRAVENOUS
  Filled 2019-09-10: qty 250

## 2019-09-10 MED ORDER — FUROSEMIDE 10 MG/ML IJ SOLN: 40.0000 mg | Freq: Two times a day (BID) | INTRAMUSCULAR | Status: DC

## 2019-09-10 MED ORDER — POTASSIUM CHLORIDE 2 MEQ/ML IV SOLN
80.0000 meq | INTRAVENOUS | Status: DC
  Filled 2019-09-10: qty 40

## 2019-09-10 MED ORDER — MAGNESIUM SULFATE 50 % IJ SOLN
40.0000 meq | INTRAMUSCULAR | Status: DC
  Filled 2019-09-10: qty 9.85

## 2019-09-10 MED ORDER — POTASSIUM CHLORIDE CRYS ER 20 MEQ PO TBCR
40.0000 meq | EXTENDED_RELEASE_TABLET | Freq: Two times a day (BID) | ORAL | Status: AC
  Administered 2019-09-10 – 2019-09-11 (×3): 40 meq via ORAL
  Filled 2019-09-10 (×3): qty 2

## 2019-09-10 MED ORDER — DEXMEDETOMIDINE HCL IN NACL 400 MCG/100ML IV SOLN
0.1000 ug/kg/h | INTRAVENOUS | Status: AC
  Administered 2019-09-13: 1 ug/kg/h via INTRAVENOUS
  Filled 2019-09-10: qty 100

## 2019-09-10 MED ORDER — VANCOMYCIN HCL IN DEXTROSE 1-5 GM/200ML-% IV SOLN
1000.0000 mg | INTRAVENOUS | Status: AC
  Administered 2019-09-13: 1000 mg via INTRAVENOUS
  Filled 2019-09-10: qty 200

## 2019-09-10 NOTE — Progress Notes (Signed)
Cardiology Progress Note  Patient ID: Nancy Mcguire MRN: [id number] DOB: [birth year] Date of Encounter: 09/10/2019  Primary Cardiologist: Nanetta Batty, MD  Subjective   Chief Complaint: SOB  HPI: Was on BiPAP last night. -945 cc. Reports sill SOB.   ROS:  All other ROS reviewed and negative. Pertinent positives noted in the HPI.     Inpatient Medications  Scheduled Meds: . aspirin  81 mg Oral Daily  . cephALEXin  250 mg Oral Q8H  . furosemide  60 mg Intravenous BID  . heparin injection (subcutaneous)  5,000 Units Subcutaneous Q8H  . insulin aspart  0-5 Units Subcutaneous QHS  . insulin aspart  0-6 Units Subcutaneous TID WC  . insulin aspart  4 Units Subcutaneous TID WC  . insulin glargine  15 Units Subcutaneous Q1400  . levothyroxine  50 mcg Oral QAC breakfast  . potassium chloride  40 mEq Oral Once  . senna-docusate  1 tablet Oral BID  . simvastatin  20 mg Oral q1800  . sodium chloride flush  3 mL Intravenous Q12H   Continuous Infusions: . sodium chloride 250 mL (09/08/19 0852)   PRN Meds: sodium chloride, acetaminophen, lactulose, ondansetron (ZOFRAN) IV, sodium chloride flush   Vital Signs   Vitals:   09/09/19 2242 09/10/19 0000 09/10/19 0503 09/10/19 0700  BP: 116/71 114/64 118/81   Pulse: 91 85 87   Resp: (!) 24 20 18    Temp:   98.3 F (36.8 C) 98.6 F (37 C)  TempSrc:   Axillary Oral  SpO2: 100% 100% 99%   Weight:   50.6 kg   Height:        Intake/Output Summary (Last 24 hours) at 09/10/2019 0857 Last data filed at 09/09/2019 2131 Gross per 24 hour  Intake 460 ml  Output 600 ml  Net -140 ml   Last 3 Weights 09/10/2019 09/09/2019 09/08/2019  Weight (lbs) 111 lb 8.8 oz 113 lb 1.5 oz 120 lb 9.5 oz  Weight (kg) 50.6 kg 51.3 kg 54.7 kg      Telemetry  Overnight telemetry shows ST 100s, which I personally reviewed.   ECG  The most recent ECG shows ST 24 old inferior infarct, which I personally reviewed.   Physical Exam   Vitals:   09/09/19 2242  09/10/19 0000 09/10/19 0503 09/10/19 0700  BP: 116/71 114/64 118/81   Pulse: 91 85 87   Resp: (!) 24 20 18    Temp:   98.3 F (36.8 C) 98.6 F (37 C)  TempSrc:   Axillary Oral  SpO2: 100% 100% 99%   Weight:   50.6 kg   Height:         Intake/Output Summary (Last 24 hours) at 09/10/2019 0857 Last data filed at 09/09/2019 2131 Gross per 24 hour  Intake 460 ml  Output 600 ml  Net -140 ml    Last 3 Weights 09/10/2019 09/09/2019 09/08/2019  Weight (lbs) 111 lb 8.8 oz 113 lb 1.5 oz 120 lb 9.5 oz  Weight (kg) 50.6 kg 51.3 kg 54.7 kg    Body mass index is 20.4 kg/m.   General: SOB Head: Atraumatic, normal size  Eyes: PEERLA, EOMI  Neck: Supple, +JVD Endocrine: No thryomegaly Cardiac: 3/6 SEM, late peaking  Lungs: rales Abd: Soft, nontender, no hepatomegaly  Ext: trace edema  Musculoskeletal: No deformities, BUE and BLE strength normal and equal Skin: Warm and dry, no rashes   Neuro: Alert and oriented to person, place, time, and situation, CNII-XII grossly intact, no focal deficits  Psych: Normal mood and affect   Labs  High Sensitivity Troponin:   Recent Labs  Lab 08/16/2019 1220 08/15/2019 1539  TROPONINIHS 21* 26*     Cardiac EnzymesNo results for input(s): TROPONINI in the last 168 hours. No results for input(s): TROPIPOC in the last 168 hours.  Chemistry Recent Labs  Lab 09/06/19 0816 09/07/19 0550 09/07/19 1634 09/08/19 0446 09/09/19 0419  NA 135   < > 136 139 139  K 2.8*   < > 3.4* 3.2* 3.8  CL 100   < > 102 103 101  CO2 22   < > 22 26 27   GLUCOSE 226*   < > 237* 98 120*  BUN 58*   < > 35* 31* 32*  CREATININE 2.88*   < > 1.50* 1.33* 1.38*  CALCIUM 8.1*   < > 7.9* 8.5* 8.5*  ALBUMIN 2.9*  --   --   --   --   GFRNONAA 14*   < > 31* 36* 34*  GFRAA 16*   < > 36* 42* 40*  ANIONGAP 13   < > 12 10 11    < > = values in this interval not displayed.    Hematology Recent Labs  Lab 09/06/19 0816 09/08/19 0446 09/09/19 0419  WBC 6.0 7.2 7.3  RBC 3.56* 3.37* 3.39*   HGB 10.4* 9.8* 9.8*  HCT 32.3* 30.8* 32.0*  MCV 90.7 91.4 94.4  MCH 29.2 29.1 28.9  MCHC 32.2 31.8 30.6  RDW 14.9 15.3 15.4  PLT 260 251 268   BNPNo results for input(s): BNP, PROBNP in the last 168 hours.  DDimer No results for input(s): DDIMER in the last 168 hours.   Radiology  No results found.  Cardiac Studies  TTE 08/30/2019 1. Left ventricular ejection fraction, by estimation, is 60 to 65%. The  left ventricle has normal function. The left ventricle has no regional  wall motion abnormalities. There is mild left ventricular hypertrophy.  Left ventricular diastolic parameters  are consistent with Grade II diastolic dysfunction (pseudonormalization).  Elevated left ventricular end-diastolic pressure.  2. Right ventricular systolic function is mildly reduced. The right  ventricular size is normal. There is severely elevated pulmonary artery  systolic pressure. The estimated right ventricular systolic pressure is  82.2 mmHg.  3. Left atrial size was mildly dilated.  4. Right atrial size was mildly dilated.  5. The mitral valve is abnormal. Mild to moderate mitral valve  regurgitation.  6. Tricuspid valve regurgitation is mild to moderate.  7. The aortic valve is tricuspid. Aortic valve regurgitation is trivial.  Severe aortic valve stenosis. Aortic valve area, by VTI measures 0.36 cm.  Aortic valve mean gradient measures 51.0 mmHg. Aortic valve Vmax measures  4.68 m/s.  8. The inferior vena cava is dilated in size with <50% respiratory  variability, suggesting right atrial pressure of 15 mmHg.   LHC 09/08/2019  Prox LAD to Mid LAD lesion is 90% stenosed.  Mid LAD-1 lesion is 90% stenosed.  Mid LAD-2 lesion is 90% stenosed.  Origin to Prox Graft lesion is 100% stenosed.  Ost Cx to Prox Cx lesion is 90% stenosed.  2nd Mrg lesion is 90% stenosed.  Mid Graft to Dist Graft lesion is 60% stenosed.  Prox Graft to Mid Graft lesion is 40%  stenosed.  Hemodynamic findings consistent with aortic valve stenosis.  Patient Profile  Nancy Mcguire is a 86 y.o. female with CAD s/p CABG, DM, HTN, severe AS admitted 09/06/2019 for acute pulmonary edema 2/2  severe AS.   Assessment & Plan   1. Severe Aortic Stenosis/Hypoxic Respiratory Failure/Pulmonary Edema/Diastolic HF -volume overload due to severe AS. Awaiting TAVR 6/1 -net negative 3L -still volume up and SOB; increase lasix to 60 mg IV BID -Cr coming down -continue optimization for TAVR with volume removal  2. AKI -diuresis as above. Improving  3. CAD s/p CABG -ASA  For questions or updates, please contact [place] Please consult [email or web address] for contact info under   Time Spent with Patient: I have spent a total of 25 minutes with patient reviewing hospital notes, telemetry, EKGs, labs and examining the patient as well as establishing an assessment and plan that was discussed with the patient.  > 50% of time was spent in direct patient care.    Signed, Addison Naegeli. Audie Box, [place]  09/10/2019 8:57 AM

## 2019-09-10 NOTE — Progress Notes (Signed)
PROGRESS NOTE    Nancy Mcguire  UDJ:[id number] DOB: [birth year] DOA: 08/20/2019 PCP: Angelica Chessman, MD   Brief Narrative: 86 year old with past medical history significant for diastolic heart failure not on diuretics, moderate to severe pulmonary hypertension, severe aortic stenosis, heart murmur, CAD, carotid artery stenosis, CKD stage III AAA, diabetes type 2 on insulin pump, hypothyroidism, chronic back pain who presented with worsening shortness of breath and dry cough. Patient was admitted with acute on chronic diastolic heart failure exacerbation, chest x-ray showed cardiomegaly and small bilateral pleural effusion, CTA negative for PE, BNP 1024. Acute heart failure exacerbation was treated with IV Laxis, subsequently she was transitioned to oral Lasix.  She underwent evaluation for aortic valve replacement (TAVR), cardiac cath and CT chest with contrast.  Hospital course complicated by acute renal failure secondary to contrast nephropathy.  Patient received IV fluid transiently. [facility] course complicated by recurrent flash pulmonary edema requiring BiPAP off and on for several days in a row, now plan for TAVR has been preponed  Assessment & Plan:   Acute on chronic diastolic heart failure  Acute on chronic hypoxic respiratory failure severe pulmonary hypertension Severe aortic stenosis Echo with ejection fraction 60 to 65%, severe aortic valve stenosis -Diuresed with IV Lasix and then diuretics were held in the setting of AKI -Recurrent flash pulmonary edema requiring BiPAP two nights in a row -Cardiology following, plan to keep inpatient until TAVR early next week -Clinically with Rales and mild dyspnea, continue IV Lasix today, net -3 L  -Currently on 3 to 4 L of O2, kidney function is stable -nifedipine stopped with soft/low blood pressures to avoid hypotension -?  Restart low-dose beta-blocker  Aortic Valve Severe Stenosis:  CAD/CABG  cardiology consulting Cardiology  has consulted TAVR team for further evaluation. Had Cath: LIMA and Circumflex graft open, diagonal graft with obstruction.  Underwent  CT scans, CVTS sx consulted , TAVR preproned to early next week  Acute on CKD stage III a; Contrast nephropathy -Creatinine peaked at 4.9 -Now improving -Was briefly hydrated with IVs fluids as well, renal ultrasound without hydronephrosis -Appreciate nephrology input -Kidney function improving, diuresed with IV Lasix -Creatinine is stable  E. coli UTI -Completed 3 days of IV ceftriaxone followed by 2 days of Keflex, discontinue antibiotics today  Controlled diabetes type 1 with hyperglycemia: -CBGs more stable, continue Lantus and meal coverage insulin, resume insulin pump at discharge  Essential hypertension: Losartan, metoprolol and nifedipine on hold  Hypothyroidism continue with Synthroid.  Hypomagnesemia: Replete.  Estimated body mass index is 20.4 kg/m as calculated from the following:   Height as of this encounter: 5\' 2"  (1.575 m).   Weight as of this encounter: 50.6 kg.   DVT prophylaxis: Lovenox Code Status: DNR DNI Family Communication: No family at bedside, discussed with daughter 5/28 Disposition Plan:  Status is: Inpatient Recurrent flash pulmonary edema in the setting of CAD and severe AS improving acute kidney injury, plan for DC after TAVR on Tuesday  Dispo: The patient is from: Home              Anticipated d/c is to: Home               Anticipated d/c date is: 1 week due to recurrent pulmonary edema requiring BiPAP, plan for TAVR early next week              Patient currently; not medical stable for discharge   Consultants:   Cardiology  Procedures:   Echo  Antimicrobials:  None  Subjective: -Put back on BiPAP overnight, currently down to 3 to 4 L of O2, reports mild dyspnea with activity  Objective: Vitals:   09/10/19 0000 09/10/19 0503 09/10/19 0700 09/10/19 0900  BP: 114/64 118/81  117/64  Pulse: 85  87  100  Resp: 20 18    Temp:  98.3 F (36.8 C) 98.6 F (37 C)   TempSrc:  Axillary Oral   SpO2: 100% 99%  97%  Weight:  50.6 kg    Height:        Intake/Output Summary (Last 24 hours) at 09/10/2019 1114 Last data filed at 09/10/2019 0834 Gross per 24 hour  Intake 580 ml  Output 600 ml  Net -20 ml   Filed Weights   09/08/19 0345 09/09/19 0024 09/10/19 0503  Weight: 54.7 kg 51.3 kg 50.6 kg    Examination:  General exam: Elderly frail chronically ill female sitting up in bed, off BiPAP now, no distress HEENT: Positive JVD CVS S1-S2 regular rhythm, loud systolic ejection murmur Lungs with bilateral lower lobe Rales Abdomen is soft nontender with positive bowel sounds Extremities with no edema Neuro moves all extremities, no localizing signs  Psych: Appropriate mood and affect  Data Reviewed: I have personally reviewed following labs and imaging studies  CBC: Recent Labs  Lab 09/04/19 0728 09/06/19 0816 09/08/19 0446 09/09/19 0419  WBC 8.1 6.0 7.2 7.3  HGB 9.5* 10.4* 9.8* 9.8*  HCT 30.1* 32.3* 30.8* 32.0*  MCV 94.1 90.7 91.4 94.4  PLT 195 260 251 268   Basic Metabolic Panel: Recent Labs  Lab 09/06/19 0816 09/07/19 0550 09/07/19 1634 09/08/19 0446 09/09/19 0419  NA 135 136 136 139 139  K 2.8* 3.6 3.4* 3.2* 3.8  CL 100 102 102 103 101  CO2 22 22 22 26 27   GLUCOSE 226* 241* 237* 98 120*  BUN 58* 43* 35* 31* 32*  CREATININE 2.88* 1.82* 1.50* 1.33* 1.38*  CALCIUM 8.1* 8.2* 7.9* 8.5* 8.5*  PHOS 2.9  --   --   --   --    GFR: Estimated Creatinine Clearance: 22.7 mL/min (A) (by C-G formula based on SCr of 1.38 mg/dL (H)). Liver Function Tests: Recent Labs  Lab 09/06/19 0816  ALBUMIN 2.9*   No results for input(s): LIPASE, AMYLASE in the last 168 hours. No results for input(s): AMMONIA in the last 168 hours. Coagulation Profile: No results for input(s): INR, PROTIME in the last 168 hours. Cardiac Enzymes: No results for input(s): CKTOTAL, CKMB,  CKMBINDEX, TROPONINI in the last 168 hours. BNP (last 3 results) No results for input(s): PROBNP in the last 8760 hours. HbA1C: No results for input(s): HGBA1C in the last 72 hours. CBG: Recent Labs  Lab 09/09/19 0615 09/09/19 1103 09/09/19 1629 09/09/19 2128 09/10/19 0609  GLUCAP 127* 230* 179* 138* 100*   Lipid Profile: No results for input(s): CHOL, HDL, LDLCALC, TRIG, CHOLHDL, LDLDIRECT in the last 72 hours. Thyroid Function Tests: No results for input(s): TSH, T4TOTAL, FREET4, T3FREE, THYROIDAB in the last 72 hours. Anemia Panel: No results for input(s): VITAMINB12, FOLATE, FERRITIN, TIBC, IRON, RETICCTPCT in the last 72 hours. Sepsis Labs: No results for input(s): PROCALCITON, LATICACIDVEN in the last 168 hours.  Recent Results (from the past 240 hour(s))  Urine Culture     Status: Abnormal   Collection Time: 09/05/19 11:40 AM   Specimen: Urine, Random  Result Value Ref Range Status   Specimen Description URINE, RANDOM  Final   Special Requests  Final    NONE Performed at [facility], [place] [address], [place], [place] [address]    Culture >=100,000 COLONIES/mL ESCHERICHIA COLI (A)  Final   Report Status 09/07/2019 FINAL  Final   Organism ID, Bacteria ESCHERICHIA COLI (A)  Final      Susceptibility   Escherichia coli - MIC*    AMPICILLIN 4 SENSITIVE Sensitive     CEFAZOLIN <=4 SENSITIVE Sensitive     CEFTRIAXONE <=1 SENSITIVE Sensitive     CIPROFLOXACIN <=0.25 SENSITIVE Sensitive     GENTAMICIN <=1 SENSITIVE Sensitive     IMIPENEM <=0.25 SENSITIVE Sensitive     NITROFURANTOIN <=16 SENSITIVE Sensitive     TRIMETH/SULFA <=20 SENSITIVE Sensitive     AMPICILLIN/SULBACTAM <=2 SENSITIVE Sensitive     PIP/TAZO <=4 SENSITIVE Sensitive     * >=100,000 COLONIES/mL ESCHERICHIA COLI         Radiology Studies: No results found.      Scheduled Meds: . aspirin  81 mg Oral Daily  . cephALEXin  250 mg Oral Q8H  . furosemide  60 mg Intravenous BID  .  heparin injection (subcutaneous)  5,000 Units Subcutaneous Q8H  . insulin aspart  0-5 Units Subcutaneous QHS  . insulin aspart  0-6 Units Subcutaneous TID WC  . insulin aspart  4 Units Subcutaneous TID WC  . insulin glargine  15 Units Subcutaneous Q1400  . levothyroxine  50 mcg Oral QAC breakfast  . senna-docusate  1 tablet Oral BID  . simvastatin  20 mg Oral q1800  . sodium chloride flush  3 mL Intravenous Q12H   Continuous Infusions: . sodium chloride 250 mL (09/08/19 0852)     LOS: 12 days    Time spent: 25 minutes  Domenic Polite, MD [facility]   09/10/2019, 11:14 AM

## 2019-09-11 LAB — CBC
HCT: 32 % — ABNORMAL LOW (ref 36.0–46.0)
Hemoglobin: 10 g/dL — ABNORMAL LOW (ref 12.0–15.0)
MCH: 28.8 pg (ref 26.0–34.0)
MCHC: 31.3 g/dL (ref 30.0–36.0)
MCV: 92.2 fL (ref 80.0–100.0)
Platelets: 303 10*3/uL (ref 150–400)
RBC: 3.47 MIL/uL — ABNORMAL LOW (ref 3.87–5.11)
RDW: 15.2 % (ref 11.5–15.5)
WBC: 5.9 10*3/uL (ref 4.0–10.5)
nRBC: 0 % (ref 0.0–0.2)

## 2019-09-11 LAB — BASIC METABOLIC PANEL
Anion gap: 8 (ref 5–15)
BUN: 34 mg/dL — ABNORMAL HIGH (ref 8–23)
CO2: 26 mmol/L (ref 22–32)
Calcium: 8.2 mg/dL — ABNORMAL LOW (ref 8.9–10.3)
Chloride: 102 mmol/L (ref 98–111)
Creatinine, Ser: 1.02 mg/dL — ABNORMAL HIGH (ref 0.44–1.00)
GFR calc Af Amer: 57 mL/min — ABNORMAL LOW (ref >60)
GFR calc non Af Amer: 49 mL/min — ABNORMAL LOW (ref >60)
Glucose, Bld: 77 mg/dL (ref 70–99)
Potassium: 4.1 mmol/L (ref 3.5–5.1)
Sodium: 136 mmol/L (ref 135–145)

## 2019-09-11 LAB — GLUCOSE, CAPILLARY
Glucose-Capillary: 120 mg/dL — ABNORMAL HIGH (ref 70–99)
Glucose-Capillary: 160 mg/dL — ABNORMAL HIGH (ref 70–99)
Glucose-Capillary: 232 mg/dL — ABNORMAL HIGH (ref 70–99)
Glucose-Capillary: 259 mg/dL — ABNORMAL HIGH (ref 70–99)
Glucose-Capillary: 80 mg/dL (ref 70–99)

## 2019-09-11 MED ORDER — INSULIN ASPART 100 UNIT/ML ~~LOC~~ SOLN
2.0000 [IU] | Freq: Three times a day (TID) | SUBCUTANEOUS | Status: DC
  Administered 2019-09-11 – 2019-09-12 (×5): 2 [IU] via SUBCUTANEOUS

## 2019-09-11 MED ORDER — MAGNESIUM SULFATE 2 GM/50ML IV SOLN
2.0000 g | Freq: Once | INTRAVENOUS | Status: AC
  Administered 2019-09-11: 2 g via INTRAVENOUS
  Filled 2019-09-11: qty 50

## 2019-09-11 MED ORDER — INSULIN GLARGINE 100 UNIT/ML ~~LOC~~ SOLN
10.0000 [IU] | Freq: Every day | SUBCUTANEOUS | Status: DC
  Administered 2019-09-11 – 2019-09-12 (×2): 10 [IU] via SUBCUTANEOUS
  Filled 2019-09-11 (×3): qty 0.1

## 2019-09-11 MED ORDER — FUROSEMIDE 10 MG/ML IJ SOLN
60.0000 mg | Freq: Two times a day (BID) | INTRAMUSCULAR | Status: AC
  Administered 2019-09-11 (×2): 60 mg via INTRAVENOUS
  Filled 2019-09-11 (×2): qty 6

## 2019-09-11 NOTE — Progress Notes (Signed)
No bipap needed at this time. Patient has no c/o being short of breath with Sp02=100% on 2lpm.

## 2019-09-11 NOTE — Plan of Care (Signed)

## 2019-09-11 NOTE — Progress Notes (Signed)
PROGRESS NOTE    Nancy Mcguire  NKN:[id number] DOB: [birth year] DOA: 09/26/19 PCP: Robyne Peers, MD   Brief Narrative: 86 year old with past medical history significant for diastolic heart failure not on diuretics, moderate to severe pulmonary hypertension, severe aortic stenosis, heart murmur, CAD, carotid artery stenosis, CKD stage III AAA, diabetes type 2 on insulin pump, hypothyroidism, chronic back pain who presented with worsening shortness of breath and dry cough. Patient was admitted with acute on chronic diastolic heart failure exacerbation, chest x-ray showed cardiomegaly and small bilateral pleural effusion, CTA negative for PE, BNP 1024. Acute heart failure exacerbation was treated with IV Laxis, subsequently she was transitioned to oral Lasix.  She underwent evaluation for aortic valve replacement (TAVR), cardiac cath and CT chest with contrast.  Hospital course complicated by acute renal failure secondary to contrast nephropathy.  -then had recurrent flash pulmonary edema requiring BiPAP off and on for several days in a row, now plan for TAVR has been preponed    Assessment & Plan:   Acute on chronic diastolic heart failure  Acute on chronic hypoxic respiratory failure Severe pulmonary hypertension Severe aortic stenosis Recurrent flash pulmonary edema Echo with ejection fraction 60 to 65%, severe aortic valve stenosis -Diuresed with IV Lasix and then diuretics were held in the setting of AKI -Recurrent flash pulmonary edema requiring BiPAP two nights in a row -Cardiology following, plan to keep inpatient until TAVR early next week -Still dyspneic but overall improving, -3.8 L -Wean O2 as tolerated -nifedipine stopped with soft/low blood pressures to avoid hypotension -?  Restart low-dose beta-blocker  Aortic Valve Severe Stenosis:  CAD/CABG  cardiology consulting completed pre-TAVR work-up Had Cath: LIMA and Circumflex graft open, diagonal graft with obstruction.    Underwent  CT scans, CVTS sx consulted , TAVR preproned to early next week  Acute on CKD stage III a; Contrast nephropathy -Creatinine peaked at 4.9 -Now improving -Was briefly hydrated with IVs fluids as well, renal ultrasound without hydronephrosis -Appreciate nephrology input -Kidney function improving, diuresed with IV Lasix -Creatinine is stable  E. coli UTI -Completed 3 days of IV ceftriaxone followed by 2 days of Keflex, off antibiotics now  Controlled diabetes type 1 with hyperglycemia: -Hypoglycemic overnight,  decrease Lantus and meal coverage, resume insulin pump at discharge  Essential hypertension: Losartan, metoprolol and nifedipine on hold  Hypothyroidism continue with Synthroid.  Hypomagnesemia: Replete.  Estimated body mass index is 20.17 kg/m as calculated from the following:   Height as of this encounter: 5\' 2"  (1.575 m).   Weight as of this encounter: 50 kg.   DVT prophylaxis: Lovenox Code Status: DNR DNI Family Communication: No family at bedside, updated daughter 5/27 and 528 Disposition Plan:  Status is: Inpatient Recurrent flash pulmonary edema in the setting of CAD and severe AS improving acute kidney injury, plan for DC after TAVR on Tuesday  Dispo: The patient is from: Home              Anticipated d/c is to: Home               Anticipated d/c date is: 4 to 5 days recurrent pulmonary edema requiring BiPAP, plan for TAVR early next week              Patient currently; not medical stable for discharge   Consultants:   Cardiology  Procedures:   Echo  Antimicrobials:  None  Subjective: Breathing overall improving, developed symptomatic hypoglycemia overnight  Objective: Vitals:   09/11/19 0049 09/11/19  4580 09/11/19 0829 09/11/19 1119  BP: 128/87 119/79    Pulse: 98 96    Resp: (!) 22 18    Temp: 98.2 F (36.8 C) 98.3 F (36.8 C) 98 F (36.7 C) 97.9 F (36.6 C)  TempSrc: Oral Oral Oral Oral  SpO2: 100% 98%    Weight: 50 kg      Height:        Intake/Output Summary (Last 24 hours) at 09/11/2019 1156 Last data filed at 09/11/2019 0849 Gross per 24 hour  Intake 480 ml  Output 1100 ml  Net -620 ml   Filed Weights   09/09/19 0024 09/10/19 0503 09/11/19 0049  Weight: 51.3 kg 50.6 kg 50 kg    Examination:  General exam: Elderly chronically ill female sitting up in bed, AAOx3, no distress HEENT: Positive JVD CVS S1-S2 regular rhythm, loud systolic ejection murmur Lungs with bilateral lower lobe rales Abdomen is soft nontender with positive bowel sounds Extremities: No edema  Neuro moves all extremities, no localizing signs  Psych: Appropriate mood and affect  Data Reviewed: I have personally reviewed following labs and imaging studies  CBC: Recent Labs  Lab 09/06/19 0816 09/08/19 0446 09/09/19 0419 09/11/19 0650  WBC 6.0 7.2 7.3 5.9  HGB 10.4* 9.8* 9.8* 10.0*  HCT 32.3* 30.8* 32.0* 32.0*  MCV 90.7 91.4 94.4 92.2  PLT 260 251 268 303   Basic Metabolic Panel: Recent Labs  Lab 09/06/19 0816 09/06/19 0816 09/07/19 0550 09/07/19 1634 09/08/19 0446 09/09/19 0419 09/11/19 0650  NA 135   < > 136 136 139 139 136  K 2.8*   < > 3.6 3.4* 3.2* 3.8 4.1  CL 100   < > 102 102 103 101 102  CO2 22   < > 22 22 26 27 26   GLUCOSE 226*   < > 241* 237* 98 120* 77  BUN 58*   < > 43* 35* 31* 32* 34*  CREATININE 2.88*   < > 1.82* 1.50* 1.33* 1.38* 1.02*  CALCIUM 8.1*   < > 8.2* 7.9* 8.5* 8.5* 8.2*  PHOS 2.9  --   --   --   --   --   --    < > = values in this interval not displayed.   GFR: Estimated Creatinine Clearance: 30.7 mL/min (A) (by C-G formula based on SCr of 1.02 mg/dL (H)). Liver Function Tests: Recent Labs  Lab 09/06/19 0816  ALBUMIN 2.9*   No results for input(s): LIPASE, AMYLASE in the last 168 hours. No results for input(s): AMMONIA in the last 168 hours. Coagulation Profile: No results for input(s): INR, PROTIME in the last 168 hours. Cardiac Enzymes: No results for input(s):  CKTOTAL, CKMB, CKMBINDEX, TROPONINI in the last 168 hours. BNP (last 3 results) No results for input(s): PROBNP in the last 8760 hours. HbA1C: No results for input(s): HGBA1C in the last 72 hours. CBG: Recent Labs  Lab 09/10/19 1613 09/10/19 2232 09/11/19 0053 09/11/19 0632 09/11/19 1119  GLUCAP 213* 135* 160* 80 232*   Lipid Profile: No results for input(s): CHOL, HDL, LDLCALC, TRIG, CHOLHDL, LDLDIRECT in the last 72 hours. Thyroid Function Tests: No results for input(s): TSH, T4TOTAL, FREET4, T3FREE, THYROIDAB in the last 72 hours. Anemia Panel: No results for input(s): VITAMINB12, FOLATE, FERRITIN, TIBC, IRON, RETICCTPCT in the last 72 hours. Sepsis Labs: No results for input(s): PROCALCITON, LATICACIDVEN in the last 168 hours.  Recent Results (from the past 240 hour(s))  Urine Culture     Status:  Abnormal   Collection Time: 09/05/19 11:40 AM   Specimen: Urine, Random  Result Value Ref Range Status   Specimen Description URINE, RANDOM  Final   Special Requests   Final    NONE Performed at [facility], [address] [place], [place] [address]    Culture >=100,000 COLONIES/mL ESCHERICHIA COLI (A)  Final   Report Status 09/07/2019 FINAL  Final   Organism ID, Bacteria ESCHERICHIA COLI (A)  Final      Susceptibility   Escherichia coli - MIC*    AMPICILLIN 4 SENSITIVE Sensitive     CEFAZOLIN <=4 SENSITIVE Sensitive     CEFTRIAXONE <=1 SENSITIVE Sensitive     CIPROFLOXACIN <=0.25 SENSITIVE Sensitive     GENTAMICIN <=1 SENSITIVE Sensitive     IMIPENEM <=0.25 SENSITIVE Sensitive     NITROFURANTOIN <=16 SENSITIVE Sensitive     TRIMETH/SULFA <=20 SENSITIVE Sensitive     AMPICILLIN/SULBACTAM <=2 SENSITIVE Sensitive     PIP/TAZO <=4 SENSITIVE Sensitive     * >=100,000 COLONIES/mL ESCHERICHIA COLI         Radiology Studies: No results found.      Scheduled Meds: . aspirin  81 mg Oral Daily  . furosemide  60 mg Intravenous BID  . heparin injection  (subcutaneous)  5,000 Units Subcutaneous Q8H  . insulin aspart  0-5 Units Subcutaneous QHS  . insulin aspart  0-6 Units Subcutaneous TID WC  . insulin glargine  10 Units Subcutaneous Daily  . levothyroxine  50 mcg Oral QAC breakfast  . [START ON 2019/09/23] magnesium sulfate  40 mEq Other To OR  . [START ON 09/23/2019] potassium chloride  80 mEq Other To OR  . potassium chloride  40 mEq Oral BID  . senna-docusate  1 tablet Oral BID  . simvastatin  20 mg Oral q1800  . sodium chloride flush  3 mL Intravenous Q12H   Continuous Infusions: . sodium chloride 250 mL (09/08/19 0852)  . [START ON 2019/09/23] cefUROXime (ZINACEF)  IV    . [START ON 23-Sep-2019] dexmedetomidine    . [START ON Sep 23, 2019] heparin 30,000 units/NS 1000 mL solution for CELLSAVER    . [START ON Sep 23, 2019] norepinephrine (LEVOPHED) Adult infusion    . [START ON 2019-09-23] vancomycin       LOS: 13 days    Time spent: 25 minutes  Zannie Cove, MD [facility]   09/11/2019, 11:56 AM

## 2019-09-11 NOTE — Progress Notes (Signed)
Cardiology Progress Note  Patient ID: Nancy Mcguire MRN: [id number] DOB: 17-Apr-1929 Date of Encounter: 09/11/2019  Primary Cardiologist: Nanetta Batty, MD  Subjective   Chief Complaint: SOB  HPI: Still SOB. Still with volume overload.   ROS:  All other ROS reviewed and negative. Pertinent positives noted in the HPI.     Inpatient Medications  Scheduled Meds: . aspirin  81 mg Oral Daily  . heparin injection (subcutaneous)  5,000 Units Subcutaneous Q8H  . insulin aspart  0-5 Units Subcutaneous QHS  . insulin aspart  0-6 Units Subcutaneous TID WC  . insulin glargine  10 Units Subcutaneous Daily  . levothyroxine  50 mcg Oral QAC breakfast  . [START ON 09/20/2019] magnesium sulfate  40 mEq Other To OR  . [START ON 10/11/2019] potassium chloride  80 mEq Other To OR  . potassium chloride  40 mEq Oral BID  . senna-docusate  1 tablet Oral BID  . simvastatin  20 mg Oral q1800  . sodium chloride flush  3 mL Intravenous Q12H   Continuous Infusions: . sodium chloride 250 mL (09/08/19 0852)  . [START ON 09/17/2019] cefUROXime (ZINACEF)  IV    . [START ON 10/03/2019] dexmedetomidine    . [START ON 10/10/2019] heparin 30,000 units/NS 1000 mL solution for CELLSAVER    . [START ON 09/28/2019] norepinephrine (LEVOPHED) Adult infusion    . [START ON 09/25/2019] vancomycin     PRN Meds: sodium chloride, acetaminophen, lactulose, ondansetron (ZOFRAN) IV, sodium chloride flush   Vital Signs   Vitals:   09/10/19 1950 09/11/19 0049 09/11/19 0513 09/11/19 0829  BP: 112/78 128/87 119/79   Pulse: (!) 106 98 96   Resp: 18 (!) 22 18   Temp: 97.9 F (36.6 C) 98.2 F (36.8 C) 98.3 F (36.8 C) 98 F (36.7 C)  TempSrc: Oral Oral Oral Oral  SpO2: 100% 100% 98%   Weight:  50 kg    Height:        Intake/Output Summary (Last 24 hours) at 09/11/2019 1002 Last data filed at 09/11/2019 0849 Gross per 24 hour  Intake 480 ml  Output 1650 ml  Net -1170 ml   Last 3 Weights 09/11/2019 09/10/2019 09/09/2019   Weight (lbs) 110 lb 4.8 oz 111 lb 8.8 oz 113 lb 1.5 oz  Weight (kg) 50.032 kg 50.6 kg 51.3 kg      Telemetry  Overnight telemetry shows SR 90s, which I personally reviewed.   Physical Exam   Vitals:   09/10/19 1950 09/11/19 0049 09/11/19 0513 09/11/19 0829  BP: 112/78 128/87 119/79   Pulse: (!) 106 98 96   Resp: 18 (!) 22 18   Temp: 97.9 F (36.6 C) 98.2 F (36.8 C) 98.3 F (36.8 C) 98 F (36.7 C)  TempSrc: Oral Oral Oral Oral  SpO2: 100% 100% 98%   Weight:  50 kg    Height:         Intake/Output Summary (Last 24 hours) at 09/11/2019 1002 Last data filed at 09/11/2019 0849 Gross per 24 hour  Intake 480 ml  Output 1650 ml  Net -1170 ml    Last 3 Weights 09/11/2019 09/10/2019 09/09/2019  Weight (lbs) 110 lb 4.8 oz 111 lb 8.8 oz 113 lb 1.5 oz  Weight (kg) 50.032 kg 50.6 kg 51.3 kg    Body mass index is 20.17 kg/m.   General: Well nourished, well developed, in no acute distress Head: Atraumatic, normal size  Eyes: PEERLA, EOMI  Neck: Supple, +JVD Endocrine: No thryomegaly Cardiac:  Normal S1, S2; 3/6 late peaking SEM Lungs: +rales  Abd: Soft, nontender, no hepatomegaly  Ext: No edema, pulses 2+ Musculoskeletal: No deformities, BUE and BLE strength normal and equal Skin: Warm and dry, no rashes   Neuro: Alert and oriented to person, place, time, and situation, CNII-XII grossly intact, no focal deficits  Psych: Normal mood and affect   Labs  High Sensitivity Troponin:   Recent Labs  Lab 09/09/2019 1220 08/13/2019 1539  TROPONINIHS 21* 26*     Cardiac EnzymesNo results for input(s): TROPONINI in the last 168 hours. No results for input(s): TROPIPOC in the last 168 hours.  Chemistry Recent Labs  Lab 09/06/19 0816 09/07/19 0550 09/08/19 0446 09/09/19 0419 09/11/19 0650  NA 135   < > 139 139 136  K 2.8*   < > 3.2* 3.8 4.1  CL 100   < > 103 101 102  CO2 22   < > 26 27 26   GLUCOSE 226*   < > 98 120* 77  BUN 58*   < > 31* 32* 34*  CREATININE 2.88*   < > 1.33*  1.38* 1.02*  CALCIUM 8.1*   < > 8.5* 8.5* 8.2*  ALBUMIN 2.9*  --   --   --   --   GFRNONAA 14*   < > 36* 34* 49*  GFRAA 16*   < > 42* 40* 57*  ANIONGAP 13   < > 10 11 8    < > = values in this interval not displayed.    Hematology Recent Labs  Lab 09/08/19 0446 09/09/19 0419 09/11/19 0650  WBC 7.2 7.3 5.9  RBC 3.37* 3.39* 3.47*  HGB 9.8* 9.8* 10.0*  HCT 30.8* 32.0* 32.0*  MCV 91.4 94.4 92.2  MCH 29.1 28.9 28.8  MCHC 31.8 30.6 31.3  RDW 15.3 15.4 15.2  PLT 251 268 303   BNPNo results for input(s): BNP, PROBNP in the last 168 hours.  DDimer No results for input(s): DDIMER in the last 168 hours.   Radiology  No results found.  Cardiac Studies  TTE 08/30/2019 1. Left ventricular ejection fraction, by estimation, is 60 to 65%. The  left ventricle has normal function. The left ventricle has no regional  wall motion abnormalities. There is mild left ventricular hypertrophy.  Left ventricular diastolic parameters  are consistent with Grade II diastolic dysfunction (pseudonormalization).  Elevated left ventricular end-diastolic pressure.  2. Right ventricular systolic function is mildly reduced. The right  ventricular size is normal. There is severely elevated pulmonary artery  systolic pressure. The estimated right ventricular systolic pressure is  67.1 mmHg.  3. Left atrial size was mildly dilated.  4. Right atrial size was mildly dilated.  5. The mitral valve is abnormal. Mild to moderate mitral valve  regurgitation.  6. Tricuspid valve regurgitation is mild to moderate.  7. The aortic valve is tricuspid. Aortic valve regurgitation is trivial.  Severe aortic valve stenosis. Aortic valve area, by VTI measures 0.36 cm.  Aortic valve mean gradient measures 51.0 mmHg. Aortic valve Vmax measures  4.68 m/s.  8. The inferior vena cava is dilated in size with <50% respiratory  variability, suggesting right atrial pressure of 15 mmHg.   Patient Profile  [name] is  a 86 y.o. female with CAD s/p CABG, DM, HTN, severe AS admitted 09/06/2019 for acute pulmonary edema 2/2 severe AS.   Assessment & Plan   1. Severe AS/Hypoxic Respiratory Failure/Pulmonary Edema -1.3 L UOP with lasix. Net negative 3.6 L since  admit. O2 requirement improving. Still volume up.  -continue diuresis gently today; 60 mg IV lasix BID today. Will assess need tomorrow (not standing dose).  -replace K and Mg -O2 requirement coming down with diuresis -will continue to optimize for TAVR Tuesday  2. AKI -improving with diuresis  3. CAD s/p CABG -ASA/statin   For questions or updates, please contact [facility] Please consult [email or web address] for contact info under   Time Spent with Patient: I have spent a total of 25 minutes with patient reviewing hospital notes, telemetry, EKGs, labs and examining the patient as well as establishing an assessment and plan that was discussed with the patient.  > 50% of time was spent in direct patient care.    Signed, Lenna Gilford. Flora Lipps, MD [place]  [facility]  09/11/2019 10:02 AM

## 2019-09-12 DIAGNOSIS — R011 Cardiac murmur, unspecified: Secondary | ICD-10-CM

## 2019-09-12 LAB — BASIC METABOLIC PANEL
Anion gap: 14 (ref 5–15)
BUN: 35 mg/dL — ABNORMAL HIGH (ref 8–23)
CO2: 23 mmol/L (ref 22–32)
Calcium: 8.4 mg/dL — ABNORMAL LOW (ref 8.9–10.3)
Chloride: 101 mmol/L (ref 98–111)
Creatinine, Ser: 1.2 mg/dL — ABNORMAL HIGH (ref 0.44–1.00)
GFR calc Af Amer: 47 mL/min — ABNORMAL LOW (ref >60)
GFR calc non Af Amer: 41 mL/min — ABNORMAL LOW (ref >60)
Glucose, Bld: 135 mg/dL — ABNORMAL HIGH (ref 70–99)
Potassium: 5.1 mmol/L (ref 3.5–5.1)
Sodium: 138 mmol/L (ref 135–145)

## 2019-09-12 LAB — TYPE AND SCREEN
ABO/RH(D): O NEG
Antibody Screen: NEGATIVE

## 2019-09-12 LAB — PROTIME-INR
INR: 1 (ref 0.8–1.2)
Prothrombin Time: 13.1 seconds (ref 11.4–15.2)

## 2019-09-12 LAB — URINALYSIS, ROUTINE W REFLEX MICROSCOPIC
Bilirubin Urine: NEGATIVE
Glucose, UA: 50 mg/dL — AB
Hgb urine dipstick: NEGATIVE
Ketones, ur: NEGATIVE mg/dL
Leukocytes,Ua: NEGATIVE
Nitrite: NEGATIVE
Protein, ur: NEGATIVE mg/dL
Specific Gravity, Urine: 1.019 (ref 1.005–1.030)
pH: 5 (ref 5.0–8.0)

## 2019-09-12 LAB — GLUCOSE, CAPILLARY
Glucose-Capillary: 166 mg/dL — ABNORMAL HIGH (ref 70–99)
Glucose-Capillary: 253 mg/dL — ABNORMAL HIGH (ref 70–99)
Glucose-Capillary: 269 mg/dL — ABNORMAL HIGH (ref 70–99)
Glucose-Capillary: 218 mg/dL — ABNORMAL HIGH (ref 70–99)

## 2019-09-12 LAB — MAGNESIUM: Magnesium: 2.1 mg/dL (ref 1.7–2.4)

## 2019-09-12 LAB — ABO/RH: ABO/RH(D): O NEG

## 2019-09-12 MED ORDER — TEMAZEPAM 7.5 MG PO CAPS
15.0000 mg | ORAL_CAPSULE | Freq: Once | ORAL | Status: DC | PRN
  Filled 2019-09-12: qty 2

## 2019-09-12 MED ORDER — FUROSEMIDE 10 MG/ML IJ SOLN
40.0000 mg | Freq: Two times a day (BID) | INTRAMUSCULAR | Status: DC
  Administered 2019-09-12 (×2): 40 mg via INTRAVENOUS
  Filled 2019-09-12 (×2): qty 4

## 2019-09-12 MED ORDER — BISACODYL 5 MG PO TBEC
5.0000 mg | DELAYED_RELEASE_TABLET | Freq: Once | ORAL | Status: DC
  Filled 2019-09-12: qty 1

## 2019-09-12 MED ORDER — CHLORHEXIDINE GLUCONATE 0.12 % MT SOLN
15.0000 mL | Freq: Once | OROMUCOSAL | Status: AC
  Administered 2019-09-13: 15 mL via OROMUCOSAL
  Filled 2019-09-12: qty 15

## 2019-09-12 MED ORDER — CHLORHEXIDINE GLUCONATE 4 % EX LIQD
1.0000 "application " | Freq: Once | CUTANEOUS | Status: AC
  Administered 2019-09-12: 1 via TOPICAL
  Filled 2019-09-12: qty 15

## 2019-09-12 NOTE — Progress Notes (Signed)
PT Cancellation Note  Patient Details Name: Nancy Mcguire MRN: [id number] DOB: [birth year]   Cancelled Treatment:    Reason Eval/Treat Not Completed: Other (comment) Pt just getting back to bed and requesting to hold on PT until later in afternoon. Will check back as schedule allows.   Farley Ly, PT, DPT  Acute Rehabilitation Services  Pager: [phone] Office: [phone]    Lehman Prom 09/12/2019, 9:25 AM

## 2019-09-12 NOTE — Progress Notes (Signed)
Physical Therapy Treatment Patient Details Name: Nancy Mcguire MRN: [id number] DOB: [birth year] Today's Date: 84/31/2021    History of Present Illness Pt is an 86 y/o female admitted secondary to CHF exacerbation. Was also found to have severe aortic stenosis and is being assessed for TAVR. Pt for heart cath on 09/04/2019. PMH includes HTN, CAD s/p CABG, CHF, and DM.  Rapid Response called for difficulty breathing, CXR revealed pulmonary edema and pt placed on BiPAP 5/26. Pt scheduled for TAVR on October 03, 2019.     PT Comments    Pt progressing towards goals, however, remains limited secondary to fatigue. Only able to tolerate short distance ambulation this session with min guard and RW. Oxygen sats at >95% on 2L. Current recommendations appropriate. TAVR planned for 6/1, and will need to reassess following surgery. Will continue to follow acutely to maximize functional mobility independence and safety.      Follow Up Recommendations  Home health PT;Supervision for mobility/OOB     Equipment Recommendations  None recommended by PT    Recommendations for Other Services       Precautions / Restrictions Precautions Precautions: Fall Restrictions Weight Bearing Restrictions: No    Mobility  Bed Mobility               General bed mobility comments: In chair upon entry   Transfers Overall transfer level: Needs assistance Equipment used: Rolling walker (2 wheeled) Transfers: Sit to/from Stand Sit to Stand: Min guard         General transfer comment: Min guard for steadying assist. Increased time required.   Ambulation/Gait Ambulation/Gait assistance: Min guard;Min assist Gait Distance (Feet): 20 Feet Assistive device: Rolling walker (2 wheeled) Gait Pattern/deviations: Step-through pattern;Decreased stride length;Trunk flexed;Drifts right/left Gait velocity: Decreased   General Gait Details: Slow, guarded gait. Distance limited secondary to fatigue. Required standing rest  X2 this session. Oxygen sats >95% on 2L throughout gait. Further distance limited secondary to fatigue   Stairs             Wheelchair Mobility    Modified Rankin (Stroke Patients Only)       Balance Overall balance assessment: Needs assistance Sitting-balance support: No upper extremity supported;Feet supported Sitting balance-Leahy Scale: Good     Standing balance support: Bilateral upper extremity supported;During functional activity Standing balance-Leahy Scale: Poor Standing balance comment: Reliant on BUE support                             Cognition Arousal/Alertness: Awake/alert Behavior During Therapy: WFL for tasks assessed/performed Overall Cognitive Status: Within Functional Limits for tasks assessed                                        Exercises      General Comments General comments (skin integrity, edema, etc.): Pt's daughter present during session.       Pertinent Vitals/Pain Pain Assessment: No/denies pain    Home Living                      Prior Function            PT Goals (current goals can now be found in the care plan section) Acute Rehab PT Goals Patient Stated Goal: to go home PT Goal Formulation: With patient Time For Goal Achievement: 09/15/19 Potential to Achieve Goals: Good Progress  towards PT goals: Progressing toward goals    Frequency    Min 3X/week      PT Plan Current plan remains appropriate    Co-evaluation              AM-PAC PT "6 Clicks" Mobility   Outcome Measure  Help needed turning from your back to your side while in a flat bed without using bedrails?: None Help needed moving from lying on your back to sitting on the side of a flat bed without using bedrails?: None Help needed moving to and from a bed to a chair (including a wheelchair)?: A Little Help needed standing up from a chair using your arms (e.g., wheelchair or bedside chair)?: A Little Help  needed to walk in hospital room?: A Little Help needed climbing 3-5 steps with a railing? : A Lot 6 Click Score: 19    End of Session Equipment Utilized During Treatment: Oxygen Activity Tolerance: Patient limited by fatigue Patient left: in chair;with call bell/phone within reach;with family/visitor present Nurse Communication: Mobility status PT Visit Diagnosis: Muscle weakness (generalized) (M62.81)     Time: 2637-8588 PT Time Calculation (min) (ACUTE ONLY): 14 min  Charges:  $Gait Training: 8-22 mins                     Lou Miner, DPT  Acute Rehabilitation Services  Pager: [phone] Office: [phone]    Rudean Hitt 09/12/2019, 4:40 PM

## 2019-09-12 NOTE — Progress Notes (Signed)
Progress Note  Patient Name: Nancy Mcguire Date of Encounter: 09/12/2019  Primary Cardiologist: Quay Burow, MD   Subjective   Nancy Mcguire is feeling well this morning.  She denies shortness of breath.  She is on 2 L O2 by nasal prong.  She has been getting out of bed and ambulating in her room with minimal difficulty.  Inpatient Medications    Scheduled Meds: . aspirin  81 mg Oral Daily  . heparin injection (subcutaneous)  5,000 Units Subcutaneous Q8H  . insulin aspart  0-5 Units Subcutaneous QHS  . insulin aspart  0-6 Units Subcutaneous TID WC  . insulin aspart  2 Units Subcutaneous TID WC  . insulin glargine  10 Units Subcutaneous Daily  . levothyroxine  50 mcg Oral QAC breakfast  . [START ON 09/25/2019] magnesium sulfate  40 mEq Other To OR  . [START ON 10/10/2019] potassium chloride  80 mEq Other To OR  . senna-docusate  1 tablet Oral BID  . simvastatin  20 mg Oral q1800  . sodium chloride flush  3 mL Intravenous Q12H   Continuous Infusions: . sodium chloride 250 mL (09/08/19 0852)  . [START ON 09/27/2019] cefUROXime (ZINACEF)  IV    . [START ON 10/01/2019] dexmedetomidine    . [START ON 10/08/2019] heparin 30,000 units/NS 1000 mL solution for CELLSAVER    . [START ON 09/17/2019] norepinephrine (LEVOPHED) Adult infusion    . [START ON 10/06/2019] vancomycin     PRN Meds: sodium chloride, acetaminophen, lactulose, ondansetron (ZOFRAN) IV, sodium chloride flush   Vital Signs    Vitals:   09/11/19 1119 09/11/19 1608 09/11/19 2210 09/12/19 0334  BP:  117/66 117/86 115/63  Pulse:  88 88 96  Resp:  18 18 18   Temp: 97.9 F (36.6 C) 97.9 F (36.6 C) 97.6 F (36.4 C) 97.8 F (36.6 C)  TempSrc: Oral Oral Oral Oral  SpO2:  98% 100% 91%  Weight:    49.7 kg  Height:        Intake/Output Summary (Last 24 hours) at 09/12/2019 0733 Last data filed at 09/12/2019 0334 Gross per 24 hour  Intake 720 ml  Output 2000 ml  Net -1280 ml   Last 3 Weights 09/12/2019 09/11/2019 09/10/2019   Weight (lbs) 109 lb 8 oz 110 lb 4.8 oz 111 lb 8.8 oz  Weight (kg) 49.669 kg 50.032 kg 50.6 kg      Telemetry    Sinus rhythm- Personally Reviewed  ECG    Not performed today- Personally Reviewed  Physical Exam   GEN: No acute distress.   Neck: No JVD Cardiac: RRR, with a 2/6 outflow tract murmur consistent with aortic stenosis Respiratory: Clear to auscultation bilaterally. GI: Soft, nontender, non-distended  MS: No edema; No deformity. Neuro:  Nonfocal  Psych: Normal affect   Labs    High Sensitivity Troponin:   Recent Labs  Lab 09/16/2019 1220 Sep 16, 2019 1539  TROPONINIHS 21* 26*      Chemistry Recent Labs  Lab 09/06/19 0816 09/07/19 0550 09/09/19 0419 09/11/19 0650 09/12/19 0430  NA 135   < > 139 136 138  K 2.8*   < > 3.8 4.1 5.1  CL 100   < > 101 102 101  CO2 22   < > 27 26 23   GLUCOSE 226*   < > 120* 77 135*  BUN 58*   < > 32* 34* 35*  CREATININE 2.88*   < > 1.38* 1.02* 1.20*  CALCIUM 8.1*   < >  8.5* 8.2* 8.4*  ALBUMIN 2.9*  --   --   --   --   GFRNONAA 14*   < > 34* 49* 41*  GFRAA 16*   < > 40* 57* 47*  ANIONGAP 13   < > 11 8 14    < > = values in this interval not displayed.     Hematology Recent Labs  Lab 09/08/19 0446 09/09/19 0419 09/11/19 0650  WBC 7.2 7.3 5.9  RBC 3.37* 3.39* 3.47*  HGB 9.8* 9.8* 10.0*  HCT 30.8* 32.0* 32.0*  MCV 91.4 94.4 92.2  MCH 29.1 28.9 28.8  MCHC 31.8 30.6 31.3  RDW 15.3 15.4 15.2  PLT 251 268 303    BNPNo results for input(s): BNP, PROBNP in the last 168 hours.   DDimer No results for input(s): DDIMER in the last 168 hours.   Radiology    No results found.  Cardiac Studies   Cardiac catheterization (2019-09-28)  Conclusion    Prox LAD to Mid LAD lesion is 90% stenosed.  Mid LAD-1 lesion is 90% stenosed.  Mid LAD-2 lesion is 90% stenosed.  Origin to Prox Graft lesion is 100% stenosed.  Ost Cx to Prox Cx lesion is 90% stenosed.  2nd Mrg lesion is 90% stenosed.  Mid Graft to Dist Graft  lesion is 60% stenosed.  Prox Graft to Mid Graft lesion is 40% stenosed.  Hemodynamic findings consistent with aortic valve stenosis.   Nancy Mcguire is a 86 y.o. female    97 LOCATION:  FACILITY: [facility]  PHYSICIAN: [id number], M.D. 12-11-1929   DATE OF PROCEDURE:  September 28, 2019  DATE OF DISCHARGE:     CARDIAC CATHETERIZATION     History obtained from chart review.86 y.o.femalewith a hx of HTN, CAD (CABG X3 26 yrs ago), DM-1(on insulin pump) and AS with echo 01/2019 with AVarea by VTI of 0.51who is being seen today for the evaluation of SOB and severe AS.   IMPRESSION: Nancy Mcguire has 2 of 3 grafts patent in the left dominant system.  The vein graft to the diagonal branch is occluded at the origin.  The LIMA to the LAD is patent with significant proximal LAD disease proximal to LIMA insertion.  The vein graft to the circumflex has moderate disease in the mid and distal shaft.  The RCA is nondominant.  She has severe aortic stenosis with a valve area between 0.6 1.8 by Fick or thermal dilution and EDP of 18.  I did shoot her distal abdominal aorta revealing widely patent iliacs arteries.  I reviewed the films with Dr. Ave Filter who feels that she is a acceptable candidate for TAVR.  I performed angiography of the right common femoral artery and and successfully deployed a MYNX closure device in the right common femoral artery and vein achieving hemostasis.  The patient left lab in stable condition.   Coronary Diagrams  Diagnostic Dominance: Left    2D echocardiogram (08/30/2019)  IMPRESSIONS    1. Left ventricular ejection fraction, by estimation, is 60 to 65%. The  left ventricle has normal function. The left ventricle has no regional  wall motion abnormalities. There is mild left ventricular hypertrophy.  Left ventricular diastolic parameters  are consistent with Grade II diastolic dysfunction (pseudonormalization).  Elevated left ventricular  end-diastolic pressure.  2. Right ventricular systolic function is mildly reduced. The right  ventricular size is normal. There is severely elevated pulmonary artery  systolic pressure. The estimated right ventricular systolic pressure is  82.2 mmHg.  3. Left  atrial size was mildly dilated.  4. Right atrial size was mildly dilated.  5. The mitral valve is abnormal. Mild to moderate mitral valve  regurgitation.  6. Tricuspid valve regurgitation is mild to moderate.  7. The aortic valve is tricuspid. Aortic valve regurgitation is trivial.  Severe aortic valve stenosis. Aortic valve area, by VTI measures 0.36 cm.  Aortic valve mean gradient measures 51.0 mmHg. Aortic valve Vmax measures  4.68 m/s.  8. The inferior vena cava is dilated in size with <50% respiratory  variability, suggesting right atrial pressure of 15 mmHg.   Comparison(s): Changes from prior study are noted. 01/13/2019: LVEF 60-65%,  severe AS - mean gradient 43 mmHg.  Patient Profile     Nancy Mcguire is a 86 y.o. female with CAD s/p CABG, DM, HTN, severe AS admitted 09/06/2019 for acute pulmonary edema 2/2 severe AS.   Assessment & Plan    1: Severe aortic stenosis/diastolic heart failure-Nancy Mcguire has diuresed over 5 L on 60 of Lasix IV twice daily.  Her O2 requirement is 2 L.  She is ambulating with minimal difficulty.  Continue Lasix at a slightly reduced dose of 40 mg IV twice daily.  Plan is for TAVR tomorrow.  2: Acute renal insufficiency-serum creatinine peaked in the 5 range from radiocontrast nephropathy and has dropped down to 1.2.  Up slightly from yesterday when it was 1.02.  We will decrease IV Lasix from 60 to 40 mg IV twice daily.  3: CAD status post CABG-status post CABG 26 years ago with cardiac cath performed by myself 08/31/2019 revealing an occluded vein to a diagonal branch, patent vein to the circumflex and LIMA to the LAD and a left dominant system.  Patient is otherwise stable denies chest  pain.       For questions or updates, please contact [facility] Please consult [email or web address] for contact info under        Signed, Nanetta Batty, MD  09/12/2019, 7:33 AM

## 2019-09-12 NOTE — Progress Notes (Signed)
  [facility]   MULTIDISCIPLINARY HEART VALVE TEAM  Patient is stable. Plan for TAVR tomorrow, first case. I have placed inpatient pre op orders. NPO after midnight.   Cline Crock PA-C  [facility]

## 2019-09-12 NOTE — Progress Notes (Signed)
PROGRESS NOTE    Nancy Mcguire  IRC:[id number] DOB: 06/28/1929 DOA: 08/24/2019 PCP: Angelica Chessman, MD   Brief Narrative: 86 year old with past medical history significant for diastolic heart failure not on diuretics, moderate to severe pulmonary hypertension, severe aortic stenosis, heart murmur, CAD, carotid artery stenosis, CKD stage III AAA, diabetes type 2 on insulin pump, hypothyroidism, chronic back pain who presented with worsening shortness of breath and dry cough. Patient was admitted with acute on chronic diastolic heart failure exacerbation, chest x-ray showed cardiomegaly and small bilateral pleural effusion, CTA negative for PE, BNP 1024. Acute heart failure exacerbation was treated with IV Laxis, subsequently she was transitioned to oral Lasix.  She underwent evaluation for aortic valve replacement (TAVR), cardiac cath and CT chest with contrast.  Hospital course complicated by acute renal failure secondary to contrast nephropathy.  -then had recurrent flash pulmonary edema requiring BiPAP off and on for several days in a row, now plan for TAVR has been preponed    Assessment & Plan:   Acute on chronic diastolic heart failure  Acute on chronic hypoxic respiratory failure Severe pulmonary hypertension Severe aortic stenosis Recurrent flash pulmonary edema Echo with ejection fraction 60 to 65%, severe aortic valve stenosis -Diuresed with IV Lasix and then diuretics were held in the setting of AKI -Recurrent flash pulmonary edema requiring BiPAP two nights in a row -Cardiology following, plan to keep inpatient until TAVR early next week -overall improving, 5.1 L negative, remains on IV Lasix -Wean O2 as tolerated -Antihypertensives held given soft blood pressures limiting diuresis -?  Restart beta-blocker  Aortic Valve Severe Stenosis:  CAD/CABG  cardiology consulting completed pre-TAVR work-up Had Cath: LIMA and Circumflex graft open, diagonal graft with obstruction.    Underwent  CT scans, CVTS sx consulted , TAVR Tuesday 6/1  Acute on CKD stage III a; Contrast nephropathy -Creatinine peaked at 4.9 -Now improving -Was briefly hydrated with IVs fluids as well, renal ultrasound without hydronephrosis -Appreciate nephrology input -Kidney function improving, diuresed with IV Lasix -Creatinine is stable  E. coli UTI -Completed 3 days of IV ceftriaxone followed by 2 days of Keflex, off antibiotics now  Controlled diabetes type 1 with hyperglycemia: -Hypoglycemic overnight,  decrease Lantus and meal coverage, resume insulin pump at discharge  Essential hypertension: Losartan, metoprolol and nifedipine on hold  Hypothyroidism continue with Synthroid.  Hypomagnesemia: Replete.  Estimated body mass index is 20.03 kg/m as calculated from the following:   Height as of this encounter: 5\' 2"  (1.575 m).   Weight as of this encounter: 49.7 kg.   DVT prophylaxis: Lovenox Code Status: DNR DNI Family Communication: No family at bedside, discussed with spouse at bedside Disposition Plan:  Status is: Inpatient Recurrent flash pulmonary edema in the setting of CAD and severe AS, plan for DC after TAVR on Tuesday  Dispo: The patient is from: Home              Anticipated d/c is to: Home               Anticipated d/c date is: 2-3 days recurrent pulmonary edema requiring BiPAP, plan for TAVR Tuesday              Patient currently; not medical stable for discharge   Consultants:   Cardiology  Procedures:   Echo  Antimicrobials:  None  Subjective: Overall better, breathing is improving, no further hypoglycemic events Objective: Vitals:   09/11/19 2210 09/12/19 0334 09/12/19 0818 09/12/19 0831  BP: 117/86 115/63 09/14/19)  118/104 117/64  Pulse: 88 96 86 (!) 105  Resp: 18 18 17    Temp: 97.6 F (36.4 C) 97.8 F (36.6 C) 97.7 F (36.5 C)   TempSrc: Oral Oral Oral   SpO2: 100% 91% 99% 98%  Weight:  49.7 kg    Height:        Intake/Output Summary  (Last 24 hours) at 09/12/2019 1119 Last data filed at 09/12/2019 0900 Gross per 24 hour  Intake 620 ml  Output 1900 ml  Net -1280 ml   Filed Weights   09/10/19 0503 09/11/19 0049 09/12/19 0334  Weight: 50.6 kg 50 kg 49.7 kg    Examination:  General exam: Elderly frail female sitting up in the recliner, AAO x3, no distress HEENT: No JVD CVS: S1-S2 regular rate rhythm, loud systolic ejection murmur Lungs: Scant basilar rales, otherwise clear Abdomen is soft, nontender, positive bowel sounds Extremities: No edema Neuro moves all extremities, no localizing signs  Psych: Appropriate mood and affect  Data Reviewed: I have personally reviewed following labs and imaging studies  CBC: Recent Labs  Lab 09/06/19 0816 09/08/19 0446 09/09/19 0419 09/11/19 0650  WBC 6.0 7.2 7.3 5.9  HGB 10.4* 9.8* 9.8* 10.0*  HCT 32.3* 30.8* 32.0* 32.0*  MCV 90.7 91.4 94.4 92.2  PLT 260 251 268 303   Basic Metabolic Panel: Recent Labs  Lab 09/06/19 0816 09/07/19 0550 09/07/19 1634 09/08/19 0446 09/09/19 0419 09/11/19 0650 09/12/19 0430  NA 135   < > 136 139 139 136 138  K 2.8*   < > 3.4* 3.2* 3.8 4.1 5.1  CL 100   < > 102 103 101 102 101  CO2 22   < > 22 26 27 26 23   GLUCOSE 226*   < > 237* 98 120* 77 135*  BUN 58*   < > 35* 31* 32* 34* 35*  CREATININE 2.88*   < > 1.50* 1.33* 1.38* 1.02* 1.20*  CALCIUM 8.1*   < > 7.9* 8.5* 8.5* 8.2* 8.4*  MG  --   --   --   --   --   --  2.1  PHOS 2.9  --   --   --   --   --   --    < > = values in this interval not displayed.   GFR: Estimated Creatinine Clearance: 25.9 mL/min (A) (by C-G formula based on SCr of 1.2 mg/dL (H)). Liver Function Tests: Recent Labs  Lab 09/06/19 0816  ALBUMIN 2.9*   No results for input(s): LIPASE, AMYLASE in the last 168 hours. No results for input(s): AMMONIA in the last 168 hours. Coagulation Profile: Recent Labs  Lab 09/12/19 0908  INR 1.0   Cardiac Enzymes: No results for input(s): CKTOTAL, CKMB,  CKMBINDEX, TROPONINI in the last 168 hours. BNP (last 3 results) No results for input(s): PROBNP in the last 8760 hours. HbA1C: No results for input(s): HGBA1C in the last 72 hours. CBG: Recent Labs  Lab 09/11/19 0632 09/11/19 1119 09/11/19 1606 09/11/19 2143 09/12/19 0548  GLUCAP 80 232* 259* 120* 166*   Lipid Profile: No results for input(s): CHOL, HDL, LDLCALC, TRIG, CHOLHDL, LDLDIRECT in the last 72 hours. Thyroid Function Tests: No results for input(s): TSH, T4TOTAL, FREET4, T3FREE, THYROIDAB in the last 72 hours. Anemia Panel: No results for input(s): VITAMINB12, FOLATE, FERRITIN, TIBC, IRON, RETICCTPCT in the last 72 hours. Sepsis Labs: No results for input(s): PROCALCITON, LATICACIDVEN in the last 168 hours.  Recent Results (from the past 240  hour(s))  Urine Culture     Status: Abnormal   Collection Time: 09/05/19 11:40 AM   Specimen: Urine, Random  Result Value Ref Range Status   Specimen Description URINE, RANDOM  Final   Special Requests   Final    NONE Performed at [facility], [address] [place], [place] [address]    Culture >=100,000 COLONIES/mL ESCHERICHIA COLI (A)  Final   Report Status 09/07/2019 FINAL  Final   Organism ID, Bacteria ESCHERICHIA COLI (A)  Final      Susceptibility   Escherichia coli - MIC*    AMPICILLIN 4 SENSITIVE Sensitive     CEFAZOLIN <=4 SENSITIVE Sensitive     CEFTRIAXONE <=1 SENSITIVE Sensitive     CIPROFLOXACIN <=0.25 SENSITIVE Sensitive     GENTAMICIN <=1 SENSITIVE Sensitive     IMIPENEM <=0.25 SENSITIVE Sensitive     NITROFURANTOIN <=16 SENSITIVE Sensitive     TRIMETH/SULFA <=20 SENSITIVE Sensitive     AMPICILLIN/SULBACTAM <=2 SENSITIVE Sensitive     PIP/TAZO <=4 SENSITIVE Sensitive     * >=100,000 COLONIES/mL ESCHERICHIA COLI         Radiology Studies: No results found.      Scheduled Meds: . aspirin  81 mg Oral Daily  . bisacodyl  5 mg Oral Once  . furosemide  40 mg Intravenous Q12H  . heparin  injection (subcutaneous)  5,000 Units Subcutaneous Q8H  . insulin aspart  0-5 Units Subcutaneous QHS  . insulin aspart  0-6 Units Subcutaneous TID WC  . insulin aspart  2 Units Subcutaneous TID WC  . insulin glargine  10 Units Subcutaneous Daily  . levothyroxine  50 mcg Oral QAC breakfast  . [START ON 10/01/2019] magnesium sulfate  40 mEq Other To OR  . [START ON 10/08/2019] potassium chloride  80 mEq Other To OR  . senna-docusate  1 tablet Oral BID  . simvastatin  20 mg Oral q1800  . sodium chloride flush  3 mL Intravenous Q12H   Continuous Infusions: . sodium chloride 250 mL (09/08/19 0852)  . [START ON 10/10/2019] cefUROXime (ZINACEF)  IV    . [START ON 10/03/2019] dexmedetomidine    . [START ON 09/20/2019] heparin 30,000 units/NS 1000 mL solution for CELLSAVER    . [START ON 10/09/2019] norepinephrine (LEVOPHED) Adult infusion    . [START ON 09/14/2019] vancomycin       LOS: 14 days    Time spent: 25 minutes  Domenic Polite, MD [facility]   09/12/2019, 11:19 AM

## 2019-09-13 ENCOUNTER — Inpatient Hospital Stay (HOSPITAL_COMMUNITY): Payer: Medicare Other | Admitting: Certified Registered"

## 2019-09-13 ENCOUNTER — Inpatient Hospital Stay (HOSPITAL_COMMUNITY): Payer: Medicare Other

## 2019-09-13 ENCOUNTER — Encounter (HOSPITAL_COMMUNITY): Admission: EM | Disposition: E | Payer: Self-pay | Source: Home / Self Care | Attending: Internal Medicine

## 2019-09-13 ENCOUNTER — Encounter (HOSPITAL_COMMUNITY): Payer: Self-pay | Admitting: Internal Medicine

## 2019-09-13 DIAGNOSIS — Z952 Presence of prosthetic heart valve: Secondary | ICD-10-CM

## 2019-09-13 DIAGNOSIS — Z006 Encounter for examination for normal comparison and control in clinical research program: Secondary | ICD-10-CM

## 2019-09-13 DIAGNOSIS — I350 Nonrheumatic aortic (valve) stenosis: Secondary | ICD-10-CM

## 2019-09-13 DIAGNOSIS — N179 Acute kidney failure, unspecified: Secondary | ICD-10-CM

## 2019-09-13 HISTORY — PX: TEE WITHOUT CARDIOVERSION: SHX5443

## 2019-09-13 HISTORY — PX: TRANSCATHETER AORTIC VALVE REPLACEMENT, TRANSFEMORAL: SHX6400

## 2019-09-13 LAB — POCT I-STAT, CHEM 8
BUN: 42 mg/dL — ABNORMAL HIGH (ref 8–23)
BUN: 42 mg/dL — ABNORMAL HIGH (ref 8–23)
Calcium, Ion: 1.15 mmol/L (ref 1.15–1.40)
Calcium, Ion: 1.12 mmol/L — ABNORMAL LOW (ref 1.15–1.40)
Chloride: 98 mmol/L (ref 98–111)
Chloride: 96 mmol/L — ABNORMAL LOW (ref 98–111)
Creatinine, Ser: 1.6 mg/dL — ABNORMAL HIGH (ref 0.44–1.00)
Creatinine, Ser: 1.5 mg/dL — ABNORMAL HIGH (ref 0.44–1.00)
Glucose, Bld: 344 mg/dL — ABNORMAL HIGH (ref 70–99)
Glucose, Bld: 337 mg/dL — ABNORMAL HIGH (ref 70–99)
HCT: 32 % — ABNORMAL LOW (ref 36.0–46.0)
HCT: 28 % — ABNORMAL LOW (ref 36.0–46.0)
Hemoglobin: 10.9 g/dL — ABNORMAL LOW (ref 12.0–15.0)
Hemoglobin: 9.5 g/dL — ABNORMAL LOW (ref 12.0–15.0)
Potassium: 5.7 mmol/L — ABNORMAL HIGH (ref 3.5–5.1)
Potassium: 5.7 mmol/L — ABNORMAL HIGH (ref 3.5–5.1)
Sodium: 132 mmol/L — ABNORMAL LOW (ref 135–145)
Sodium: 130 mmol/L — ABNORMAL LOW (ref 135–145)
TCO2: 23 mmol/L (ref 22–32)
TCO2: 25 mmol/L (ref 22–32)

## 2019-09-13 LAB — CBC
HCT: 33 % — ABNORMAL LOW (ref 36.0–46.0)
Hemoglobin: 10.1 g/dL — ABNORMAL LOW (ref 12.0–15.0)
MCH: 28.9 pg (ref 26.0–34.0)
MCHC: 30.6 g/dL (ref 30.0–36.0)
MCV: 94.6 fL (ref 80.0–100.0)
Platelets: 359 10*3/uL (ref 150–400)
RBC: 3.49 MIL/uL — ABNORMAL LOW (ref 3.87–5.11)
RDW: 15.6 % — ABNORMAL HIGH (ref 11.5–15.5)
WBC: 7.2 10*3/uL (ref 4.0–10.5)
nRBC: 0 % (ref 0.0–0.2)

## 2019-09-13 LAB — BASIC METABOLIC PANEL WITH GFR
Anion gap: 14 (ref 5–15)
BUN: 43 mg/dL — ABNORMAL HIGH (ref 8–23)
CO2: 22 mmol/L (ref 22–32)
Calcium: 8.7 mg/dL — ABNORMAL LOW (ref 8.9–10.3)
Chloride: 98 mmol/L (ref 98–111)
Creatinine, Ser: 1.66 mg/dL — ABNORMAL HIGH (ref 0.44–1.00)
GFR calc Af Amer: 32 mL/min — ABNORMAL LOW
GFR calc non Af Amer: 27 mL/min — ABNORMAL LOW
Glucose, Bld: 232 mg/dL — ABNORMAL HIGH (ref 70–99)
Potassium: 5 mmol/L (ref 3.5–5.1)
Sodium: 134 mmol/L — ABNORMAL LOW (ref 135–145)

## 2019-09-13 LAB — GLUCOSE, CAPILLARY
Glucose-Capillary: 190 mg/dL — ABNORMAL HIGH (ref 70–99)
Glucose-Capillary: 254 mg/dL — ABNORMAL HIGH (ref 70–99)

## 2019-09-13 LAB — ECHOCARDIOGRAM LIMITED
Height: 62 in
Weight: 1753.6 oz

## 2019-09-13 LAB — SURGICAL PCR SCREEN
MRSA, PCR: NEGATIVE
Staphylococcus aureus: NEGATIVE

## 2019-09-13 LAB — POCT ACTIVATED CLOTTING TIME
Activated Clotting Time: 138 seconds
Activated Clotting Time: 207 seconds

## 2019-09-13 SURGERY — IMPLANTATION, AORTIC VALVE, TRANSCATHETER, FEMORAL APPROACH
Anesthesia: Monitor Anesthesia Care | Site: Chest

## 2019-09-13 MED ORDER — LIDOCAINE HCL (PF) 1 % IJ SOLN
INTRAMUSCULAR | Status: AC
  Filled 2019-09-13: qty 30

## 2019-09-13 MED ORDER — FENTANYL CITRATE (PF) 250 MCG/5ML IJ SOLN
INTRAMUSCULAR | Status: DC | PRN
  Administered 2019-09-13: 50 ug via INTRAVENOUS
  Administered 2019-09-13: 25 ug via INTRAVENOUS

## 2019-09-13 MED ORDER — IOHEXOL 350 MG/ML SOLN
INTRAVENOUS | Status: DC | PRN
  Administered 2019-09-13: 30 mL

## 2019-09-13 MED ORDER — HEPARIN (PORCINE) IN NACL 1000-0.9 UT/500ML-% IV SOLN
INTRAVENOUS | Status: AC
  Filled 2019-09-13: qty 1500

## 2019-09-13 MED ORDER — PHENYLEPHRINE 40 MCG/ML (10ML) SYRINGE FOR IV PUSH (FOR BLOOD PRESSURE SUPPORT)
PREFILLED_SYRINGE | INTRAVENOUS | Status: DC | PRN
  Administered 2019-09-13 (×2): 80 ug via INTRAVENOUS
  Administered 2019-09-13: 120 ug via INTRAVENOUS

## 2019-09-13 MED ORDER — LACTATED RINGERS IV SOLN: INTRAVENOUS | Status: DC | PRN

## 2019-09-13 MED ORDER — HEPARIN SODIUM (PORCINE) 1000 UNIT/ML IJ SOLN
INTRAMUSCULAR | Status: DC | PRN
  Administered 2019-09-13: 8000 [IU] via INTRAVENOUS
  Administered 2019-09-13: 5000 [IU] via INTRAVENOUS

## 2019-09-13 MED ORDER — HEPARIN (PORCINE) IN NACL 1000-0.9 UT/500ML-% IV SOLN
INTRAVENOUS | Status: DC | PRN
  Administered 2019-09-13 (×2): 500 mL

## 2019-09-13 MED ORDER — EPINEPHRINE 1 MG/10ML IJ SOSY
PREFILLED_SYRINGE | INTRAMUSCULAR | Status: DC | PRN
Start: 2019-09-13 — End: 2019-09-13
  Administered 2019-09-13: 1 mg via INTRAVENOUS

## 2019-09-13 MED ORDER — LIDOCAINE HCL (PF) 1 % IJ SOLN
INTRAMUSCULAR | Status: DC | PRN
  Administered 2019-09-13: 30 mL

## 2019-09-13 MED ORDER — PROPOFOL 10 MG/ML IV BOLUS
INTRAVENOUS | Status: DC | PRN
Start: 2019-09-13 — End: 2019-09-13
  Administered 2019-09-13: 20 mg via INTRAVENOUS

## 2019-09-13 MED ORDER — LACTATED RINGERS IV SOLN
INTRAVENOUS | Status: DC | PRN
Start: 2019-09-13 — End: 2019-09-13

## 2019-09-13 MED ORDER — IOHEXOL 350 MG/ML SOLN
INTRAVENOUS | Status: AC
  Filled 2019-09-13: qty 1

## 2019-09-13 SURGICAL SUPPLY — 33 items
BAG SNAP BAND KOVER 36X36 (MISCELLANEOUS) ×10 IMPLANT
BLANKET WARM UNDERBOD FULL ACC (MISCELLANEOUS) ×4 IMPLANT
CABLE ADAPT PACING TEMP 12FT (ADAPTER) ×2 IMPLANT
CATH 23 ULTRA DELIVERY (CATHETERS) ×2 IMPLANT
CATH DIAG 6FR PIGTAIL ANGLED (CATHETERS) ×4 IMPLANT
CATH INFINITI 6F AL1 (CATHETERS) ×2 IMPLANT
CATH S G BIP PACING (CATHETERS) ×2 IMPLANT
CATH SUPER TORQUE PLUS 6F MPA1 (CATHETERS) ×2 IMPLANT
CRIMPER (MISCELLANEOUS) ×2 IMPLANT
DERMABOND ADVANCED (GAUZE/BANDAGES/DRESSINGS) ×2
DERMABOND ADVANCED .7 DNX12 (GAUZE/BANDAGES/DRESSINGS) IMPLANT
DEVICE CLOSURE PERCLS PRGLD 6F (VASCULAR PRODUCTS) IMPLANT
ELECT DEFIB PAD ADLT CADENCE (PAD) ×2 IMPLANT
GUIDEWIRE SAFE TJ AMPLATZ EXST (WIRE) ×2 IMPLANT
KIT HEART LEFT (KITS) ×4 IMPLANT
KIT MICROPUNCTURE NIT STIFF (SHEATH) ×2 IMPLANT
PACK CARDIAC CATHETERIZATION (CUSTOM PROCEDURE TRAY) ×4 IMPLANT
PERCLOSE PROGLIDE 6F (VASCULAR PRODUCTS) ×8
SHEATH 14X36 EDWARDS (SHEATH) ×2 IMPLANT
SHEATH BRITE TIP 7FR 35CM (SHEATH) ×2 IMPLANT
SHEATH PINNACLE 6F 10CM (SHEATH) ×2 IMPLANT
SHEATH PINNACLE 8F 10CM (SHEATH) ×2 IMPLANT
SHEATH PROBE COVER 6X72 (BAG) ×2 IMPLANT
SLEEVE REPOSITIONING LENGTH 30 (MISCELLANEOUS) ×2 IMPLANT
STOPCOCK MORSE 400PSI 3WAY (MISCELLANEOUS) ×10 IMPLANT
SYR MEDRAD MARK V 150ML (SYRINGE) ×2 IMPLANT
TRANSDUCER W/STOPCOCK (MISCELLANEOUS) ×8 IMPLANT
TUBE CONN 8.8X1320 FR HP M-F (CONNECTOR) ×2 IMPLANT
TUBING CIL FLEX 10 FLL-RA (TUBING) ×2 IMPLANT
WIRE AMPLATZ SS-J .035X180CM (WIRE) ×2 IMPLANT
WIRE EMERALD 3MM-J .035X150CM (WIRE) ×2 IMPLANT
WIRE EMERALD 3MM-J .035X260CM (WIRE) ×2 IMPLANT
WIRE EMERALD ST .035X260CM (WIRE) ×2 IMPLANT

## 2019-09-13 DEATH — deceased

## 2019-09-14 ENCOUNTER — Telehealth: Payer: Self-pay | Admitting: Cardiovascular Disease

## 2019-09-14 NOTE — Telephone Encounter (Signed)
I spoke with Synetta Fail and the funeral home will bring the death certificate to the office tomorrow morning

## 2019-09-14 NOTE — Telephone Encounter (Signed)
New Message   Synetta Fail is calling from [employer] home and is wondering if Dr Clifton James will be signing the death certificate.  She is also wondering if we use NCDAVE    Please advise

## 2019-09-14 NOTE — Telephone Encounter (Signed)
Per medical records we do not use NCDAVE

## 2019-09-14 NOTE — Telephone Encounter (Signed)
I can come by the office tomorrow and sign it.

## 2019-09-15 NOTE — Anesthesia Postprocedure Evaluation (Signed)
Anesthesia Post Note  Patient: Nancy Mcguire  Procedure(s) Performed: TRANSCATHETER AORTIC VALVE REPLACEMENT, TRANSFEMORAL (N/A Chest) TRANSESOPHAGEAL ECHOCARDIOGRAM (TEE) (N/A )    Patient passed away during procedure.       Tenea Sens

## 2019-10-13 NOTE — Interval H&P Note (Signed)
History and Physical Interval Note:  09/17/2019 6:40 AM  Nancy Mcguire  has presented today for surgery, with the diagnosis of Severe Aortic Stenosis.  The various methods of treatment have been discussed with the patient and family. After consideration of risks, benefits and other options for treatment, the patient has consented to  Procedure(s): TRANSCATHETER AORTIC VALVE REPLACEMENT, TRANSFEMORAL (N/A) TRANSESOPHAGEAL ECHOCARDIOGRAM (TEE) (N/A) as a surgical intervention.  The patient's history has been reviewed, patient examined, no change in status, stable for surgery.  I have reviewed the patient's chart and labs.  Questions were answered to the patient's satisfaction.     Alleen Borne

## 2019-10-13 NOTE — Transfer of Care (Signed)
Immediate Anesthesia Transfer of Care Note  Patient: Nancy Mcguire  Patient deceased during procedure Last Vitals:  Vitals Value Taken Time  BP    Temp    Pulse 166 10-10-19 0920  Resp    SpO2

## 2019-10-13 NOTE — Anesthesia Preprocedure Evaluation (Addendum)
Anesthesia Evaluation  Patient identified by MRN, date of birth, ID band Patient awake    Reviewed: Allergy & Precautions, NPO status , Patient's Chart, lab work & pertinent test results, reviewed documented beta blocker date and time   History of Anesthesia Complications Negative for: history of anesthetic complications  Airway Mallampati: II  TM Distance: >3 FB Neck ROM: Full    Dental  (+) Missing   Pulmonary shortness of breath, neg sleep apnea, neg COPD, neg recent URI,    breath sounds clear to auscultation       Cardiovascular hypertension, + angina + CAD, + CABG and + DOE   Rhythm:Regular + Systolic murmurs 1. Left ventricular ejection fraction, by estimation, is 60 to 65%. The  left ventricle has normal function. The left ventricle has no regional  wall motion abnormalities. There is mild left ventricular hypertrophy.  Left ventricular diastolic parameters  are consistent with Grade II diastolic dysfunction (pseudonormalization).  Elevated left ventricular end-diastolic pressure.  2. Right ventricular systolic function is mildly reduced. The right  ventricular size is normal. There is severely elevated pulmonary artery  systolic pressure. The estimated right ventricular systolic pressure is  24.2 mmHg.  3. Left atrial size was mildly dilated.  4. Right atrial size was mildly dilated.  5. The mitral valve is abnormal. Mild to moderate mitral valve  regurgitation.  6. Tricuspid valve regurgitation is mild to moderate.  7. The aortic valve is tricuspid. Aortic valve regurgitation is trivial.  Severe aortic valve stenosis. Aortic valve area, by VTI measures 0.36 cm.  Aortic valve mean gradient measures 51.0 mmHg. Aortic valve Vmax measures  4.68 m/s.  8. The inferior vena cava is dilated in size with <50% respiratory  variability, suggesting right atrial pressure of 15 mmHg.   1. Left ventricular ejection  fraction, by estimation, is 60 to 65%. The  left ventricle has normal function. The left ventricle has no regional  wall motion abnormalities. There is mild left ventricular hypertrophy.  Left ventricular diastolic parameters  are consistent with Grade II diastolic dysfunction (pseudonormalization).  Elevated left ventricular end-diastolic pressure.  2. Right ventricular systolic function is mildly reduced. The right  ventricular size is normal. There is severely elevated pulmonary artery  systolic pressure. The estimated right ventricular systolic pressure is  68.3 mmHg.  3. Left atrial size was mildly dilated.  4. Right atrial size was mildly dilated.  5. The mitral valve is abnormal. Mild to moderate mitral valve  regurgitation.  6. Tricuspid valve regurgitation is mild to moderate.  7. The aortic valve is tricuspid. Aortic valve regurgitation is trivial.  Severe aortic valve stenosis. Aortic valve area, by VTI measures 0.36 cm.  Aortic valve mean gradient measures 51.0 mmHg. Aortic valve Vmax measures  4.68 m/s.  8. The inferior vena cava is dilated in size with <50% respiratory  variability, suggesting right atrial pressure of 15 mmHg. 1. Left ventricular ejection fraction, by estimation, is 60 to 65%. The  left ventricle has normal function. The left ventricle has no regional  wall motion abnormalities. There is mild left ventricular hypertrophy.  Left ventricular diastolic parameters  are consistent with Grade II diastolic dysfunction (pseudonormalization).  Elevated left ventricular end-diastolic pressure.  2. Right ventricular systolic function is mildly reduced. The right  ventricular size is normal. There is severely elevated pulmonary artery  systolic pressure. The estimated right ventricular systolic pressure is  41.9 mmHg.  3. Left atrial size was mildly dilated.  4. Right atrial size  was mildly dilated.  5. The mitral valve is abnormal. Mild to moderate  mitral valve  regurgitation.  6. Tricuspid valve regurgitation is mild to moderate.  7. The aortic valve is tricuspid. Aortic valve regurgitation is trivial.  Severe aortic valve stenosis. Aortic valve area, by VTI measures 0.36 cm.  Aortic valve mean gradient measures 51.0 mmHg. Aortic valve Vmax measures  4.68 m/s.  8. The inferior vena cava is dilated in size with <50% respiratory  variability, suggesting right atrial pressure of 15 mmHg.    Neuro/Psych negative neurological ROS  negative psych ROS   GI/Hepatic negative GI ROS, Neg liver ROS,   Endo/Other  diabetes, Type 2Hypothyroidism   Renal/GU CRF and ARFRenal diseaseAcute on CRF admitting Cr 0.84  k 5     Musculoskeletal negative musculoskeletal ROS (+)   Abdominal   Peds  Hematology  (+) Blood dyscrasia, anemia , hgb 10   Anesthesia Other Findings Came in with CP/SOB, b/l effusions  Reproductive/Obstetrics                            Anesthesia Physical Anesthesia Plan  ASA: IV  Anesthesia Plan: MAC   Post-op Pain Management:    Induction:   PONV Risk Score and Plan: 2 and Treatment may vary due to age or medical condition and Propofol infusion  Airway Management Planned: Nasal Cannula  Additional Equipment: Arterial line  Intra-op Plan:   Post-operative Plan:   Informed Consent: I have reviewed the patients History and Physical, chart, labs and discussed the procedure including the risks, benefits and alternatives for the proposed anesthesia with the patient or authorized representative who has indicated his/her understanding and acceptance.   Patient has DNR.  Discussed DNR with patient and Continue DNR.   Dental advisory given  Plan Discussed with: CRNA  Anesthesia Plan Comments:        Anesthesia Quick Evaluation

## 2019-10-13 NOTE — Progress Notes (Signed)
Pt woke up feeling warm due to temperature of the room. Pt complained of nausea. Pt reports feeling anxious about procedure.  Administered PRN medication. Pt resting comfortably.  Notified provider.

## 2019-10-13 NOTE — Op Note (Signed)
[facility]   MULTIDISCIPLINARY HEART VALVE TEAM   TAVR OPERATIVE NOTE   Date of Procedure:  2019/09/17  Preoperative Diagnosis: Severe Aortic Stenosis   Postoperative Diagnosis: Same   Procedure:   Transcatheter Aortic Valve Replacement- Transfemoral Approach  Co-Surgeons:  Alleen Borne, MD and Verne Carrow, MD   Anesthesiologist:  C. Maple Hudson, MD  Echocardiographer:  Eloy End, MD  Pre-operative Echo Findings:  Severe aortic stenosis  Severe left ventricular systolic dysfunction    BRIEF CLINICAL NOTE AND INDICATIONS FOR SURGERY  The patientis an 86 y.o.femalewith a hx of HTN, HLD, CAD s/p remote CABG x3, CKD stage IIIA, DMT1, pulmonary HTNand severe aortic stenosis who was admitted with CHF and was scheduled for TAVR  for critical AS today.   During the course of the patient's preoperative work up they have been evaluated comprehensively by a multidisciplinary team of specialists coordinated through the Multidisciplinary Heart Valve Clinic in the [facility].  They have been demonstrated to suffer from symptomatic severe aortic stenosis as noted above. The patient has been counseled extensively as to the relative risks and benefits of all options for the treatment of severe aortic stenosis including long term medical therapy, conventional surgery for aortic valve replacement, and transcatheter aortic valve replacement.  All questions have been answered, and the patient provides full informed consent for the operation as described. She clearly stated preop that she did not want any heroic measures performed if she had cardiac arrest or any other complication of TAVR including no CPR, no intubation, no defibrillation.   DETAILS OF THE OPERATIVE PROCEDURE  PREPARATION:    The patient was brought to the operating room on the above mentioned date and appropriate monitoring was established by the anesthesia team. The patient  was placed in the supine position on the operating table.  Intravenous antibiotics were administered. The patient was monitored closely throughout the procedure under conscious sedation.  Baseline transthoracic echocardiogram was performed. This showed severe LV systolic dysfunction with an EF of 20-25% which was a marked change from her prior echo on 08/30/2019 when her EF was 60-65%. The patient's abdomen and both groins were prepped and draped in a sterile manner. A time out procedure was performed.   PERIPHERAL ACCESS:    Using the modified Seldinger technique, femoral arterial and venous access was obtained with placement of 6 Fr sheaths on the right side.  A pigtail diagnostic catheter was passed through the right arterial sheath under fluoroscopic guidance into the aortic root.  A temporary transvenous pacemaker catheter was passed through the right femoral venous sheath under fluoroscopic guidance into the right ventricle.  The pacemaker was tested to ensure stable lead placement and pacemaker capture. Aortic root angiography was performed in order to determine the optimal angiographic angle for valve deployment.   TRANSFEMORAL ACCESS:   Percutaneous transfemoral access and sheath placement was performed using ultrasound guidance.  The left common femoral artery was cannulated using a micropuncture needle and appropriate location was verified using hand injection angiogram.  A pair of Abbott Perclose percutaneous closure devices were placed and a 6 French sheath replaced into the femoral artery.  The patient was heparinized systemically and ACT verified > 250 seconds.    A 14 Fr transfemoral E-sheath was introduced into the left common femoral artery after progressively dilating over an Amplatz superstiff wire. An AL-1 catheter was used to direct a straight-tip exchange length wire across the native aortic valve into the  left ventricle. It was not possible to pass a pigtail catheter across the  valve and therefore a Multi-purpose catheter was used to to cross the valve. Simultaneous LV and Ao pressures were recorded.This was changed out for an Amplatz Extra-stiff wire in the LV apex.    BALLOON AORTIC VALVULOPLASTY:   We decided to perform BAV since the pigtail catheter would not cross the aortic valve. Balloon aortic valvuloplasty was performed using a 20  mm valvuloplasty balloon.  Once optimal position was achieved, BAV was done under rapid ventricular pacing. The balloon moved distally into the LV during the first pacing run and therefore a second pacing ru was needed. The second balloon inflation resulted in good dilation at the valve level. Unfortunately she did not recover hemodynamically after that with a persistent BP in the 50's. Since she desired to be DNR with no CPR or defibrillation we did not perform any heroic life saving measures except epinephrine via the central line. This did not change her hemodynamics and she fibrillated. TEE showed no pericardial effusion or annular rupture. There was no cardiac activity. She was declared dead at 31:05 am.   Gaye Pollack, MD 2019-09-17

## 2019-10-13 NOTE — Anesthesia Procedure Notes (Signed)
Arterial Line Insertion Start/EndJun 11, 2021 7:18 AM, 2019-09-23 7:25 AM Performed by: Val Eagle, MD, anesthesiologist  Patient location: Pre-op. Preanesthetic checklist: patient identified, IV checked, site marked, risks and benefits discussed, surgical consent, monitors and equipment checked, pre-op evaluation, timeout performed and anesthesia consent Lidocaine 1% used for infiltration and patient sedated Right, radial was placed Catheter size: 20 G Hand hygiene performed  and maximum sterile barriers used   Attempts: 1 Procedure performed without using ultrasound guided technique. Following insertion, dressing applied and Biopatch. Post procedure assessment: normal and unchanged  Patient tolerated the procedure well with no immediate complications.

## 2019-10-13 NOTE — CV Procedure (Signed)
[facility]  TAVR OPERATIVE NOTE   Date of Procedure:  09/24/2019  Preoperative Diagnosis: Severe Aortic Stenosis   Postoperative Diagnosis: Same   Procedure:    Transcatheter Aortic Valve Replacement - Transfemoral Approach )   Co-Surgeons:  Lauree Barfield, MD and Gaye Pollack, MD   Anesthesiologist:  Ermalene Postin  Echocardiographer:  Meda Coffee  Pre-operative Echo Findings:  Severe aortic stenosis  Severe left ventricular systolic dysfunction  BRIEF CLINICAL NOTE AND INDICATIONS FOR SURGERY  Nancy Mcguire is a 86 y.o. female with a hx of HTN, HLD, CAD s/p remote CABG x3, CKD stage IIIA, DMT1 on an insulin pump (diagnosed in her 42s), pulmonary HTN and severe aortic stenosis who was admitted with CHF and had plans in place for TAVR for critical AS today.   During the course of the patient's preoperative work up they have been evaluated comprehensively by a multidisciplinary team of specialists coordinated through the [facility] in the [facility].  They have been demonstrated to suffer from symptomatic severe aortic stenosis as noted above. The patient has been counseled extensively as to the relative risks and benefits of all options for the treatment of severe aortic stenosis including long term medical therapy, conventional surgery for aortic valve replacement, and transcatheter aortic valve replacement.  The patient has been independently evaluated by Dr. Roxy Manns with CT surgery and they are felt to be at high risk for conventional surgical aortic valve replacement. The surgeon indicated the patient would be a poor candidate for conventional surgery. Based upon review of all of the patient's preoperative diagnostic tests they are felt to be candidate for transcatheter aortic valve replacement using the transfemoral approach as an alternative to high risk conventional surgery.    Following the decision to proceed with  transcatheter aortic valve replacement, a discussion has been held regarding what types of management strategies would be attempted intraoperatively in the event of life-threatening complications, including whether or not the patient would be considered a candidate for the use of cardiopulmonary bypass and/or conversion to open sternotomy for attempted surgical intervention.  The patient has been advised of a variety of complications that might develop peculiar to this approach including but not limited to risks of death, stroke, paravalvular leak, aortic dissection or other major vascular complications, aortic annulus rupture, device embolization, cardiac rupture or perforation, acute myocardial infarction, arrhythmia, heart block or bradycardia requiring permanent pacemaker placement, congestive heart failure, respiratory failure, renal failure, pneumonia, infection, other late complications related to structural valve deterioration or migration, or other complications that might ultimately cause a temporary or permanent loss of functional independence or other long term morbidity.  The patient provides full informed consent for the procedure as described and all questions were answered preoperatively.    DETAILS OF THE OPERATIVE PROCEDURE  PREPARATION:   The patient is brought to the operating room on the above mentioned date and central monitoring was established by the anesthesia team including placement of a radial arterial line. The patient is placed in the supine position on the operating table.  Intravenous antibiotics are administered. Conscious sedation is used.   Baseline transthoracic echocardiogram was performed. The patient's chest, abdomen, both groins, and both lower extremities are prepared and draped in a sterile manner. A time out procedure is performed.   PERIPHERAL ACCESS:   Using the modified Seldinger technique, femoral arterial and venous access were obtained with placement of 6  Fr sheaths on the right side.  A pigtail diagnostic catheter was passed through the femoral arterial sheath under fluoroscopic guidance into the aortic root.  A temporary transvenous pacemaker catheter was passed through the femoral venous sheath under fluoroscopic guidance into the right ventricle.  The pacemaker was tested to ensure stable lead placement and pacemaker capture. Aortic root angiography was performed in order to determine the optimal angiographic angle for valve deployment.  TRANSFEMORAL ACCESS:  A micropuncture kit was used to gain access to the left femoral artery. Position confirmed with angiography. Pre-closure with double ProGlide closure devices. The patient was heparinized systemically and ACT verified > 250 seconds.    A 14 Fr transfemoral E-sheath was introduced into the left femoral artery after progressively dilating over an Amplatz superstiff wire. An AL-1catheter was used to direct a straight-tip exchange length wire across the native aortic valve into the left ventricle. The pigtail catheter would not cross the valve. I then used a Multi-purpose catheter to cross into the LV and then changed out this catheter for an Amplatz Extra-stiff wire in the LV apex.   Due to her critical AS, we elected to perform balloon valvuloplasty prior to valve deployment as the valve could not even be easily crossed with the diagnostic catheters. There were two pacing runs for balloon valvuloplasty as the balloon moved forward on the first run. Following the second balloon valvuloplasty and pacing run, her blood pressure did not recover. The patient had requested prior to the case that her DNR status not be reversed for this case. She did not wish to have CPR or defibrillation if necessary. Per the patient's wishes, we did not attempt life saving maneuvers following balloon valvuloplasty.   Time of death 9:05 am  Lauree Oatis MD Oct 13, 2019 9:34 AM

## 2019-10-13 NOTE — Progress Notes (Signed)
Patient's glasses and bilateral hearing aids removed in short stay. Hearing aids placed in pink cup with patient label and glasses placed in plastic bag with label.   Reached out to patient's husband but he has not arrived to hospital at this time - stated he should be arriving around 0800.  Patient's husband stated he would call when he arrives to receive belongings.

## 2019-10-13 NOTE — Death Summary Note (Signed)
[facility]   MULTIDISCIPLINARY HEART VALVE TEAM  Discharge Summary    Patient ID: Nancy Mcguire MRN: [id number]; DOB: [birth year]  Admit date: 09-17-2019 Discharge date: 09/26/2019  Primary Care Provider: Angelica Chessman, MD  Primary Cardiologist: Nanetta Batty, MD  Discharge Diagnoses    Principal Problem:   Acute diastolic heart failure [facility]) Active Problems:   Hyperlipidemia   Essential hypertension   Coronary artery disease   Diabetes type 1, controlled (HCC)   Cardiac murmur   Volume overload   Severe aortic stenosis   Aortic atherosclerosis (HCC)   AKI (acute kidney injury) (HCC)   Allergies Allergies  Allergen Reactions  . Neomycin-Bacitracin Zn-Polymyx Other (See Comments)    Eyes irritation   . Hydrocodone-Acetaminophen Nausea And Vomiting    Diagnostic Studies/Procedures    Echo 08/30/19  IMPRESSIONS  1. Left ventricular ejection fraction, by estimation, is 60 to 65%. The  left ventricle has normal function. The left ventricle has no regional  wall motion abnormalities. There is mild left ventricular hypertrophy.  Left ventricular diastolic parameters  are consistent with Grade II diastolic dysfunction (pseudonormalization).  Elevated left ventricular end-diastolic pressure.  2. Right ventricular systolic function is mildly reduced. The right  ventricular size is normal. There is severely elevated pulmonary artery  systolic pressure. The estimated right ventricular systolic pressure is  82.2 mmHg.  3. Left atrial size was mildly dilated.  4. Right atrial size was mildly dilated.  5. The mitral valve is abnormal. Mild to moderate mitral valve  regurgitation.  6. Tricuspid valve regurgitation is mild to moderate.  7. The aortic valve is tricuspid. Aortic valve regurgitation is trivial.  Severe aortic valve stenosis. Aortic valve area, by VTI measures 0.36 cm.  Aortic valve mean gradient measures 51.0 mmHg. Aortic valve Vmax measures    4.68 m/s.  8. The inferior vena cava is dilated in size with <50% respiratory  variability, suggesting right atrial pressure of 15 mmHg.   Comparison(s): Changes from prior study are noted. 01/13/2019: LVEF 60-65%,  severe AS - mean gradient 43 mmHg.   FINDINGS  Left Ventricle: Left ventricular ejection fraction, by estimation, is 60  to 65%. The left ventricle has normal function. The left ventricle has no  regional wall motion abnormalities. The left ventricular internal cavity  size was normal in size. There is  mild left ventricular hypertrophy. Left ventricular diastolic parameters  are consistent with Grade II diastolic dysfunction (pseudonormalization).  Elevated left ventricular end-diastolic pressure.   Right Ventricle: The right ventricular size is normal. No increase in  right ventricular wall thickness. Right ventricular systolic function is  mildly reduced. There is severely elevated pulmonary artery systolic  pressure. The tricuspid regurgitant  velocity is 4.10 m/s, and with an assumed right atrial pressure of 15  mmHg, the estimated right ventricular systolic pressure is 82.2 mmHg.   Left Atrium: Left atrial size was mildly dilated.   Right Atrium: Right atrial size was mildly dilated.   Pericardium: There is no evidence of pericardial effusion.   Mitral Valve: The mitral valve is abnormal. There is moderate thickening  of the mitral valve leaflet(s). There is moderate calcification of the  posterior mitral valve leaflet(s). Moderate mitral annular calcification.  Mild to moderate mitral valve  regurgitation.   Tricuspid Valve: The tricuspid valve is grossly normal. Tricuspid valve  regurgitation is mild to moderate.   Aortic Valve: The aortic valve is tricuspid. Aortic valve regurgitation is  trivial. Severe aortic stenosis is present.  Aortic valve mean gradient  measures 51.0 mmHg. Aortic valve peak gradient measures 87.6 mmHg. Aortic  valve area, by VTI  measures 0.36  cm.   Pulmonic Valve: The pulmonic valve was normal in structure. Pulmonic valve  regurgitation is not visualized.   Aorta: The aortic root and ascending aorta are structurally normal, with  no evidence of dilitation.   Venous: The inferior vena cava is dilated in size with less than 50%  respiratory variability, suggesting right atrial pressure of 15 mmHg.   IAS/Shunts: No atrial level shunt detected by color flow Doppler.    LEFT VENTRICLE  PLAX 2D  LVIDd: 4.20 cm Diastology  LVIDs: 3.10 cm LV e' lateral: 6.76 cm/s  LV PW: 1.10 cm LV E/e' lateral: 18.9  LV IVS: 1.10 cm LV e' medial: 4.04 cm/s  LVOT diam: 1.80 cm LV E/e' medial: 31.7  LV SV: 41  LV SV Index: 27  LVOT Area: 2.54 cm    RIGHT VENTRICLE  RV Basal diam: 2.80 cm  RV S prime: 6.10 cm/s  TAPSE (M-mode): 2.3 cm   LEFT ATRIUM Index RIGHT ATRIUM Index  LA diam: 4.50 cm 2.92 cm/m RA Area: 18.80 cm  LA Vol (A2C): 52.2 ml 33.93 ml/m RA Volume: 54.70 ml 35.55 ml/m  LA Vol (A4C): 45.5 ml 29.57 ml/m  LA Biplane Vol: 48.4 ml 31.46 ml/m  AORTIC VALVE  AV Area (Vmax): 0.39 cm  AV Area (Vmean): 0.34 cm  AV Area (VTI): 0.36 cm  AV Vmax: 468.00 cm/s  AV Vmean: 317.333 cm/s  AV VTI: 1.147 m  AV Peak Grad: 87.6 mmHg  AV Mean Grad: 51.0 mmHg  LVOT Vmax: 71.70 cm/s  LVOT Vmean: 42.800 cm/s  LVOT VTI: 0.163 m  LVOT/AV VTI ratio: 0.14   AORTA  Ao Root diam: 2.60 cm   MITRAL VALVE TRICUSPID VALVE  MV Area (PHT): 5.20 cm TR Peak grad: 67.2 mmHg  MV Decel Time: 146 msec TR Vmax: 410.00 cm/s  MV E velocity: 128.00 cm/s  MV A velocity: 111.00 cm/s SHUNTS  MV E/A ratio: 1.15 Systemic VTI: 0.16 m  Systemic Diam: 1.80 cm  __________________  Carotid dopplers 09/03/2019  Summary:  Right Carotid: Velocities in the right ICA are consistent with a 1-39%  stenosis.   Left Carotid: Velocities in the left ICA are consistent with a 1-39%  stenosis.   Vertebrals: Bilateral vertebral arteries  demonstrate antegrade flow.  Subclavians: Normal flow hemodynamics were seen in bilateral subclavian  arteries.  __________________  [facility] 2019-09-03  RIGHT/LEFT HEART CATH AND CORONARY/GRAFT ANGIOGRAPHY  Conclusion  Prox LAD to Mid LAD lesion is 90% stenosed.  Mid LAD-1 lesion is 90% stenosed.  Mid LAD-2 lesion is 90% stenosed.  Origin to Prox Graft lesion is 100% stenosed.  Ost Cx to Prox Cx lesion is 90% stenosed.  2nd Mrg lesion is 90% stenosed.  Mid Graft to Dist Graft lesion is 60% stenosed.  Prox Graft to Mid Graft lesion is 40% stenosed.  Hemodynamic findings consistent with aortic valve stenosis. Nancy Mcguire is a 86 y.o. female  [id number]  LOCATION: FACILITY: [facility]  PHYSICIAN: Nanetta Batty, M.D.  1929/12/10  DATE OF PROCEDURE: 09-03-19  DATE OF DISCHARGE:   CARDIAC CATHETERIZATION  History obtained from chart review.86 y.o. female with a hx of HTN, CAD (CABG X3 26 yrs ago), DM-1 (on insulin pump) and AS with echo 01/2019 with AVarea by VTI of 0.51 who is being seen today for the evaluation of SOB and severe AS.  IMPRESSION:  Ms. Fine has 2 of 3 grafts patent in the left dominant system. The vein graft to the diagonal branch is occluded at the origin. The LIMA to the LAD is patent with significant proximal LAD disease proximal to LIMA insertion. The vein graft to the circumflex has moderate disease in the mid and distal shaft. The RCA is nondominant. She has severe aortic stenosis with a valve area between 0.6 1.8 by Fick or thermal dilution and EDP of 18. I did shoot her distal abdominal aorta revealing widely patent iliacs arteries. I reviewed the films with Dr. Angelena Form who feels that she is a acceptable candidate for TAVR. I performed angiography of the right common femoral artery and and successfully deployed a MYNX closure device in the right common femoral artery and vein achieving hemostasis. The patient left lab in stable condition.  __________________  Cardiac CT  09/02/19  ADDENDUM:  Extracardiac findings are described separately under dictation for  contemporaneously obtained CTA chest, abdomen and pelvis 07/03/2019.  Please see that dictation for full description of relevant  extracardiac findings.  Electronically Signed  By: Vinnie Langton M.D.  On: 09/05/2019 11:33   Addended by Etheleen Mayhew, MD on 09/05/2019 11:36 AM  Study Result   CLINICAL DATA: Aortic Stenosis  EXAM:  Cardiac TAVR CT  TECHNIQUE:  The patient was scanned on a Siemens Force 149 slice scanner. A 120  kV retrospective scan was triggered in the ascending thoracic aorta  at 140 HU's. Gantry rotation speed was 250 msecs and collimation was  .6 mm. No beta blockade or nitro were given. The 3D data set was  reconstructed in 5% intervals of the R-R cycle. Systolic and  diastolic phases were analyzed on a dedicated work station using  MPR, MIP and VRT modes. The patient received 80 cc of contrast.  FINDINGS:  Aortic Valve: Tri leaflet calcified with restricted leaflet motion  Aorta: No aneurysm normal arch vessels moderate calcific  atherosclerosis  Sino-tubular Junction: 23 mm  Ascending Thoracic Aorta: 26 mm  Aortic Arch: 26 mm  Descending Thoracic Aorta: 19 mm  Sinus of Valsalva Measurements:  Non-coronary: 29 mm  Right - coronary: 28.4 mm  Left - coronary: 28.3 mm  Coronary Artery Height above Annulus:  Left Main: 13.1 mm above annulus  Right Coronary: 14.4 mm above annulus  Virtual Basal Annulus Measurements:  Maximum / Minimum Diameter: 25.6 mm x 20.65 mm  Perimeter: 72.4 mm  Area: 388 mm2  Coronary Arteries: Sufficient height above annulus for deployment  Patent SVG to OM and Patent LIMA to LAD  Optimum Fluoroscopic Angle for Delivery: LAO 14 Caudal 14 degrees  IMPRESSION:  1. Tri leaflet AV with annular area of 388 mm2 suitable for a 23 mm  Sapien 3 valve  2. Coronary arteries suitable height above annulus for deployment  Patent SVG to OM and LIMA to  LAD  3. Normal ascending aortic root diameter 2.6 cm  4. Optimum angiographic angle for deployment LAO 14 Caudal 14  degrees  Nancy Mcguire  __________________  CT angio chest/abd/pelvis 09/02/19  Study Result  CLINICAL DATA: 86 year old female with history of severe aortic  stenosis. Preprocedural study prior to potential transcatheter  aortic valve replacement (TAVR).  EXAM:  CT ANGIOGRAPHY CHEST, ABDOMEN AND PELVIS  TECHNIQUE:  Non-contrast CT of the chest was initially obtained.  Multidetector CT imaging through the chest, abdomen and pelvis was  performed using the standard protocol during bolus administration of  intravenous contrast. Multiplanar reconstructed images and  MIPs were  obtained and reviewed to evaluate the vascular anatomy.  CONTRAST: 80mL OMNIPAQUE IOHEXOL 350 MG/ML SOLN  COMPARISON: No priors.  FINDINGS:  CTA CHEST FINDINGS  Cardiovascular: Heart size is enlarged with biatrial dilatation  (right greater than left). There is no significant pericardial  fluid, thickening or pericardial calcification. There is aortic  atherosclerosis, as well as atherosclerosis of the great vessels of  the mediastinum and the coronary arteries, including calcified  atherosclerotic plaque in the left main, left anterior descending  and left circumflex coronary arteries. Status post median sternotomy  for CABG including LIMA to the LAD. Severe thickening calcification  of the aortic valve. Moderate to severe calcifications of the mitral  annulus, particularly inferiorly.  Mediastinum/Lymph Nodes: No pathologically enlarged mediastinal or  hilar lymph nodes. In the mid esophagus (axial image 75 of series  15) there is a well-defined 1.0 x 0.7 cm high attenuation structure,  which could represent an esophageal varix, or could represent a  duplication cyst containing proteinaceous/calcific content. No  axillary lymphadenopathy.  Lungs/Pleura: A few scattered small pulmonary nodules  are noted,  largest of which measures only 5 mm in the posterior aspect of the  right upper lobe (axial image 32 of series 16). No other larger more  suspicious appearing pulmonary nodules or masses are noted.  Bilateral apical pleuroparenchymal thickening and architectural  distortion (left greater than right), most compatible with chronic  post infectious or inflammatory scarring. Linear scarring also noted  in the basal segments of the left lower lobe. Trace bilateral  pleural effusions lying dependently.  Musculoskeletal/Soft Tissues: There are no aggressive appearing  lytic or blastic lesions noted in the visualized portions of the  skeleton. Median sternotomy wires.  CTA ABDOMEN AND PELVIS FINDINGS  Hepatobiliary: No suspicious cystic or solid hepatic lesions. No  intra or extrahepatic biliary ductal dilatation. Gallbladder is  unremarkable in appearance.  Pancreas: No pancreatic mass. No pancreatic ductal dilatation. No  pancreatic or peripancreatic fluid collections or inflammatory  changes.  Spleen: Unremarkable.  Adrenals/Urinary Tract: Bilateral kidneys and adrenal glands are  unremarkable in appearance. No hydroureteronephrosis. Urinary  bladder is unremarkable in appearance.  Stomach/Bowel: Normal appearance of the stomach. No pathologic  dilatation of small bowel or colon. The appendix is not confidently  identified and may be surgically absent. Regardless, there are no  inflammatory changes noted adjacent to the cecum to suggest the  presence of an acute appendicitis at this time.  Vascular/Lymphatic: Aortic atherosclerosis with vascular findings  and measurements pertinent to potential TAVR procedure, as detailed  below. No aneurysm or dissection noted in the abdominal or pelvic  vasculature. No lymphadenopathy noted in the abdomen or pelvis.  Reproductive: Uterus and ovaries are atrophic. In the uterus there  is a 2.4 cm densely calcified lesion (axial image 179 of  series 15),  presumably a calcified fibroid.  Other: No significant volume of ascites. No pneumoperitoneum.  Musculoskeletal: Chronic appearing compression fracture of L2 with  30% loss of anterior vertebral body height. There are no aggressive  appearing lytic or blastic lesions noted in the visualized portions  of the skeleton.  VASCULAR MEASUREMENTS PERTINENT TO TAVR:  AORTA:  Minimal Aortic Diameter-9 x 11 mm  Severity of Aortic Calcification-moderate  RIGHT PELVIS:  Right Common Iliac Artery -  Minimal Diameter-6.3 x 3.7 mm  Tortuosity - mild  Calcification-moderate to severe  Right External Iliac Artery -  Minimal Diameter-5.6 x 5.8 mm  Tortuosity - mild  Calcification-mild  Right  Common Femoral Artery -  Minimal Diameter-5.0 x 3.2 mm  Tortuosity - mild  Calcification-moderate  LEFT PELVIS:  Left Common Iliac Artery -  Minimal Diameter-5.8 x 6.7 mm  Tortuosity - mild  Calcification-moderate  Left External Iliac Artery -  Minimal Diameter-6.7 x 6.1 mm  Tortuosity - mild  Calcification-mild  Left Common Femoral Artery -  Minimal Diameter-5.6 x 5.0 mm  Tortuosity-mild  Calcification-mild to moderate  Review of the MIP images confirms the above findings.  IMPRESSION:  1. Vascular findings and measurements pertinent to potential TAVR  procedure, as detailed above.  2. Severe thickening calcification of the aortic valve, compatible  with the reported clinical history of severe aortic stenosis.  3. 1.0 x 0.7 cm high attenuation structure in the mid esophagus  which may represent a large esophageal varix, or could simply  represent a duplication cyst containing proteinaceous or calcific  content.  4. Small pulmonary nodules in the lungs bilaterally measuring 5 mm  or less in size, nonspecific, but statistically likely benign. No  follow-up needed if patient is low-risk (and has no known or  suspected primary neoplasm). Non-contrast chest CT can be considered  in 12  months if patient is high-risk. This recommendation follows  the consensus statement: Guidelines for Management of Incidental  Pulmonary Nodules Detected on CT Images: From the Fleischner Society  2017; Radiology 2017; 284:228-243.  5. Cardiomegaly with biatrial dilatation (right greater than left).  6. Aortic atherosclerosis, in addition to left main and 2 vessel  coronary artery disease. Status post median sternotomy for CABG  including LIMA to the LAD.  7. Additional incidental findings, as above.      History of Present Illness     Nancy Mcguire is a 86 y.o. female with a hx of HTN, HLD, CAD s/p remote CABG x3, CKD stage IIIA, DMT1 on an insulin pump(diagnosed in her 30s), pulmonary HTNand severe aortic stenosis who presented to [facility] on 09/05/2019 with worsening shortness of breath.   Ms. Christiana lives with her husband in [place], [place]. She has three daughters, one who lives locally. Shefunctions independently and stays very busy. She no longer drives, but goes to the grocery store and takes care of all of her own ADLs. She used to use her Rollator to walk around the neighborhood for exercise but has not been able to do that for several months given worsening shortness of breath and fatigue. She has also limited by back pain. Of note, she regularly sees the dentist and has had no current dental issues.  She underwent CABG x3 in [place] 26years ago and has not had recurrent issues since. She and her husband moved from [place] to [place] 6 years ago. She was last seen by Dr. Allyson Sabal on 01/04/2019 and was completely asymptomatic at that time. [facility] [facility] [place] [facility] showed 1-39% stenosis bilaterally.  Echo 01/13/19 showed EF 60-65%, mild MR, mild-mod TR, severe AS with a mean gradient of 43 mm hg and moderate pulmonary HTN. It appears her severe AS was missed at this time. She was seen in our clinic by Edd Fabian NP on 08/09/19 for  evaluation of shortness of breath and chest pressure which was felt to be most consistent with chest wall pain. She was noted to have a systolic murmur at that time. Continued monitoring was recommended.   She [facility] to [facility] on 09/07[facility]2019 with worsening shortness of breath, DOE and chest pain.In the ER,CXRshowedcardiomegaly, elevation of the left hemidiaphragm, small  biilateral pleuraleffusions and or pleural thickening. CTA of chest negative forPE. There wasextensive atherosclerotic disease including coronary arteriesand small to moderatebilateralpleural effusions with compressiveatelectasis of lower lobes.HSTroponin 21; 26,BNP 1024,HGb 10.9,WBC 5.1 plts 219, Na 135, K+ 4.9, glucose 262, BUN 25 and Cr 0.99andCOVID neg. She was admitted for IV diuresis and further work up.  Hospital Course     Consultants: nephrology, CTCS  Repeat echo 08/30/19 showed EF 60-65%, mild LVH, G2DD, elevated LVEDP, mild RV dysfunction, severely elevated pulmonary artery pressure (82.2 mm Hg), mild biatrial enlargement, mild-mod MR, severe AS with a mean gradient of 51 mm Hg and trivial AI. She was diuersed with IV lasix with symptomatic improvement. The multidisciplinary valve team was consulted for consideration of TAVR and she was seen by Dr. Clifton James.  She underwent left and right heart catheterization on 5/20 which showed 2 out of 3 patent bypass grafts in a left dominant system.  The vein graft to the diagonal branch is occluded at the origin.  The LIMA to the LAD is patent with significant proximal LAD disease proximal to the LIMA insertion and the vein graft to the circumflex has moderate disease in the mid to distal shaft.  The RCA is nondominant.  She was found to have severe aortic stenosis with an AVA of 0.6cm2 and an LVEDP of 18.  On 5/21, she underwent cardiac gated CTA of the heart which revealed anatomical characteristics consistent with severe aortic stenosis suitable for treatment by  transcatheter aortic valve replacement without any significant complicating feature.  CTA of the aorta and iliac vessels demonstrate what appear to be adequate pelvic vascular access to facilitate a transfemoral approach (potentially on left side).  Unfortunately, the patient developed acute kidney injury with a creat peaking at 4.95 felt to mostly likely be related to contrast induced nephropathy with possible ARB effect. Losartan was discontinued on 5/22. She was treated with bicarb given metabolic acidosis from renal failure. Her creat has now improved to 2.88 and she has had increased urine output with 80 mg IV lasix. She is also growing gram negative rods in her urine and being treated for a UTI. Her creat trended downwards and plans were made to discharge and bring back for TAVR on 6/8. However, she then developed recurrent flash pulmonary edema requiring BIPAP and it was decided to keep her inpatient for TAVR on 6/1. This morning ( 6/1) she woke up complaining of chest pain and diaphoresis that self resolved. She was brought to the cath lab for TAVR. Pre op echo showed new severe LV dysfunction. Due to her critical AS, Dr. Laneta Simmers and Dr. Clifton James elected to perform balloon valvuloplasty prior to valve deployment as the valve could not even be easily crossed with the diagnostic catheters. There were two pacing runs for balloon valvuloplasty as the balloon moved forward on the first run. Following the second balloon valvuloplasty and pacing run, her blood pressure did not recover. The patient had requested prior to the case that her DNR status not be reversed for this case. She did not wish to have CPR or defibrillation if necessary. Per the patient's wishes, we did not attempt life saving maneuvers following balloon valvuloplasty. Time of death was 9:05am.   _____________  Discharge Vitals Blood pressure 115/71, pulse (!) 166, temperature 97.9 F (36.6 C), temperature source Oral, resp. rate 14, height  5\' 2"  (1.575 m), weight 49.7 kg, SpO2 (!) 0 %.  Filed Weights   09/12/19 0334 10/07/2019 0600 10/07/2019 0935  Weight: 49.7  kg 49.7 kg 49.7 kg    Labs & Radiologic Studies    CBC Recent Labs    09/11/19 0650 09/27/2019 0407  WBC 5.9 7.2  HGB 10.0* 10.1*  HCT 32.0* 33.0*  MCV 92.2 94.6  PLT 303 359   Basic Metabolic Panel Recent Labs    16/10/96 0430 09/14/2019 0407  NA 138 134*  K 5.1 5.0  CL 101 98  CO2 23 22  GLUCOSE 135* 232*  BUN 35* 43*  CREATININE 1.20* 1.66*  CALCIUM 8.4* 8.7*  MG 2.1  --    Liver Function Tests No results for input(s): AST, ALT, ALKPHOS, BILITOT, PROT, ALBUMIN in the last 72 hours. No results for input(s): LIPASE, AMYLASE in the last 72 hours. Cardiac Enzymes No results for input(s): CKTOTAL, CKMB, CKMBINDEX, TROPONINI in the last 72 hours. BNP Invalid input(s): POCBNP D-Dimer No results for input(s): DDIMER in the last 72 hours. Hemoglobin A1C No results for input(s): HGBA1C in the last 72 hours. Fasting Lipid Panel No results for input(s): CHOL, HDL, LDLCALC, TRIG, CHOLHDL, LDLDIRECT in the last 72 hours. Thyroid Function Tests No results for input(s): TSH, T4TOTAL, T3FREE, THYROIDAB in the last 72 hours.  Invalid input(s): FREET3 _____________  DG Chest 2 View  Result Date: 08/18/2019 CLINICAL DATA:  Chest pain, shortness of breath EXAM: CHEST - 2 VIEW COMPARISON:  None. FINDINGS: Cardiomegaly status post median sternotomy. Elevation of the left hemidiaphragm. Probable small bilateral pleural effusions and/or pleural thickening. Disc degenerative disease of the thoracic spine. IMPRESSION: 1. Cardiomegaly. 2. Elevation of the left hemidiaphragm. 3. Probable small bilateral pleural effusions and/or pleural thickening. Electronically Signed   By: Lauralyn Primes M.D.   On: 09/09/2019 13:07   CT Angio Chest PE W/Cm &/Or Wo Cm  Result Date: 09/06/2019 CLINICAL DATA:  Shortness of breath and chest pain for past month, pain with exertion EXAM: CT  ANGIOGRAPHY CHEST WITH CONTRAST TECHNIQUE: Multidetector CT imaging of the chest was performed using the standard protocol during bolus administration of intravenous contrast. Multiplanar CT image reconstructions and MIPs were obtained to evaluate the vascular anatomy. CONTRAST:  80mL OMNIPAQUE IOHEXOL 350 MG/ML SOLN IV COMPARISON:  None FINDINGS: Cardiovascular: Atherosclerotic calcifications aorta, proximal great vessels, coronary arteries. Postsurgical changes of CABG. Enlargement of cardiac chambers. No pericardial effusion. Aorta normal caliber. Pulmonary arteries adequately opacified and patent. No evidence of pulmonary embolism. Mediastinum/Nodes: Base of cervical region normal appearance. No thoracic adenopathy. Elevation of LEFT diaphragm. Esophagus unremarkable. Lungs/Pleura: BILATERAL small to moderate pleural effusions with compressive atelectasis of the posterior lower lobes. Calcified granulomata RIGHT upper lobe. No definite infiltrate or pneumothorax. Upper Abdomen: Reflux of contrast into IVC and hepatic veins question elevated RIGHT heart pressures. Remaining visualized upper abdomen unremarkable. Musculoskeletal: No acute osseous findings. Degenerative disc disease changes midthoracic spine. Review of the MIP images confirms the above findings. IMPRESSION: No evidence of pulmonary embolism. Extensive atherosclerotic disease including coronary arteries with postsurgical changes of CABG. Small to moderate BILATERAL pleural effusions with compressive atelectasis of lower lobes. Suspected elevated RIGHT heart pressures. Aortic Atherosclerosis (ICD10-I70.0). Electronically Signed   By: Ulyses Southward M.D.   On: 08/19/2019 17:38   CARDIAC CATHETERIZATION  Result Date: 09-17-19  Prox LAD to Mid LAD lesion is 90% stenosed.  Mid LAD-1 lesion is 90% stenosed.  Mid LAD-2 lesion is 90% stenosed.  Origin to Prox Graft lesion is 100% stenosed.  Ost Cx to Prox Cx lesion is 90% stenosed.  2nd Mrg lesion  is 90% stenosed.  Mid  Graft to Dist Graft lesion is 60% stenosed.  Prox Graft to Mid Graft lesion is 40% stenosed.  Hemodynamic findings consistent with aortic valve stenosis.  Nancy Mcguire is a 86 y.o. female  [id number] LOCATION:  FACILITY: [facility] PHYSICIAN: Nanetta Batty, M.D. 1929-07-20 DATE OF PROCEDURE:  08/14/2019 DATE OF DISCHARGE: CARDIAC CATHETERIZATION History obtained from chart review.86 y.o. female with a hx of HTN, CAD (CABG X3 26 yrs ago), DM-1(on insulin pump) and AS with echo 01/2019 with AVarea by VTI of 0.51who is being seen today for the evaluation of SOB and severe AS.   Ms. Bosket has 2 of 3 grafts patent in the left dominant system.  The vein graft to the diagonal branch is occluded at the origin.  The LIMA to the LAD is patent with significant proximal LAD disease proximal to LIMA insertion.  The vein graft to the circumflex has moderate disease in the mid and distal shaft.  The RCA is nondominant.  She has severe aortic stenosis with a valve area between 0.6 1.8 by Fick or thermal dilution and EDP of 18.  I did shoot her distal abdominal aorta revealing widely patent iliacs arteries.  I reviewed the films with Dr. Clifton James who feels that she is a acceptable candidate for TAVR.  I performed angiography of the right common femoral artery and and successfully deployed a MYNX closure device in the right common femoral artery and vein achieving hemostasis.  The patient left lab in stable condition. Nanetta Batty. MD, [facility] 08/13/2019 6:04 PM   US RENAL  Result Date: 09/03/2019 CLINICAL DATA:  86 year female with acute kidney injury EXAM: RENAL / URINARY TRACT ULTRASOUND COMPLETE COMPARISON:  None. FINDINGS: Right Kidney: Length: 11.3 cm x 4.2 cm x 4.7 cm, 97 cc. Echogenicity within normal limits. No mass or hydronephrosis visualized. Left Kidney: Length: 9.0 cm x 5.0 cm x 4.3 cm 102 cc. Echogenicity within normal limits. No mass or hydronephrosis visualized. Bladder: Appears  normal for degree of bladder distention. IMPRESSION: Negative for hydronephrosis of the left or right kidney. Electronically Signed   By: Gilmer Mor D.O.   On: 09/03/2019 10:49   CT CORONARY MORPH W/CTA COR W/SCORE W/CA W/CM &/OR WO/CM  Addendum Date: 09/05/2019   ADDENDUM REPORT: 09/05/2019 11:33 ADDENDUM: Extracardiac findings are described separately under dictation for contemporaneously obtained CTA chest, abdomen and pelvis 07/03/2019. Please see that dictation for full description of relevant extracardiac findings. Electronically Signed   By: Trudie Reed M.D.   On: 09/05/2019 11:33   Result Date: 09/05/2019 CLINICAL DATA:  Aortic Stenosis EXAM: Cardiac TAVR CT TECHNIQUE: The patient was scanned on a Siemens Force 192 slice scanner. A 120 kV retrospective scan was triggered in the ascending thoracic aorta at 140 HU's. Gantry rotation speed was 250 msecs and collimation was .6 mm. No beta blockade or nitro were given. The 3D data set was reconstructed in 5% intervals of the R-R cycle. Systolic and diastolic phases were analyzed on a dedicated work station using MPR, MIP and VRT modes. The patient received 80 cc of contrast. FINDINGS: Aortic Valve: Tri leaflet calcified with restricted leaflet motion Aorta: No aneurysm normal arch vessels moderate calcific atherosclerosis Sino-tubular Junction: 23 mm Ascending Thoracic Aorta: 26 mm Aortic Arch: 26 mm Descending Thoracic Aorta: 19 mm Sinus of Valsalva Measurements: Non-coronary: 29 mm Right - coronary: 28.4 mm Left -   coronary: 28.3 mm Coronary Artery Height above Annulus: Left Main: 13.1 mm above annulus Right Coronary: 14.4 mm above annulus  Virtual Basal Annulus Measurements: Maximum / Minimum Diameter: 25.6 mm x 20.65 mm Perimeter: 72.4 mm Area: 388 mm2 Coronary Arteries: Sufficient height above annulus for deployment Patent SVG to OM and Patent LIMA to LAD Optimum Fluoroscopic Angle for Delivery: LAO 14 Caudal 14 degrees IMPRESSION: 1. Tri  leaflet AV with annular area of 388 mm2 suitable for a 23 mm Sapien 3 valve 2. Coronary arteries suitable height above annulus for deployment Patent SVG to OM and LIMA to LAD 3.  Normal ascending aortic root diameter 2.6 cm 4. Optimum angiographic angle for deployment LAO 14 Caudal 14 degrees Charlton Haws Electronically Signed: By: Charlton Haws M.D. On: 09/02/2019 17:15   DG CHEST PORT 1 VIEW  Result Date: 09/08/2019 CLINICAL DATA:  Shortness of breath. EXAM: PORTABLE CHEST 1 VIEW COMPARISON:  09/07/2019 FINDINGS: The heart is mildly enlarged. The mediastinal and hilar contours are within normal limits and stable. There is moderate central vascular congestion and mild interstitial edema. Stable moderate eventration of the left hemidiaphragm with overlying vascular crowding and atelectasis. No pneumothorax. IMPRESSION: Persistent changes of CHF. Electronically Signed   By: Rudie Meyer M.D.   On: 09/08/2019 08:10   DG Chest Port 1 View  Result Date: 09/07/2019 CLINICAL DATA:  Acute respiratory distress EXAM: PORTABLE CHEST 1 VIEW COMPARISON:  CT 09-09-19 FINDINGS: There is chronic elevation of the left hemidiaphragm likely with adjacent atelectatic changes. There are diffusely increasing hazy and reticular opacities throughout both lungs. Suspect small effusions well. The pulmonary vascularity is indistinct. Cardiomediastinal contours are similar to prior with evidence of prior CABG including mediastinal surgical clips and sternotomy sutures. Atherosclerotic calcification of the thoracic aorta. No acute osseous or soft tissue abnormality. Degenerative changes are present in the imaged spine and shoulders. Telemetry leads overlie the chest. IMPRESSION: 1. Diffuse bilateral hazy and reticular opacities throughout both lungs with some indistinctness of the pulmonary vascularity. Findings are suggestive of worsening CHF/volume overload with edema. Trace effusions are likely present as well. 2. Chronic  elevation of the left hemidiaphragm likely with adjacent atelectatic changes. Electronically Signed   By: Kreg Shropshire M.D.   On: 09/07/2019 01:31   CT ANGIO CHEST AORTA W/CM & OR WO/CM  Result Date: 09/05/2019 CLINICAL DATA:  86 year old female with history of severe aortic stenosis. Preprocedural study prior to potential transcatheter aortic valve replacement (TAVR). EXAM: CT ANGIOGRAPHY CHEST, ABDOMEN AND PELVIS TECHNIQUE: Non-contrast CT of the chest was initially obtained. Multidetector CT imaging through the chest, abdomen and pelvis was performed using the standard protocol during bolus administration of intravenous contrast. Multiplanar reconstructed images and MIPs were obtained and reviewed to evaluate the vascular anatomy. CONTRAST:  80mL OMNIPAQUE IOHEXOL 350 MG/ML SOLN COMPARISON:  No priors. FINDINGS: CTA CHEST FINDINGS Cardiovascular: Heart size is enlarged with biatrial dilatation (right greater than left). There is no significant pericardial fluid, thickening or pericardial calcification. There is aortic atherosclerosis, as well as atherosclerosis of the great vessels of the mediastinum and the coronary arteries, including calcified atherosclerotic plaque in the left main, left anterior descending and left circumflex coronary arteries. Status post median sternotomy for CABG including LIMA to the LAD. Severe thickening calcification of the aortic valve. Moderate to severe calcifications of the mitral annulus, particularly inferiorly. Mediastinum/Lymph Nodes: No pathologically enlarged mediastinal or hilar lymph nodes. In the mid esophagus (axial image 75 of series 15) there is a well-defined 1.0 x 0.7 cm high attenuation structure, which could represent an esophageal varix, or could represent a duplication cyst containing proteinaceous/calcific  content. No axillary lymphadenopathy. Lungs/Pleura: A few scattered small pulmonary nodules are noted, largest of which measures only 5 mm in the  posterior aspect of the right upper lobe (axial image 32 of series 16). No other larger more suspicious appearing pulmonary nodules or masses are noted. Bilateral apical pleuroparenchymal thickening and architectural distortion (left greater than right), most compatible with chronic post infectious or inflammatory scarring. Linear scarring also noted in the basal segments of the left lower lobe. Trace bilateral pleural effusions lying dependently. Musculoskeletal/Soft Tissues: There are no aggressive appearing lytic or blastic lesions noted in the visualized portions of the skeleton. Median sternotomy wires. CTA ABDOMEN AND PELVIS FINDINGS Hepatobiliary: No suspicious cystic or solid hepatic lesions. No intra or extrahepatic biliary ductal dilatation. Gallbladder is unremarkable in appearance. Pancreas: No pancreatic mass. No pancreatic ductal dilatation. No pancreatic or peripancreatic fluid collections or inflammatory changes. Spleen: Unremarkable. Adrenals/Urinary Tract: Bilateral kidneys and adrenal glands are unremarkable in appearance. No hydroureteronephrosis. Urinary bladder is unremarkable in appearance. Stomach/Bowel: Normal appearance of the stomach. No pathologic dilatation of small bowel or colon. The appendix is not confidently identified and may be surgically absent. Regardless, there are no inflammatory changes noted adjacent to the cecum to suggest the presence of an acute appendicitis at this time. Vascular/Lymphatic: Aortic atherosclerosis with vascular findings and measurements pertinent to potential TAVR procedure, as detailed below. No aneurysm or dissection noted in the abdominal or pelvic vasculature. No lymphadenopathy noted in the abdomen or pelvis. Reproductive: Uterus and ovaries are atrophic. In the uterus there is a 2.4 cm densely calcified lesion (axial image 179 of series 15), presumably a calcified fibroid. Other: No significant volume of ascites.  No pneumoperitoneum.  Musculoskeletal: Chronic appearing compression fracture of L2 with 30% loss of anterior vertebral body height. There are no aggressive appearing lytic or blastic lesions noted in the visualized portions of the skeleton. VASCULAR MEASUREMENTS PERTINENT TO TAVR: AORTA: Minimal Aortic Diameter-9 x 11 mm Severity of Aortic Calcification-moderate RIGHT PELVIS: Right Common Iliac Artery - Minimal Diameter-6.3 x 3.7 mm Tortuosity - mild Calcification-moderate to severe Right External Iliac Artery - Minimal Diameter-5.6 x 5.8 mm Tortuosity - mild Calcification-mild Right Common Femoral Artery - Minimal Diameter-5.0 x 3.2 mm Tortuosity - mild Calcification-moderate LEFT PELVIS: Left Common Iliac Artery - Minimal Diameter-5.8 x 6.7 mm Tortuosity - mild Calcification-moderate Left External Iliac Artery - Minimal Diameter-6.7 x 6.1 mm Tortuosity - mild Calcification-mild Left Common Femoral Artery - Minimal Diameter-5.6 x 5.0 mm Tortuosity-mild Calcification-mild to moderate Review of the MIP images confirms the above findings. IMPRESSION: 1. Vascular findings and measurements pertinent to potential TAVR procedure, as detailed above. 2. Severe thickening calcification of the aortic valve, compatible with the reported clinical history of severe aortic stenosis. 3. 1.0 x 0.7 cm high attenuation structure in the mid esophagus which may represent a large esophageal varix, or could simply represent a duplication cyst containing proteinaceous or calcific content. 4. Small pulmonary nodules in the lungs bilaterally measuring 5 mm or less in size, nonspecific, but statistically likely benign. No follow-up needed if patient is low-risk (and has no known or suspected primary neoplasm). Non-contrast chest CT can be considered in 12 months if patient is high-risk. This recommendation follows the consensus statement: Guidelines for Management of Incidental Pulmonary Nodules Detected on CT Images: From the Fleischner Society 2017; Radiology  2017; 284:228-243. 5. Cardiomegaly with biatrial dilatation (right greater than left). 6. Aortic atherosclerosis, in addition to left main and 2 vessel coronary artery disease. Status post  median sternotomy for CABG including LIMA to the LAD. 7. Additional incidental findings, as above. Electronically Signed   By: Trudie Reed M.D.   On: 09/05/2019 12:37   ECHOCARDIOGRAM COMPLETE  Result Date: 08/30/2019    ECHOCARDIOGRAM REPORT   Patient Name:   Nancy Mcguire Date of Exam: 08/30/2019 Medical Rec #:  [id number]     Height:       62.0 in Accession #:    [id number]    Weight:       120.0 lb Date of Birth:  [birth year]     BSA:          1.539 m Patient Age:    86 years      BP:           174/77 mmHg Patient Gender: F             HR:           75 bpm. Exam Location:  Inpatient Procedure: 2D Echo, Cardiac Doppler and Color Doppler Indications:    Dyspnea  History:        Patient has prior history of Echocardiogram examinations, most                 recent 01/13/2019. CAD, Prior CABG, Carotid Disease, Aortic Valve                 Disease, Signs/Symptoms:Shortness of Breath and Murmur; Risk                 Factors:Hypertension, Dyslipidemia and Diabetes.  Sonographer:    Lavenia Atlas Referring Phys: [id number] EMILY B MULLEN IMPRESSIONS  1. Left ventricular ejection fraction, by estimation, is 60 to 65%. The left ventricle has normal function. The left ventricle has no regional wall motion abnormalities. There is mild left ventricular hypertrophy. Left ventricular diastolic parameters are consistent with Grade II diastolic dysfunction (pseudonormalization). Elevated left ventricular end-diastolic pressure.  2. Right ventricular systolic function is mildly reduced. The right ventricular size is normal. There is severely elevated pulmonary artery systolic pressure. The estimated right ventricular systolic pressure is 82.2 mmHg.  3. Left atrial size was mildly dilated.  4. Right atrial size was mildly dilated.  5. The  mitral valve is abnormal. Mild to moderate mitral valve regurgitation.  6. Tricuspid valve regurgitation is mild to moderate.  7. The aortic valve is tricuspid. Aortic valve regurgitation is trivial. Severe aortic valve stenosis. Aortic valve area, by VTI measures 0.36 cm. Aortic valve mean gradient measures 51.0 mmHg. Aortic valve Vmax measures 4.68 m/s.  8. The inferior vena cava is dilated in size with <50% respiratory variability, suggesting right atrial pressure of 15 mmHg. Comparison(s): Changes from prior study are noted. 01/13/2019: LVEF 60-65%, severe AS - mean gradient 43 mmHg. FINDINGS  Left Ventricle: Left ventricular ejection fraction, by estimation, is 60 to 65%. The left ventricle has normal function. The left ventricle has no regional wall motion abnormalities. The left ventricular internal cavity size was normal in size. There is  mild left ventricular hypertrophy. Left ventricular diastolic parameters are consistent with Grade II diastolic dysfunction (pseudonormalization). Elevated left ventricular end-diastolic pressure. Right Ventricle: The right ventricular size is normal. No increase in right ventricular wall thickness. Right ventricular systolic function is mildly reduced. There is severely elevated pulmonary artery systolic pressure. The tricuspid regurgitant velocity is 4.10 m/s, and with an assumed right atrial pressure of 15 mmHg, the estimated right ventricular systolic pressure is 82.2 mmHg. Left Atrium: Left atrial size  was mildly dilated. Right Atrium: Right atrial size was mildly dilated. Pericardium: There is no evidence of pericardial effusion. Mitral Valve: The mitral valve is abnormal. There is moderate thickening of the mitral valve leaflet(s). There is moderate calcification of the posterior mitral valve leaflet(s). Moderate mitral annular calcification. Mild to moderate mitral valve regurgitation. Tricuspid Valve: The tricuspid valve is grossly normal. Tricuspid valve  regurgitation is mild to moderate. Aortic Valve: The aortic valve is tricuspid. Aortic valve regurgitation is trivial. Severe aortic stenosis is present. Aortic valve mean gradient measures 51.0 mmHg. Aortic valve peak gradient measures 87.6 mmHg. Aortic valve area, by VTI measures 0.36 cm. Pulmonic Valve: The pulmonic valve was normal in structure. Pulmonic valve regurgitation is not visualized. Aorta: The aortic root and ascending aorta are structurally normal, with no evidence of dilitation. Venous: The inferior vena cava is dilated in size with less than 50% respiratory variability, suggesting right atrial pressure of 15 mmHg. IAS/Shunts: No atrial level shunt detected by color flow Doppler.  LEFT VENTRICLE PLAX 2D LVIDd:         4.20 cm  Diastology LVIDs:         3.10 cm  LV e' lateral:   6.76 cm/s LV PW:         1.10 cm  LV E/e' lateral: 18.9 LV IVS:        1.10 cm  LV e' medial:    4.04 cm/s LVOT diam:     1.80 cm  LV E/e' medial:  31.7 LV SV:         41 LV SV Index:   27 LVOT Area:     2.54 cm  RIGHT VENTRICLE RV Basal diam:  2.80 cm RV S prime:     6.10 cm/s TAPSE (M-mode): 2.3 cm LEFT ATRIUM             Index       RIGHT ATRIUM           Index LA diam:        4.50 cm 2.92 cm/m  RA Area:     18.80 cm LA Vol (A2C):   52.2 ml 33.93 ml/m RA Volume:   54.70 ml  35.55 ml/m LA Vol (A4C):   45.5 ml 29.57 ml/m LA Biplane Vol: 48.4 ml 31.46 ml/m  AORTIC VALVE AV Area (Vmax):    0.39 cm AV Area (Vmean):   0.34 cm AV Area (VTI):     0.36 cm AV Vmax:           468.00 cm/s AV Vmean:          317.333 cm/s AV VTI:            1.147 m AV Peak Grad:      87.6 mmHg AV Mean Grad:      51.0 mmHg LVOT Vmax:         71.70 cm/s LVOT Vmean:        42.800 cm/s LVOT VTI:          0.163 m LVOT/AV VTI ratio: 0.14  AORTA Ao Root diam: 2.60 cm MITRAL VALVE                TRICUSPID VALVE MV Area (PHT): 5.20 cm     TR Peak grad:   67.2 mmHg MV Decel Time: 146 msec     TR Vmax:        410.00 cm/s MV E velocity: 128.00 cm/s MV A  velocity: 111.00 cm/s  SHUNTS MV E/A ratio:  1.15         Systemic VTI:  0.16 m                             Systemic Diam: 1.80 cm Zoila Shutter MD Electronically signed by Zoila Shutter MD Signature Date/Time: 08/30/2019/9:49:14 AM    Final    VAS US CAROTID  Result Date: 09/03/2019 Carotid Arterial Duplex Study Indications:      Pre-Op TAVR. Risk Factors:     Hypertension, hyperlipidemia, Diabetes, coronary artery                   disease. Other Factors:    Aortic valve disease, CKD III. Comparison Study: No prior study on file Performing Technologist: Sherren Kerns RVS  Examination Guidelines: A complete evaluation includes B-mode imaging, spectral Doppler, color Doppler, and power Doppler as needed of all accessible portions of each vessel. Bilateral testing is considered an integral part of a complete examination. Limited examinations for reoccurring indications may be performed as noted.  Right Carotid Findings: +----------+--------+--------+--------+------------------+------------------+           PSV cm/sEDV cm/sStenosisPlaque DescriptionComments           +----------+--------+--------+--------+------------------+------------------+ CCA Prox  57      7                                                    +----------+--------+--------+--------+------------------+------------------+ CCA Distal59      13                                intimal thickening +----------+--------+--------+--------+------------------+------------------+ ICA Prox  53      14              calcific                             +----------+--------+--------+--------+------------------+------------------+ ICA Distal53      12                                                   +----------+--------+--------+--------+------------------+------------------+ ECA       49      2                                                     +----------+--------+--------+--------+------------------+------------------+ +----------+--------+-------+--------+-------------------+           PSV cm/sEDV cmsDescribeArm Pressure (mmHG) +----------+--------+-------+--------+-------------------+ [id number]                                         +----------+--------+-------+--------+-------------------+ +---------+--------+--+--------+--+ VertebralPSV cm/s54EDV cm/s12 +---------+--------+--+--------+--+  Left Carotid Findings: +----------+--------+--------+--------+------------------+------------------+           PSV cm/sEDV cm/sStenosisPlaque DescriptionComments           +----------+--------+--------+--------+------------------+------------------+ CCA Prox  60  13                                intimal thickening +----------+--------+--------+--------+------------------+------------------+ CCA Distal53      6                                 intimal thickening +----------+--------+--------+--------+------------------+------------------+ ICA Prox  57      15              calcific                             +----------+--------+--------+--------+------------------+------------------+ ICA Distal67      24                                                   +----------+--------+--------+--------+------------------+------------------+ ECA       81      15                                                   +----------+--------+--------+--------+------------------+------------------+ +----------+--------+--------+--------+-------------------+           PSV cm/sEDV cm/sDescribeArm Pressure (mmHG) +----------+--------+--------+--------+-------------------+ Subclavian100                                         +----------+--------+--------+--------+-------------------+ +---------+--------+--+--------+--+ VertebralPSV cm/s66EDV cm/s13 +---------+--------+--+--------+--+   Summary: Right  Carotid: Velocities in the right ICA are consistent with a 1-39% stenosis. Left Carotid: Velocities in the left ICA are consistent with a 1-39% stenosis. Vertebrals:  Bilateral vertebral arteries demonstrate antegrade flow. Subclavians: Normal flow hemodynamics were seen in bilateral subclavian              arteries. *See table(s) above for measurements and observations.  Electronically signed by Lemar Livings MD on 09/03/2019 at 10:42:53 AM.    Final    ECHOCARDIOGRAM LIMITED  Result Date: 09/14/2019    ECHOCARDIOGRAM LIMITED REPORT   Patient Name:   Nancy Mcguire Date of Exam: 10/03/2019 Medical Rec #:  [id number]     Height:       62.0 in Accession #:    [id number]    Weight:       109.6 lb Date of Birth:  Jul 24, 1929     BSA:          1.480 m Patient Age:    86 years      BP:           115/71 mmHg Patient Gender: F             HR:           115 bpm. Exam Location:  Inpatient Procedure: Limited Echo, Limited Color Doppler, Cardiac Doppler and Echo            Assisted Procedure Indications:     Aortic Stenosis; TAVR Procedure  History:         Patient has prior history of Echocardiogram examinations, most  recent 08/30/2019. CAD, Prior CABG; Risk Factors:Hypertension,                  Diabetes and Dyslipidemia.  Sonographer:     Thurman Coyer RDCS (AE) Referring Phys:  [id number] Janetta Hora Diagnosing Phys: Tobias Alexander MD IMPRESSIONS  1. This was a periprocedural echocardiogram during a TAVR procedure. The native aortic valve had critical aortic stenosis with peak/mean gradient 56/33 mmHg and dimensionless index 0.11 (low flow/low gradient critical aortic stenosis). When compared to the prior study from 08/30/2019 LVEF has dramatically decreased from 60-65% and no wall motion abnormalities to 20-25% with akinesis in the LAD territory. Because of critical AS a balloon valvuloplasty was performed. The patient was running VTs/VF post annuloplasty and didn't recover the BP. She was DNR and  didn't wish to be resuscitated. She was pronounced at 9:05 am. An echocardiogram showed non moving left ventricle with no pericardial effusion.  2. Left ventricular ejection fraction, by estimation, is 20 to 25%. The left ventricle has severely decreased function. The left ventricular internal cavity size was mildly dilated.  3. Mild mitral valve regurgitation.  4. Tricuspid valve regurgitation is moderate.  5. Severe aortic valve stenosis. Aortic valve mean gradient measures 33.0 mmHg.  6. There is moderately elevated pulmonary artery systolic pressure. The estimated right ventricular systolic pressure is 56.2 mmHg. FINDINGS  Left Ventricle: Left ventricular ejection fraction, by estimation, is 20 to 25%. The left ventricle has severely decreased function. The left ventricular internal cavity size was mildly dilated.  LV Wall Scoring: The mid and distal anterior septum, mid inferoseptal segment, and apex are dyskinetic. The entire anterior wall, basal anteroseptal segment, apical lateral segment, mid anterolateral segment, and apical inferior segment are akinetic. The basal inferoseptal segment is hypokinetic. The inferior wall, posterior wall, and basal anterolateral segment are normal. Right Ventricle: There is moderately elevated pulmonary artery systolic pressure. The tricuspid regurgitant velocity is 3.47 m/s, and with an assumed right atrial pressure of 8 mmHg, the estimated right ventricular systolic pressure is 56.2 mmHg. Mitral Valve: Mild mitral valve regurgitation. Tricuspid Valve: Tricuspid valve regurgitation is moderate. Aortic Valve: Severe aortic stenosis is present. There is severe thickening of the aortic valve. There is severe calcifcation of the aortic valve. Aortic valve mean gradient measures 33.0 mmHg. Aortic valve peak gradient measures 53.4 mmHg. Aortic valve area, by VTI measures 0.41 cm.  LEFT VENTRICLE PLAX 2D LVOT diam:     2.00 cm LV SV:         29 LV SV Index:   20 LVOT Area:      3.14 cm  AORTIC VALVE AV Area (Vmax):    0.36 cm AV Area (Vmean):   0.35 cm AV Area (VTI):     0.41 cm AV Vmax:           365.50 cm/s AV Vmean:          267.000 cm/s AV VTI:            0.712 m AV Peak Grad:      53.4 mmHg AV Mean Grad:      33.0 mmHg LVOT Vmax:         42.40 cm/s LVOT Vmean:        29.600 cm/s LVOT VTI:          0.094 m LVOT/AV VTI ratio: 0.13 TRICUSPID VALVE TR Peak grad:   48.2 mmHg TR Vmax:        347.00 cm/s  SHUNTS Systemic  VTI:  0.09 m Systemic Diam: 2.00 cm Tobias Alexander MD Electronically signed by Tobias Alexander MD Signature Date/Time: 09/28/2019/10:34:40 AM    Final    Structural Heart Procedure  Result Date: 10/12/2019 Pt expired during the TAVR procedure following balloon valvuloplasty. Please see full operative note in the Notes section.   CT Angio Abd/Pel w/ and/or w/o  Result Date: 09/05/2019 CLINICAL DATA:  86 year old female with history of severe aortic stenosis. Preprocedural study prior to potential transcatheter aortic valve replacement (TAVR). EXAM: CT ANGIOGRAPHY CHEST, ABDOMEN AND PELVIS TECHNIQUE: Non-contrast CT of the chest was initially obtained. Multidetector CT imaging through the chest, abdomen and pelvis was performed using the standard protocol during bolus administration of intravenous contrast. Multiplanar reconstructed images and MIPs were obtained and reviewed to evaluate the vascular anatomy. CONTRAST:  72mL OMNIPAQUE IOHEXOL 350 MG/ML SOLN COMPARISON:  No priors. FINDINGS: CTA CHEST FINDINGS Cardiovascular: Heart size is enlarged with biatrial dilatation (right greater than left). There is no significant pericardial fluid, thickening or pericardial calcification. There is aortic atherosclerosis, as well as atherosclerosis of the great vessels of the mediastinum and the coronary arteries, including calcified atherosclerotic plaque in the left main, left anterior descending and left circumflex coronary arteries. Status post median sternotomy for CABG  including LIMA to the LAD. Severe thickening calcification of the aortic valve. Moderate to severe calcifications of the mitral annulus, particularly inferiorly. Mediastinum/Lymph Nodes: No pathologically enlarged mediastinal or hilar lymph nodes. In the mid esophagus (axial image 75 of series 15) there is a well-defined 1.0 x 0.7 cm high attenuation structure, which could represent an esophageal varix, or could represent a duplication cyst containing proteinaceous/calcific content. No axillary lymphadenopathy. Lungs/Pleura: A few scattered small pulmonary nodules are noted, largest of which measures only 5 mm in the posterior aspect of the right upper lobe (axial image 32 of series 16). No other larger more suspicious appearing pulmonary nodules or masses are noted. Bilateral apical pleuroparenchymal thickening and architectural distortion (left greater than right), most compatible with chronic post infectious or inflammatory scarring. Linear scarring also noted in the basal segments of the left lower lobe. Trace bilateral pleural effusions lying dependently. Musculoskeletal/Soft Tissues: There are no aggressive appearing lytic or blastic lesions noted in the visualized portions of the skeleton. Median sternotomy wires. CTA ABDOMEN AND PELVIS FINDINGS Hepatobiliary: No suspicious cystic or solid hepatic lesions. No intra or extrahepatic biliary ductal dilatation. Gallbladder is unremarkable in appearance. Pancreas: No pancreatic mass. No pancreatic ductal dilatation. No pancreatic or peripancreatic fluid collections or inflammatory changes. Spleen: Unremarkable. Adrenals/Urinary Tract: Bilateral kidneys and adrenal glands are unremarkable in appearance. No hydroureteronephrosis. Urinary bladder is unremarkable in appearance. Stomach/Bowel: Normal appearance of the stomach. No pathologic dilatation of small bowel or colon. The appendix is not confidently identified and may be surgically absent. Regardless, there are  no inflammatory changes noted adjacent to the cecum to suggest the presence of an acute appendicitis at this time. Vascular/Lymphatic: Aortic atherosclerosis with vascular findings and measurements pertinent to potential TAVR procedure, as detailed below. No aneurysm or dissection noted in the abdominal or pelvic vasculature. No lymphadenopathy noted in the abdomen or pelvis. Reproductive: Uterus and ovaries are atrophic. In the uterus there is a 2.4 cm densely calcified lesion (axial image 179 of series 15), presumably a calcified fibroid. Other: No significant volume of ascites.  No pneumoperitoneum. Musculoskeletal: Chronic appearing compression fracture of L2 with 30% loss of anterior vertebral body height. There are no aggressive appearing lytic or blastic lesions noted in  the visualized portions of the skeleton. VASCULAR MEASUREMENTS PERTINENT TO TAVR: AORTA: Minimal Aortic Diameter-9 x 11 mm Severity of Aortic Calcification-moderate RIGHT PELVIS: Right Common Iliac Artery - Minimal Diameter-6.3 x 3.7 mm Tortuosity - mild Calcification-moderate to severe Right External Iliac Artery - Minimal Diameter-5.6 x 5.8 mm Tortuosity - mild Calcification-mild Right Common Femoral Artery - Minimal Diameter-5.0 x 3.2 mm Tortuosity - mild Calcification-moderate LEFT PELVIS: Left Common Iliac Artery - Minimal Diameter-5.8 x 6.7 mm Tortuosity - mild Calcification-moderate Left External Iliac Artery - Minimal Diameter-6.7 x 6.1 mm Tortuosity - mild Calcification-mild Left Common Femoral Artery - Minimal Diameter-5.6 x 5.0 mm Tortuosity-mild Calcification-mild to moderate Review of the MIP images confirms the above findings. IMPRESSION: 1. Vascular findings and measurements pertinent to potential TAVR procedure, as detailed above. 2. Severe thickening calcification of the aortic valve, compatible with the reported clinical history of severe aortic stenosis. 3. 1.0 x 0.7 cm high attenuation structure in the mid esophagus which  may represent a large esophageal varix, or could simply represent a duplication cyst containing proteinaceous or calcific content. 4. Small pulmonary nodules in the lungs bilaterally measuring 5 mm or less in size, nonspecific, but statistically likely benign. No follow-up needed if patient is low-risk (and has no known or suspected primary neoplasm). Non-contrast chest CT can be considered in 12 months if patient is high-risk. This recommendation follows the consensus statement: Guidelines for Management of Incidental Pulmonary Nodules Detected on CT Images: From the Fleischner Society 2017; Radiology 2017; 284:228-243. 5. Cardiomegaly with biatrial dilatation (right greater than left). 6. Aortic atherosclerosis, in addition to left main and 2 vessel coronary artery disease. Status post median sternotomy for CABG including LIMA to the LAD. 7. Additional incidental findings, as above. Electronically Signed   By: Trudie Reed M.D.   On: 09/05/2019 12:37   Disposition   Patient passed aware at 9:05 am on the cath lab table.    Duration of Discharge Encounter   Greater than 30 minutes including physician time.  SignedCline Crock, PA-C 10/01/2019, 11:09 AM [phone]

## 2019-10-13 DEATH — deceased

## 2020-08-10 IMAGING — DX DG CHEST 2V
2 series · 2 of 2 positions shown · non-contrast
Comparison: None.

CLINICAL DATA: Chest pain, shortness of breath

EXAM:
CHEST - 2 VIEW

[chest pa]
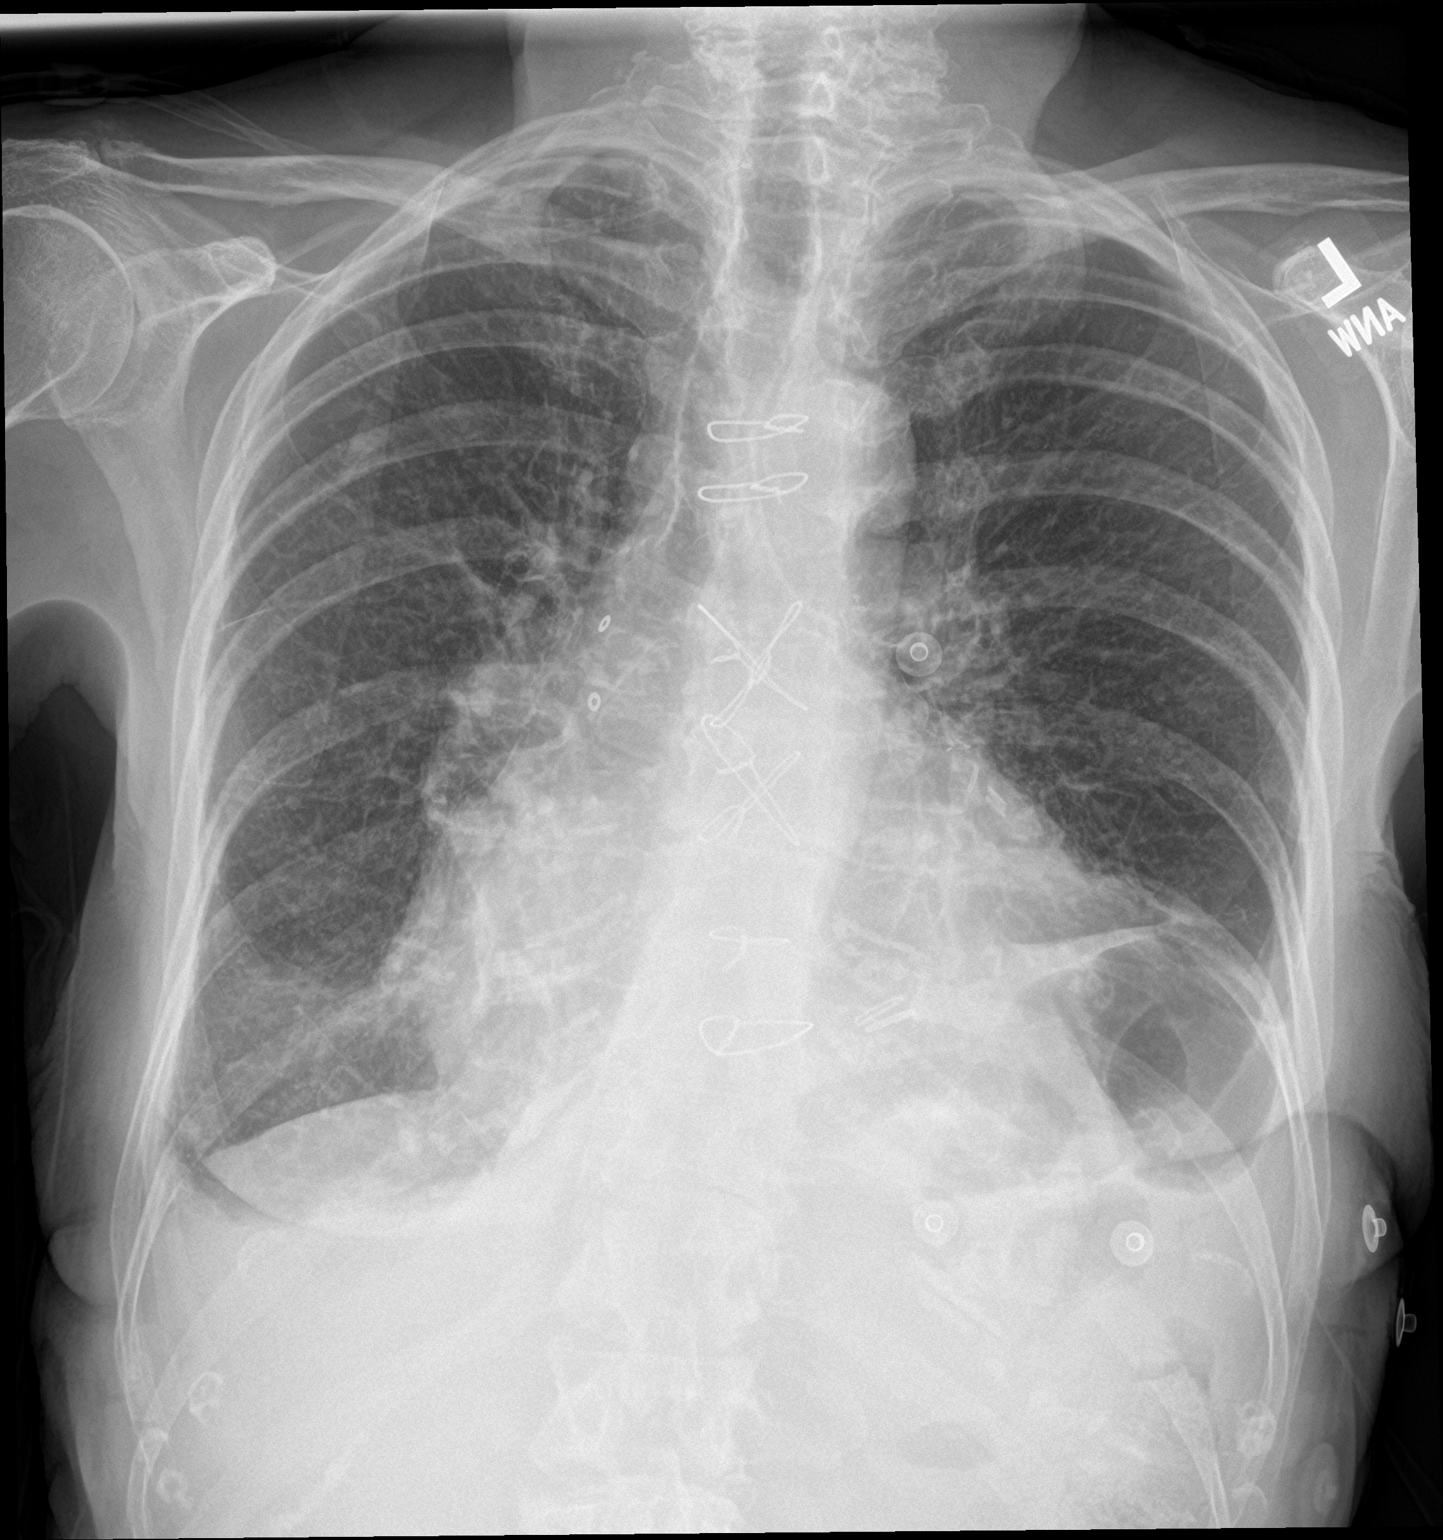

[chest lat]
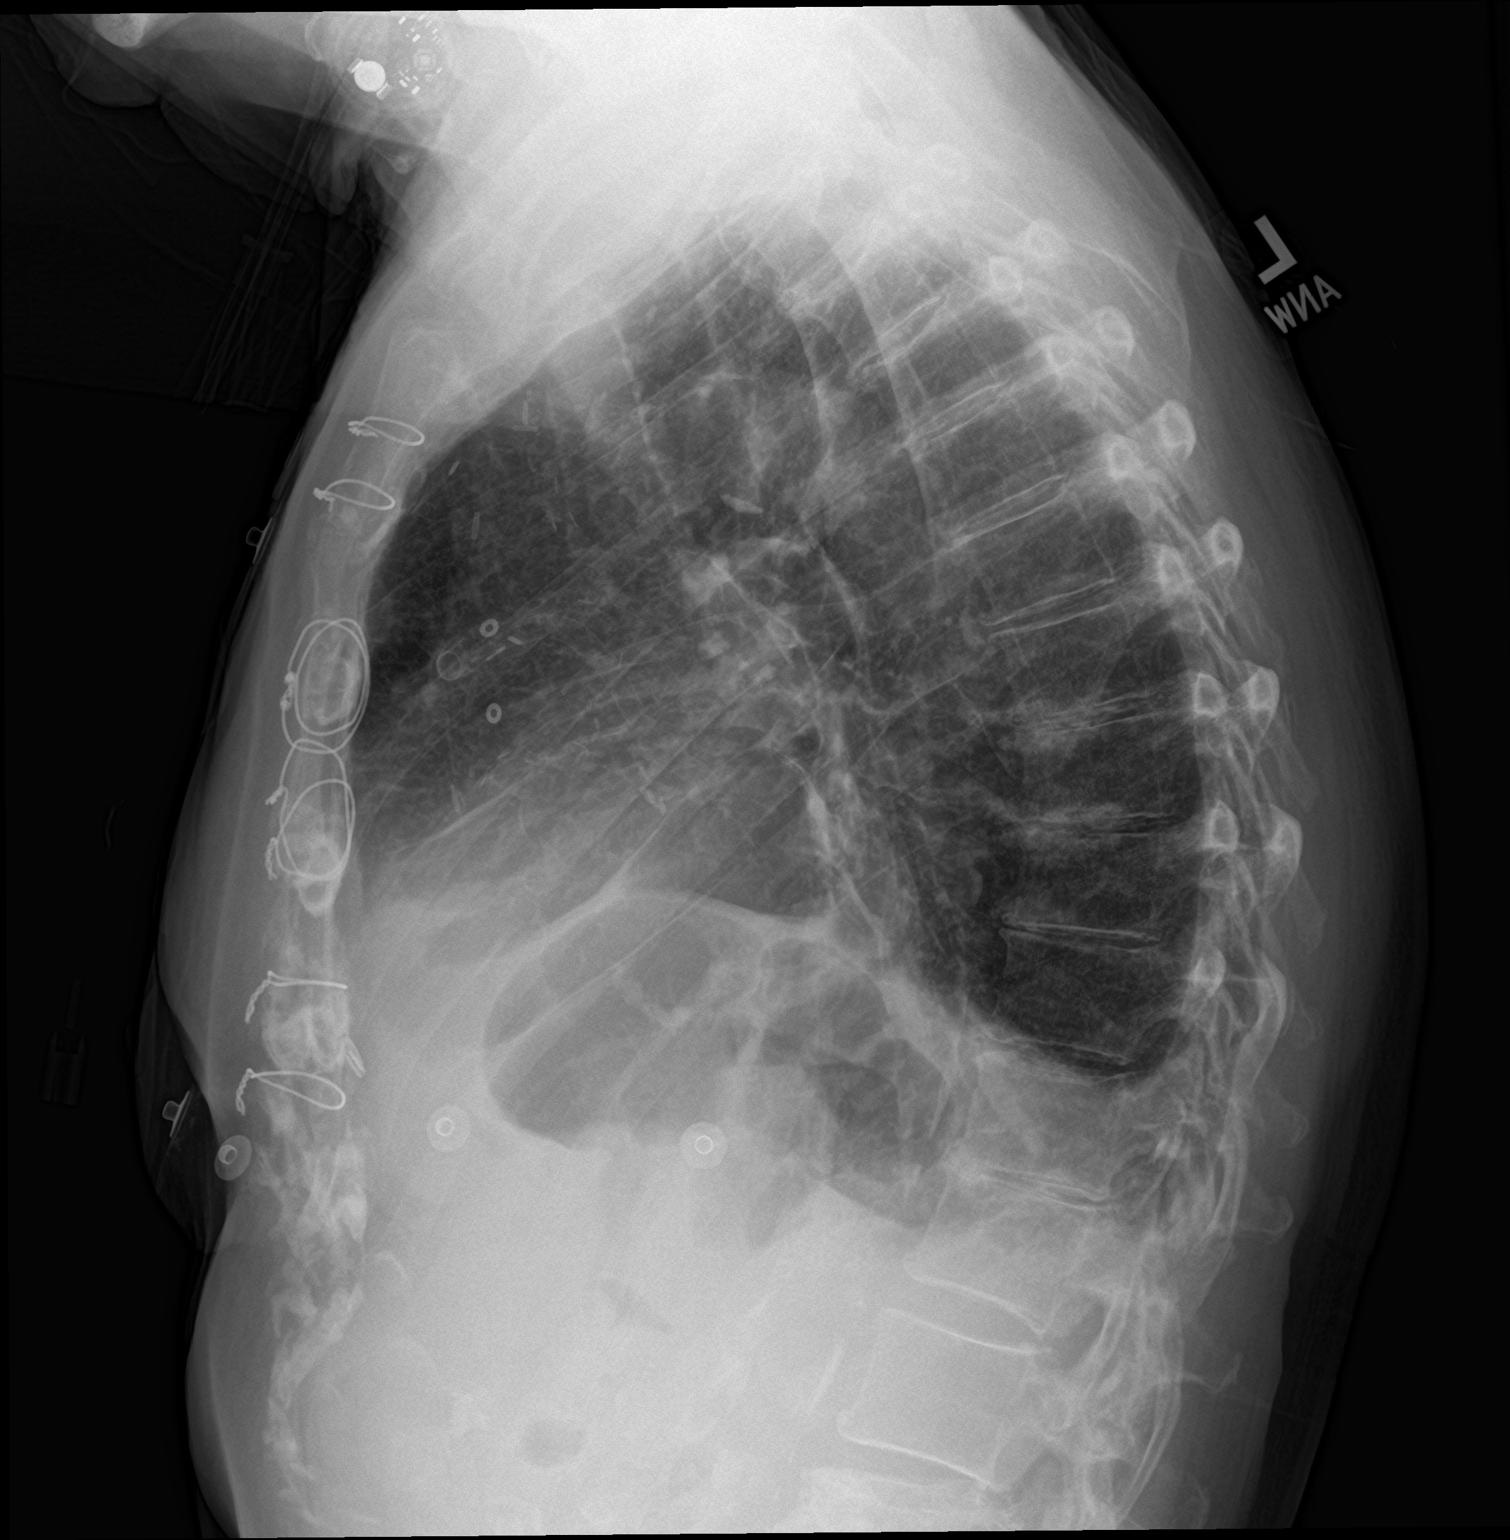

[2 of 2 positions shown; findings below may reference images not displayed]

FINDINGS: Cardiomegaly status post median sternotomy. Elevation of the left
hemidiaphragm. Probable small bilateral pleural effusions and/or
pleural thickening. Disc degenerative disease of the thoracic spine.
IMPRESSION: 1. Cardiomegaly.
2. Elevation of the left hemidiaphragm.
3. Probable small bilateral pleural effusions and/or pleural
thickening.
